# Patient Record
Sex: Female | Born: 1965 | Race: Black or African American | Hispanic: No | Smoking: Former smoker
Health system: Southern US, Community
[De-identification: ages and names within clinical notes are randomized; demographics above are authoritative.]

## PROBLEM LIST (undated history)

## (undated) DIAGNOSIS — I639 Cerebral infarction, unspecified: Secondary | ICD-10-CM

## (undated) DIAGNOSIS — G43909 Migraine, unspecified, not intractable, without status migrainosus: Secondary | ICD-10-CM

## (undated) DIAGNOSIS — M1990 Unspecified osteoarthritis, unspecified site: Secondary | ICD-10-CM

## (undated) DIAGNOSIS — Z8709 Personal history of other diseases of the respiratory system: Secondary | ICD-10-CM

## (undated) DIAGNOSIS — I219 Acute myocardial infarction, unspecified: Secondary | ICD-10-CM

## (undated) DIAGNOSIS — C539 Malignant neoplasm of cervix uteri, unspecified: Secondary | ICD-10-CM

## (undated) DIAGNOSIS — K259 Gastric ulcer, unspecified as acute or chronic, without hemorrhage or perforation: Secondary | ICD-10-CM

## (undated) DIAGNOSIS — G459 Transient cerebral ischemic attack, unspecified: Secondary | ICD-10-CM

## (undated) HISTORY — DX: Personal history of other diseases of the respiratory system: Z87.09

## (undated) HISTORY — DX: Migraine, unspecified, not intractable, without status migrainosus: G43.909

## (undated) HISTORY — DX: Malignant neoplasm of cervix uteri, unspecified: C53.9

## (undated) HISTORY — PX: ABDOMINAL HYSTERECTOMY: SHX81

## (undated) HISTORY — DX: Unspecified osteoarthritis, unspecified site: M19.90

## (undated) HISTORY — DX: Gastric ulcer, unspecified as acute or chronic, without hemorrhage or perforation: K25.9

## (undated) HISTORY — PX: TUBAL LIGATION: SHX77

## (undated) HISTORY — DX: Transient cerebral ischemic attack, unspecified: G45.9

---

## 2003-08-03 ENCOUNTER — Other Ambulatory Visit: Payer: Self-pay

## 2005-09-05 ENCOUNTER — Emergency Department: Payer: Self-pay | Admitting: Emergency Medicine

## 2005-11-29 ENCOUNTER — Emergency Department: Payer: Self-pay | Admitting: Emergency Medicine

## 2006-05-24 ENCOUNTER — Other Ambulatory Visit: Payer: Self-pay

## 2006-05-25 ENCOUNTER — Observation Stay: Payer: Self-pay | Admitting: Internal Medicine

## 2006-05-31 HISTORY — PX: OTHER SURGICAL HISTORY: SHX169

## 2007-10-20 ENCOUNTER — Other Ambulatory Visit: Payer: Self-pay

## 2007-10-20 ENCOUNTER — Emergency Department: Payer: Self-pay | Admitting: Emergency Medicine

## 2008-03-08 ENCOUNTER — Other Ambulatory Visit: Payer: Self-pay

## 2008-03-08 ENCOUNTER — Emergency Department: Payer: Self-pay | Admitting: Emergency Medicine

## 2008-04-04 ENCOUNTER — Emergency Department: Payer: Self-pay | Admitting: Internal Medicine

## 2008-10-13 ENCOUNTER — Emergency Department: Payer: Self-pay | Admitting: Unknown Physician Specialty

## 2008-12-10 ENCOUNTER — Emergency Department: Payer: Self-pay | Admitting: Unknown Physician Specialty

## 2009-04-30 ENCOUNTER — Inpatient Hospital Stay: Payer: Self-pay | Admitting: *Deleted

## 2009-05-06 ENCOUNTER — Emergency Department: Payer: Self-pay | Admitting: Emergency Medicine

## 2009-09-01 ENCOUNTER — Emergency Department: Payer: Self-pay | Admitting: Internal Medicine

## 2009-09-04 ENCOUNTER — Emergency Department: Payer: Self-pay | Admitting: Emergency Medicine

## 2009-11-24 ENCOUNTER — Observation Stay: Payer: Self-pay | Admitting: Internal Medicine

## 2009-12-04 ENCOUNTER — Emergency Department: Payer: Self-pay | Admitting: Emergency Medicine

## 2009-12-17 ENCOUNTER — Ambulatory Visit: Payer: Self-pay | Admitting: Family Medicine

## 2009-12-17 LAB — LIPID PANEL
CHOLESTEROL: 292 mg/dL — AB (ref 0–200)
HDL: 49 mg/dL (ref 35–70)
LDL CALC: 207 mg/dL
Triglycerides: 179 mg/dL — AB (ref 40–160)

## 2009-12-17 LAB — HEPATIC FUNCTION PANEL: ALT: 22 U/L (ref 7–35)

## 2009-12-28 ENCOUNTER — Emergency Department: Payer: Self-pay | Admitting: Emergency Medicine

## 2010-02-21 ENCOUNTER — Emergency Department: Payer: Self-pay | Admitting: Emergency Medicine

## 2010-03-02 HISTORY — PX: CARPAL TUNNEL RELEASE: SHX101

## 2011-02-28 ENCOUNTER — Emergency Department: Payer: Self-pay | Admitting: *Deleted

## 2011-06-07 ENCOUNTER — Emergency Department: Payer: Self-pay | Admitting: Internal Medicine

## 2011-07-05 HISTORY — PX: CARPAL TUNNEL RELEASE: SHX101

## 2011-10-24 ENCOUNTER — Ambulatory Visit: Payer: Self-pay | Admitting: Family Medicine

## 2011-11-03 ENCOUNTER — Encounter: Payer: Self-pay | Admitting: Family Medicine

## 2011-12-03 ENCOUNTER — Encounter: Payer: Self-pay | Admitting: Family Medicine

## 2012-01-02 ENCOUNTER — Encounter: Payer: Self-pay | Admitting: Family Medicine

## 2012-04-18 ENCOUNTER — Emergency Department: Payer: Self-pay | Admitting: Emergency Medicine

## 2012-05-07 ENCOUNTER — Emergency Department: Payer: Self-pay | Admitting: Emergency Medicine

## 2013-01-06 ENCOUNTER — Emergency Department: Payer: Self-pay | Admitting: Emergency Medicine

## 2013-06-03 ENCOUNTER — Emergency Department: Payer: Self-pay | Admitting: Emergency Medicine

## 2013-07-16 ENCOUNTER — Encounter: Payer: Self-pay | Admitting: Pulmonary Disease

## 2013-07-16 ENCOUNTER — Ambulatory Visit (INDEPENDENT_AMBULATORY_CARE_PROVIDER_SITE_OTHER): Payer: BC Managed Care – PPO | Admitting: Pulmonary Disease

## 2013-07-16 VITALS — BP 128/82 | HR 75 | Temp 98.0°F | Ht 61.0 in | Wt 255.0 lb

## 2013-07-16 DIAGNOSIS — J309 Allergic rhinitis, unspecified: Secondary | ICD-10-CM | POA: Insufficient documentation

## 2013-07-16 DIAGNOSIS — K219 Gastro-esophageal reflux disease without esophagitis: Secondary | ICD-10-CM | POA: Insufficient documentation

## 2013-07-16 DIAGNOSIS — J45909 Unspecified asthma, uncomplicated: Secondary | ICD-10-CM | POA: Insufficient documentation

## 2013-07-16 MED ORDER — LANSOPRAZOLE 30 MG PO CPDR: 30.0000 mg | DELAYED_RELEASE_CAPSULE | Freq: Every day | ORAL | Status: DC

## 2013-07-16 MED ORDER — MOMETASONE FUROATE 50 MCG/ACT NA SUSP: 2.0000 | Freq: Every day | NASAL | Status: DC

## 2013-07-16 NOTE — Progress Notes (Signed)
Subjective:    Patient ID: Erin Sherman, female    DOB: 08-25-1965, 48 y.o.   MRN: [id number]  HPI  Erin Sherman is here because of some asthma.  She has been having a lot of time out from work lately because of her asthma problems.  She has been dealing with asthma for years but it has been worse since moving to [place].  She has been having more flare ups in the last year or so.  She has been seen by an allergist since moving here and was told that she had a lot of allergies to grass and trees.  She has been living in [place] for twenty years or more.    She had mild asthma as a child but no other allergies.  By age 66 she started having more symptoms causing her to receive more regular treatment for her asthma.    She lives in an apartment for two years.  No mold mildew or excessive dust in the appartment.  When she was living in a trailer she had much worse symptoms due to mold.   She has a lot of sneezing due to sinus congestion.   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    She has been hospitalized for asthma 5 years ago and had to stay a week.   She knows that she has asthma triggers such as cold weather, changing weather, fresh grass cut will make her more short of breath.  She has had multiple flares of her asthma in the past year, probably 5-6 times a year.  She gets treated with steroids and this has made her gain a lot of weight.    She used to smoke, 1/3 pack per day off and on for 20 years and she quit 5 years ago.    She takes symbicort, one puff twice a day.  When she takes two puffs it helps a little more.  She uses albuterol probably 3 times a day.  She doesn't have to use it at night. She has had nighttime symptoms in the past but that hasn't been a problem lately.   Past Medical History  Diagnosis Date  . Asthma   . Hx of bronchitis   . Sinus drainage   . Arthritis   . Bursitis   . Migraines   . Stomach ulcer       Family History  Problem Relation Age of Onset  . Emphysema Mother   . Emphysema Father   . Asthma Son   . Asthma Sister   . Cancer Maternal Grandfather     prostate  . Cancer Maternal Aunt     breast     History   Social History  . Marital Status: Divorced    Spouse Name: N/A    Number of Children: N/A  . Years of Education: N/A   Occupational History  . Not on file.   Social History Main Topics  . Smoking status: Former Smoker -- 0.30 packs/day for 25 years    Types: Cigarettes    Quit date: 07/16/2008  . Smokeless tobacco: Never Used  . Alcohol Use: Yes     Comment: occasional beer.  . Drug Use: No  . Sexual Activity: Not on file   Other Topics Concern  . Not on file   Social History Narrative  . No narrative on file     Allergies  Allergen Reactions  . Latex  swelling  . Sulfa Antibiotics     Hives, itching     No outpatient prescriptions prior to visit.   No facility-administered medications prior to visit.      Review of Systems  Constitutional: Positive for fatigue. Negative for fever and unexpected weight change.  HENT: Positive for postnasal drip and sinus pressure. Negative for congestion, dental problem, ear pain, nosebleeds, rhinorrhea, sneezing, sore throat and trouble swallowing.   Eyes: Negative for redness and itching.  Respiratory: Positive for chest tightness, shortness of breath and wheezing. Negative for cough.   Cardiovascular: Negative for palpitations and leg swelling.  Gastrointestinal: Negative for nausea and vomiting.  Genitourinary: Negative for dysuria.  Musculoskeletal: Negative for joint swelling.  Skin: Negative for rash.  Neurological: Negative for headaches.  Hematological: Does not bruise/bleed easily.  Psychiatric/Behavioral: Negative for dysphoric mood. The patient is not nervous/anxious.        Objective:   Physical Exam  Filed Vitals:   07/16/13 1629  BP: 128/82  Pulse: 75  Temp: 98 F (36.7 C)   TempSrc: Oral  Height: 5\' 1"  (1.549 m)  Weight: 255 lb (115.667 kg)  SpO2: 97%  RA  Ambulated 500 feet on RA and did not drop O2 saturation below 95%  Gen: well appearing, obese, no acute distress HEENT: NCAT, PERRL, EOMi, OP clear, neck supple without masses PULM: CTA B CV: RRR, no mgr, no JVD AB: BS+, soft, nontender, no hsm Ext: warm, no edema, no clubbing, no cyanosis Derm: no rash or skin breakdown Neuro: A&Ox4, CN II-XII intact, strength 5/5 in all 4 extremities  06/2013 CXR reviewed> no parenchymal abnormality     Assessment & Plan:   GERD (gastroesophageal reflux disease) Her Acid reflux is definitely contributing to the severity of her asthma.  Plan: -change from protonix to prevacid -GERD lifestyle modification reviewed  Asthma, chronic It sounds like Erin Sherman's asthma is at least moderate persistent given her frequent exacerbations, ER visits and frequent albuterol use.  I explained to her that I worry that her poor control is due to any of the following: poor HFA technique, GERD, allergic rhinitis, obesity and possibly or an underlying allergen.  Her acid reflux is definitely contributing to her asthma and has worsened since changing from prevacid to protonix.  Plan: -full PFT pre-post bronchodilator -I educated her today on proper HFA technique -use Symbicort 2 puffs bid -use Symbicort with a spacer -see allergic rhinitis -see GERD -check IgE panel   Allergic rhinitis This is also contributing to her asthma severity.  Plan: -add nasonex, instructed on proper use -add generic Zyrtec   Updated Medication List Outpatient Encounter Prescriptions as of 07/16/2013  Medication Sig  . cholecalciferol (VITAMIN D) 1000 UNITS tablet Take 1,000 Units by mouth daily.  . meloxicam (MOBIC) 15 MG tablet Take 15 mg by mouth as needed.  . montelukast (SINGULAIR) 10 MG tablet Take 10 mg by mouth daily.  . Multiple Vitamins-Minerals (MULTIVITAMIN & MINERAL PO)  Take 1 tablet by mouth daily.  . SYMBICORT 160-4.5 MCG/ACT inhaler Inhale 2 puffs into the lungs 2 (two) times daily.  . [DISCONTINUED] pantoprazole (PROTONIX) 40 MG tablet Take 40 mg by mouth daily.  . lansoprazole (PREVACID) 30 MG capsule Take 1 capsule (30 mg total) by mouth daily.  . mometasone (NASONEX) 50 MCG/ACT nasal spray Place 2 sprays into the nose daily.

## 2013-07-16 NOTE — Assessment & Plan Note (Signed)
It sounds like Erin Sherman's asthma is at least moderate persistent given her frequent exacerbations, ER visits and frequent albuterol use.  I explained to her that I worry that her poor control is due to any of the following: poor HFA technique, GERD, allergic rhinitis, obesity and possibly or an underlying allergen.  Her acid reflux is definitely contributing to her asthma and has worsened since changing from prevacid to protonix.  Plan: -full PFT pre-post bronchodilator -I educated her today on proper HFA technique -use Symbicort 2 puffs bid -use Symbicort with a spacer -see allergic rhinitis -see GERD -check IgE panel

## 2013-07-16 NOTE — Patient Instructions (Addendum)
For your asthma: -use your symbicort with a spacer, 2 puffs twice a day -continue using your albuterol as you are doing -we will set up lung function testing at the hospital  For your allergic rhinitis: -use nasonex 2 sprays each nostril once a day -use generic zyrtec daily  For your GERD: -follow the lifestyle modification instructions we gave you -change the protonix to prevacid (prescription sent)  We will see you back in 6-8 weeks or sooner if needed

## 2013-07-16 NOTE — Assessment & Plan Note (Addendum)
Her Acid reflux is definitely contributing to the severity of her asthma.  Plan: -change from protonix to prevacid -GERD lifestyle modification reviewed

## 2013-07-16 NOTE — Assessment & Plan Note (Signed)
This is also contributing to her asthma severity.  Plan: -add nasonex, instructed on proper use -add generic Zyrtec

## 2013-07-25 ENCOUNTER — Ambulatory Visit: Payer: Self-pay | Admitting: Pulmonary Disease

## 2013-07-25 LAB — PULMONARY FUNCTION TEST

## 2013-07-26 ENCOUNTER — Encounter: Payer: Self-pay | Admitting: Pulmonary Disease

## 2013-07-29 ENCOUNTER — Telehealth: Payer: Self-pay

## 2013-07-29 NOTE — Telephone Encounter (Signed)
LMTCB X1 

## 2013-07-29 NOTE — Telephone Encounter (Signed)
Message copied by Len Blalock on Mon Jul 29, 2013  1:46 PM ------      Message from: Juanito Doom      Created: Fri Jul 26, 2013  8:08 PM       A,            Please let her know that her PFTs looked like asthma            Thanks      b ------

## 2013-08-05 NOTE — Telephone Encounter (Signed)
ATC provided number #[phone], call could not be completed.  LMTCB on home # [id number]

## 2013-08-05 NOTE — Telephone Encounter (Signed)
Returning call can be reached at [phone].Erin Sherman

## 2013-08-05 NOTE — Telephone Encounter (Signed)
LMTCB X2

## 2013-08-26 ENCOUNTER — Encounter: Payer: Self-pay | Admitting: Pulmonary Disease

## 2013-12-22 ENCOUNTER — Emergency Department: Payer: Self-pay | Admitting: Emergency Medicine

## 2013-12-22 LAB — BASIC METABOLIC PANEL
Anion Gap: 7 (ref 7–16)
BUN: 12 mg/dL (ref 7–18)
CALCIUM: 9.2 mg/dL (ref 8.5–10.1)
CREATININE: 0.87 mg/dL (ref 0.60–1.30)
Chloride: 101 mmol/L (ref 98–107)
Co2: 30 mmol/L (ref 21–32)
EGFR (African American): >60
EGFR (Non-African Amer.): >60
Glucose: 95 mg/dL (ref 65–99)
OSMOLALITY: 275 (ref 275–301)
Potassium: 3.8 mmol/L (ref 3.5–5.1)
Sodium: 138 mmol/L (ref 136–145)

## 2013-12-22 LAB — TROPONIN I: Troponin-I: <0.02 ng/mL

## 2013-12-22 LAB — CBC
HCT: 35.7 % (ref 35.0–47.0)
HGB: 11.6 g/dL — AB (ref 12.0–16.0)
MCH: 27.4 pg (ref 26.0–34.0)
MCHC: 32.5 g/dL (ref 32.0–36.0)
MCV: 84 fL (ref 80–100)
Platelet: 292 10*3/uL (ref 150–440)
RBC: 4.23 10*6/uL (ref 3.80–5.20)
RDW: 12.9 % (ref 11.5–14.5)
WBC: 10.1 10*3/uL (ref 3.6–11.0)

## 2013-12-22 LAB — MAGNESIUM: Magnesium: 2.1 mg/dL

## 2013-12-27 ENCOUNTER — Emergency Department: Payer: Self-pay | Admitting: Internal Medicine

## 2014-02-17 ENCOUNTER — Ambulatory Visit: Payer: Self-pay | Admitting: Family Medicine

## 2014-06-19 ENCOUNTER — Emergency Department: Payer: Self-pay | Admitting: Emergency Medicine

## 2014-06-19 LAB — TROPONIN I: Troponin-I: <0.02 ng/mL

## 2014-06-19 LAB — CBC
HCT: 37.2 % (ref 35.0–47.0)
HGB: 11.9 g/dL — AB (ref 12.0–16.0)
MCH: 26.9 pg (ref 26.0–34.0)
MCHC: 32 g/dL (ref 32.0–36.0)
MCV: 84 fL (ref 80–100)
Platelet: 285 10*3/uL (ref 150–440)
RBC: 4.42 10*6/uL (ref 3.80–5.20)
RDW: 12.9 % (ref 11.5–14.5)
WBC: 8.9 10*3/uL (ref 3.6–11.0)

## 2014-06-19 LAB — BASIC METABOLIC PANEL
Anion Gap: 8 (ref 7–16)
BUN: 12 mg/dL (ref 7–18)
Calcium, Total: 9.1 mg/dL (ref 8.5–10.1)
Chloride: 104 mmol/L (ref 98–107)
Co2: 27 mmol/L (ref 21–32)
Creatinine: 0.77 mg/dL (ref 0.60–1.30)
EGFR (African American): >60
EGFR (Non-African Amer.): >60
Glucose: 96 mg/dL (ref 65–99)
Osmolality: 277 (ref 275–301)
Potassium: 3.5 mmol/L (ref 3.5–5.1)
SODIUM: 139 mmol/L (ref 136–145)

## 2014-09-30 ENCOUNTER — Emergency Department: Payer: Self-pay | Admitting: Student

## 2014-09-30 ENCOUNTER — Emergency Department: Payer: Self-pay | Admitting: Emergency Medicine

## 2014-09-30 LAB — BASIC METABOLIC PANEL
Anion Gap: 9 (ref 7–16)
BUN: 16 mg/dL
CALCIUM: 9.2 mg/dL
Chloride: 102 mmol/L
Co2: 26 mmol/L
Creatinine: 0.7 mg/dL
Glucose: 156 mg/dL — ABNORMAL HIGH
Potassium: 4.5 mmol/L
Sodium: 137 mmol/L

## 2014-09-30 LAB — CBC
HCT: 36 % (ref 35.0–47.0)
HGB: 11.7 g/dL — ABNORMAL LOW (ref 12.0–16.0)
MCH: 27.1 pg (ref 26.0–34.0)
MCHC: 32.4 g/dL (ref 32.0–36.0)
MCV: 84 fL (ref 80–100)
PLATELETS: 270 10*3/uL (ref 150–440)
RBC: 4.31 10*6/uL (ref 3.80–5.20)
RDW: 13.4 % (ref 11.5–14.5)
WBC: 7.1 10*3/uL (ref 3.6–11.0)

## 2014-09-30 LAB — PRO B NATRIURETIC PEPTIDE: B-TYPE NATIURETIC PEPTID: 27 pg/mL

## 2014-09-30 LAB — TROPONIN I: Troponin-I: <0.03 ng/mL

## 2014-10-01 LAB — TROPONIN I

## 2015-05-07 DIAGNOSIS — E669 Obesity, unspecified: Secondary | ICD-10-CM | POA: Insufficient documentation

## 2015-05-07 DIAGNOSIS — D4959 Neoplasm of unspecified behavior of other genitourinary organ: Secondary | ICD-10-CM | POA: Insufficient documentation

## 2015-05-07 DIAGNOSIS — R110 Nausea: Secondary | ICD-10-CM | POA: Insufficient documentation

## 2015-05-07 DIAGNOSIS — M25559 Pain in unspecified hip: Secondary | ICD-10-CM | POA: Insufficient documentation

## 2015-05-07 DIAGNOSIS — G5600 Carpal tunnel syndrome, unspecified upper limb: Secondary | ICD-10-CM | POA: Insufficient documentation

## 2015-05-07 DIAGNOSIS — Z8541 Personal history of malignant neoplasm of cervix uteri: Secondary | ICD-10-CM | POA: Insufficient documentation

## 2015-05-07 DIAGNOSIS — R32 Unspecified urinary incontinence: Secondary | ICD-10-CM | POA: Insufficient documentation

## 2015-05-07 DIAGNOSIS — E785 Hyperlipidemia, unspecified: Secondary | ICD-10-CM | POA: Insufficient documentation

## 2015-05-07 DIAGNOSIS — N951 Menopausal and female climacteric states: Secondary | ICD-10-CM | POA: Insufficient documentation

## 2015-05-08 ENCOUNTER — Ambulatory Visit: Payer: Self-pay | Admitting: Family Medicine

## 2015-05-15 ENCOUNTER — Encounter: Payer: Self-pay | Admitting: Family Medicine

## 2015-05-15 ENCOUNTER — Ambulatory Visit (INDEPENDENT_AMBULATORY_CARE_PROVIDER_SITE_OTHER): Payer: BC Managed Care – PPO | Admitting: Family Medicine

## 2015-05-15 VITALS — BP 104/58 | HR 99 | Temp 98.6°F | Resp 16 | Wt 258.0 lb

## 2015-05-15 DIAGNOSIS — J4540 Moderate persistent asthma, uncomplicated: Secondary | ICD-10-CM

## 2015-05-15 DIAGNOSIS — K219 Gastro-esophageal reflux disease without esophagitis: Secondary | ICD-10-CM

## 2015-05-15 DIAGNOSIS — Z23 Encounter for immunization: Secondary | ICD-10-CM | POA: Diagnosis not present

## 2015-05-15 DIAGNOSIS — M25551 Pain in right hip: Secondary | ICD-10-CM

## 2015-05-15 DIAGNOSIS — J309 Allergic rhinitis, unspecified: Secondary | ICD-10-CM

## 2015-05-15 DIAGNOSIS — M51369 Other intervertebral disc degeneration, lumbar region without mention of lumbar back pain or lower extremity pain: Secondary | ICD-10-CM | POA: Insufficient documentation

## 2015-05-15 DIAGNOSIS — M5136 Other intervertebral disc degeneration, lumbar region: Secondary | ICD-10-CM | POA: Insufficient documentation

## 2015-05-15 MED ORDER — BUDESONIDE-FORMOTEROL FUMARATE 160-4.5 MCG/ACT IN AERO: 2.0000 | INHALATION_SPRAY | Freq: Two times a day (BID) | RESPIRATORY_TRACT | Status: DC

## 2015-05-15 MED ORDER — ALBUTEROL SULFATE HFA 108 (90 BASE) MCG/ACT IN AERS: 2.0000 | INHALATION_SPRAY | Freq: Four times a day (QID) | RESPIRATORY_TRACT | Status: DC | PRN

## 2015-05-15 MED ORDER — CEPHALEXIN 500 MG PO CAPS: 500.0000 mg | ORAL_CAPSULE | Freq: Four times a day (QID) | ORAL | Status: AC

## 2015-05-15 MED ORDER — HYDROCODONE-ACETAMINOPHEN 7.5-325 MG PO TABS: 1.0000 | ORAL_TABLET | Freq: Four times a day (QID) | ORAL | Status: DC | PRN

## 2015-05-15 MED ORDER — MOMETASONE FUROATE 50 MCG/ACT NA SUSP: 2.0000 | Freq: Every day | NASAL | Status: DC

## 2015-05-15 NOTE — Progress Notes (Signed)
Patient: Erin Sherman Female    DOB: 1966-06-03   49 y.o.   MRN: [id number] Visit Date: 05/15/2015  Today's Provider: Lelon Huh, MD   Chief Complaint  Patient presents with  . Leg Pain  . Hip Pain  . Asthma   Lump on abdomen  . Allergies   Subjective:    HPI Complains of persistent low back pain radiating into right leg and hip. She had x-rays in 2013 showing DDD. She has been prescribed hydrocodone/apap which she states helps quite a bit, but only needs to take periodiclly.   Asthma follow up: She continues to use Symbicort daily with occasional use of nebulizer and albuterol inhaler. She has occasional flare ups causing her to miss several days of work at a time. She had frequent absences last year and was referred to Dr. Lake Bells, but she feels her asthma is more stable now and does not want to return to pulmonologist. She is due to have FMLA papers completed which she will bring back to office. She needs refills sent to her pharmacy  Allergic rhinitis She has been having much more nasal congestion the last several weeks with post nasal drainage. She has been using OTC nasal decongestants. She was previously prescribed Nasonex which she does not have anymore.   Lump: She reports tender lump draining small amounts of yellow material lower abdomen the last 4-5 days.   Allergies  Allergen Reactions  . Latex     swelling  . Sulfa Antibiotics     Hives, itching   Previous Medications   ALBUTEROL (PROVENTIL HFA) 108 (90 BASE) MCG/ACT INHALER    Inhale 2 puffs into the lungs every 6 (six) hours as needed.   BLACK COHOSH PO    Take by mouth daily.   CETIRIZINE (ZYRTEC) 10 MG TABLET    Take 1 tablet by mouth daily.   CHOLECALCIFEROL (VITAMIN D) 1000 UNITS TABLET    Take 1,000 Units by mouth daily.   EPINEPHRINE (EPIPEN 2-PAK) 0.3 MG/0.3 ML IJ SOAJ INJECTION       HYDROCODONE-ACETAMINOPHEN (NORCO) 7.5-325 MG TABLET    Take 1 tablet by mouth every 6 (six) hours  as needed.   IPRATROPIUM-ALBUTEROL (DUONEB) 0.5-2.5 (3) MG/3ML SOLN    Inhale 1 continuous puffing into the lungs every 6 (six) hours as needed.   LANSOPRAZOLE (PREVACID) 30 MG CAPSULE    Take 1 capsule (30 mg total) by mouth daily.   MELOXICAM (MOBIC) 15 MG TABLET    Take 15 mg by mouth as needed.   MOMETASONE (NASONEX) 50 MCG/ACT NASAL SPRAY    Place 2 sprays into the nose daily.   MONTELUKAST (SINGULAIR) 10 MG TABLET    Take 10 mg by mouth daily.   MULTIPLE VITAMINS-MINERALS (MULTIVITAMIN & MINERAL PO)    Take 1 tablet by mouth daily.   NAPROXEN (NAPROSYN) 500 MG TABLET    Take 1 tablet by mouth 2 (two) times daily as needed.   PANTOPRAZOLE (PROTONIX) 40 MG TABLET    Take 1 tablet by mouth daily.   PROMETHAZINE (PHENERGAN) 25 MG TABLET    Take 1 tablet by mouth every 8 (eight) hours as needed. For nausea   SYMBICORT 160-4.5 MCG/ACT INHALER    Inhale 2 puffs into the lungs 2 (two) times daily.    Review of Systems  Constitutional: Negative.   HENT: Positive for congestion.   Eyes: Negative.   Respiratory: Negative.   Cardiovascular: Negative.   Gastrointestinal:  Negative.   Endocrine: Negative.   Genitourinary: Negative.   Musculoskeletal: Positive for myalgias, back pain, arthralgias, neck pain and neck stiffness.  Allergic/Immunologic: Negative.   Neurological: Negative.   Hematological: Negative.   Psychiatric/Behavioral: Positive for sleep disturbance.    Social History  Substance Use Topics  . Smoking status: Former Smoker -- 0.30 packs/day for 25 years    Types: Cigarettes    Quit date: 07/16/2008  . Smokeless tobacco: Never Used  . Alcohol Use: Yes     Comment: occasional beer.   Objective:   BP 104/58 mmHg  Pulse 99  Temp(Src) 98.6 F (37 C) (Oral)  Resp 16  Wt 258 lb (117.028 kg)  SpO2 97%   Depression screen PHQ 2/9 05/15/2015  Decreased Interest 0  Down, Depressed, Hopeless 0  PHQ - 2 Score 0  Altered sleeping 2  Tired, decreased energy 2  Change in  appetite 0  Feeling bad or failure about yourself  0  Moving slowly or fidgety/restless 0  Suicidal thoughts 0  PHQ-9 Score 4  Difficult doing work/chores Not difficult at all      Physical Exam  General Appearance:    Alert, cooperative, no distress, morbidly obese  HENT:   bilateral TM normal without fluid or infection, neck without nodes, throat normal without erythema or exudate, sinuses nontender, post nasal drip noted and nasal mucosa pale and congested  Eyes:    PERRL, conjunctiva/corneas clear, EOM's intact       Lungs:     Clear to auscultation bilaterally, respirations unlabored  Heart:    Regular rate and rhythm  Neurologic:   Awake, alert, oriented x 3. No apparent focal neurological           defect. No somatosensory deficits.   MS:   Tender lower lumbar spine and right trochanter.        Assessment & Plan:     1. Asthma, chronic, moderate persistent, uncomplicated Stable. Continue current medications. She prefers to get refills here which I agreed to since she has been stable. She will bring back FMLA forms to complete when she gets them from work - albuterol (PROVENTIL HFA) 108 (90 BASE) MCG/ACT inhaler; Inhale 2 puffs into the lungs every 6 (six) hours as needed.  Dispense: 18 g; Refill: 6 - budesonide-formoterol (SYMBICORT) 160-4.5 MCG/ACT inhaler; Inhale 2 puffs into the lungs 2 (two) times daily.  Dispense: 1 Inhaler; Refill: 12  2. Allergic rhinitis, unspecified allergic rhinitis type Start back on nasonex which she states has been effective in the past.  - mometasone (NASONEX) 50 MCG/ACT nasal spray; Place 2 sprays into the nose daily.  Dispense: 17 g; Refill: 2  3. Hip pain, right Tender over greater trochanter. She states hydrocodone works fairly well which was refilled today. Consider steroid injection  4. Gastroesophageal reflux disease without esophagitis Doing well with pantoprazole. Continue current medications.    5. DDD (degenerative disc disease),  lumbar Get back on hydrocodone. Consider referral to physiatry - HYDROcodone-acetaminophen ([place]) 7.5-325 MG tablet; Take 1 tablet by mouth every 6 (six) hours as needed.  Dispense: 30 tablet; Refill: 0  6. Need for pneumococcal vaccination  - Pneumococcal polysaccharide vaccine 23-valent greater than or equal to 2yo subcutaneous/IM   7. Subcutaneous abscess lower abdominal wall - cephalexin 500mg  one four times daily for 10 days Call if symptoms change or if not rapidly improving.   Addressed extensive list of chronic and acute medical problems today requiring extensive time in counseling and  coordination care.  Over half of this 45 minute visit were spent in counseling and coordinating care of multiple medical problems.        Lelon Huh, MD  [facility]

## 2015-05-24 ENCOUNTER — Emergency Department
Admission: EM | Admit: 2015-05-24 | Discharge: 2015-05-24 | Disposition: A | Discharge status: Home / Self Care | Payer: BC Managed Care – PPO | Attending: Emergency Medicine | Admitting: Emergency Medicine

## 2015-05-24 ENCOUNTER — Encounter: Payer: Self-pay | Admitting: Emergency Medicine

## 2015-05-24 DIAGNOSIS — J4541 Moderate persistent asthma with (acute) exacerbation: Secondary | ICD-10-CM

## 2015-05-24 DIAGNOSIS — Z79899 Other long term (current) drug therapy: Secondary | ICD-10-CM | POA: Diagnosis not present

## 2015-05-24 DIAGNOSIS — Z91040 Latex allergy status: Secondary | ICD-10-CM | POA: Diagnosis not present

## 2015-05-24 DIAGNOSIS — Z7951 Long term (current) use of inhaled steroids: Secondary | ICD-10-CM | POA: Diagnosis not present

## 2015-05-24 DIAGNOSIS — R0602 Shortness of breath: Secondary | ICD-10-CM | POA: Diagnosis present

## 2015-05-24 DIAGNOSIS — Z87891 Personal history of nicotine dependence: Secondary | ICD-10-CM | POA: Insufficient documentation

## 2015-05-24 MED ORDER — LIDOCAINE HCL (PF) 1 % IJ SOLN
INTRAMUSCULAR | Status: AC
  Administered 2015-05-24: 5 mL
  Filled 2015-05-24: qty 5

## 2015-05-24 MED ORDER — IPRATROPIUM-ALBUTEROL 0.5-2.5 (3) MG/3ML IN SOLN
3.0000 mL | Freq: Once | RESPIRATORY_TRACT | Status: AC
  Administered 2015-05-24: 3 mL via RESPIRATORY_TRACT
  Filled 2015-05-24: qty 3

## 2015-05-24 MED ORDER — GUAIFENESIN 100 MG/5ML PO SYRP: 200.0000 mg | ORAL_SOLUTION | Freq: Three times a day (TID) | ORAL | Status: DC | PRN

## 2015-05-24 MED ORDER — PREDNISONE 20 MG PO TABS: ORAL_TABLET | ORAL | Status: DC

## 2015-05-24 MED ORDER — OPTICHAMBER ADVANTAGE MISC: 1.0000 | Freq: Once | Status: AC

## 2015-05-24 MED ORDER — PREDNISONE 20 MG PO TABS
60.0000 mg | ORAL_TABLET | Freq: Once | ORAL | Status: AC
  Administered 2015-05-24: 60 mg via ORAL
  Filled 2015-05-24: qty 3

## 2015-05-24 NOTE — Discharge Instructions (Signed)
Take prednisone as prescribed. Continue to use her albuterol nebulizer treatments if needed. Between, you may use the albuterol inhaler. Use a spacer. You may repeat the 2 puffs if needed. Albuterol wears off an approximately 4-6 hours. You may repeat the doses at that time as well if needed. Meanwhile, the steroid inhaler recently prescribed should be continued for that you feel the benefits soon. Follow-up with your regular doctor. Return to emergency department if you have worsening shortness of breath or if you have other urgent concerns.  Asthma, Adult Asthma is a condition of the lungs in which the airways tighten and narrow. Asthma can make it hard to breathe. Asthma cannot be cured, but medicine and lifestyle changes can help control it. Asthma may be started (triggered) by:  Animal skin flakes (dander).  Dust.  Cockroaches.  Pollen.  Mold.  Smoke.  Cleaning products.  Hair sprays or aerosol sprays.  Paint fumes or strong smells.  Cold air, weather changes, and winds.  Crying or laughing hard.  Stress.  Certain medicines or drugs.  Foods, such as dried fruit, potato chips, and sparkling grape juice.  Infections or conditions (colds, flu).  Exercise.  Certain medical conditions or diseases.  Exercise or tiring activities. HOME CARE   Take medicine as told by your doctor.  Use a peak flow meter as told by your doctor. A peak flow meter is a tool that measures how well the lungs are working.  Record and keep track of the peak flow meter's readings.  Understand and use the asthma action plan. An asthma action plan is a written plan for taking care of your asthma and treating your attacks.  To help prevent asthma attacks:  Do not smoke. Stay away from secondhand smoke.  Change your heating and air conditioning filter often.  Limit your use of fireplaces and wood stoves.  Get rid of pests (such as roaches and mice) and their droppings.  Throw away plants  if you see mold on them.  Clean your floors. Dust regularly. Use cleaning products that do not smell.  Have someone vacuum when you are not home. Use a vacuum cleaner with a HEPA filter if possible.  Replace carpet with wood, tile, or vinyl flooring. Carpet can trap animal skin flakes and dust.  Use allergy-proof pillows, mattress covers, and box spring covers.  Wash bed sheets and blankets every week in hot water and dry them in a dryer.  Use blankets that are made of polyester or cotton.  Clean bathrooms and kitchens with bleach. If possible, have someone repaint the walls in these rooms with mold-resistant paint. Keep out of the rooms that are being cleaned and painted.  Wash hands often. GET HELP IF:  You have make a whistling sound when breaking (wheeze), have shortness of breath, or have a cough even if taking medicine to prevent attacks.  The colored mucus you cough up (sputum) is thicker than usual.  The colored mucus you cough up changes from clear or white to yellow, green, gray, or bloody.  You have problems from the medicine you are taking such as:  A rash.  Itching.  Swelling.  Trouble breathing.  You need reliever medicines more than 2-3 times a week.  Your peak flow measurement is still at 50-79% of your personal best after following the action plan for 1 hour.  You have a fever. GET HELP RIGHT AWAY IF:   You seem to be worse and are not responding to medicine during  an asthma attack.  You are short of breath even at rest.  You get short of breath when doing very little activity.  You have trouble eating, drinking, or talking.  You have chest pain.  You have a fast heartbeat.  Your lips or fingernails start to turn blue.  You are light-headed, dizzy, or faint.  Your peak flow is less than 50% of your personal best.   This information is not intended to replace advice given to you by your health care provider. Make sure you discuss any  questions you have with your health care provider.   Document Released: 12/07/2007 Document Revised: 03/11/2015 Document Reviewed: 01/17/2013 Elsevier Interactive Patient Education [employer].

## 2015-05-24 NOTE — ED Provider Notes (Signed)
[facility] Emergency Department Provider Note  ____________________________________________  Time seen: 1253   I have reviewed the triage vital signs and the nursing notes.   HISTORY  Chief Complaint Shortness of Breath     HPI Erin Sherman is a 49 y.o. female with history of asthma. Her symptoms have worsened over the past week. She went her primary physician approximately week ago and had a steroid inhaler added. She still feels tight and having shortness of breath. She uses her albuterol inhaler 2 puffs twice a day, which is likely an adequate for her current symptoms. With the steroid inhaler just having been prescribed, it is likely not therapeutic yet.  Patient denies any fever or chest pain. She does report ongoing tightness.  She reports having to use higher doses of steroids in the past and reports she needs higher doses then standard. She reluctantly is asking for 60 mg per day over the next few days and then a taper.   Past Medical History  Diagnosis Date  . Asthma   . Hx of bronchitis   . Sinus drainage   . Arthritis   . Bursitis   . Migraines   . Stomach ulcer     Patient Active Problem List   Diagnosis Date Noted  . DDD (degenerative disc disease), lumbar 05/15/2015  . Carpal tunnel syndrome 05/07/2015  . Hip pain 05/07/2015  . History of cervical cancer 05/07/2015  . HLD (hyperlipidemia) 05/07/2015  . Menopausal symptom 05/07/2015  . Obesity 05/07/2015  . 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 Urinary incontinence 05/07/2015  . Nausea 05/07/2015  . Asthma, chronic 07/16/2013  . Allergic rhinitis 07/16/2013  . GERD (gastroesophageal reflux disease) 07/16/2013    Past Surgical History  Procedure Laterality Date  . Carpal tunnel release Right 2013  . Cesarean section    . Carpal tunnel release  03/02/2010    Endoscopic carpal tunnel release. Rica Mast, MD (Orthopedic, [facility])  . Tubal ligation    . Abdominal hysterectomy      with unilateral oophorectomy and part of cervix removed due to cervical cancer  . Exercise treadmill test  05/31/2006    Normal    Current Outpatient Rx  Name  Route  Sig  Dispense  Refill  . albuterol (PROVENTIL HFA) 108 (90 BASE) MCG/ACT inhaler   Inhalation   Inhale 2 puffs into the lungs every 6 (six) hours as needed.   18 g   6   . BLACK COHOSH PO   Oral   Take by mouth daily.         . budesonide-formoterol (SYMBICORT) 160-4.5 MCG/ACT inhaler   Inhalation   Inhale 2 puffs into the lungs 2 (two) times daily.   1 Inhaler   12   . cetirizine (ZYRTEC) 10 MG tablet   Oral   Take 1 tablet by mouth daily.         . cholecalciferol (VITAMIN D) 1000 UNITS tablet   Oral   Take 1,000 Units by mouth daily.         Marland Kitchen EPINEPHrine (EPIPEN 2-PAK) 0.3 mg/0.3 mL IJ SOAJ injection               . HYDROcodone-acetaminophen (NORCO) 7.5-325 MG tablet   Oral   Take 1 tablet by mouth every 6 (six) hours as needed.   30 tablet   0   . ipratropium-albuterol (DUONEB) 0.5-2.5 (3) MG/3ML SOLN   Inhalation   Inhale 1 continuous puffing into the  lungs every 6 (six) hours as needed.         Marland Kitchen EXPIRED: lansoprazole (PREVACID) 30 MG capsule   Oral   Take 1 capsule (30 mg total) by mouth daily.   30 capsule   1   . meloxicam (MOBIC) 15 MG tablet   Oral   Take 15 mg by mouth as needed.         . mometasone (NASONEX) 50 MCG/ACT nasal spray   Nasal   Place 2 sprays into the nose daily.   17 g   2   . montelukast (SINGULAIR) 10 MG tablet   Oral   Take 10 mg by mouth daily.         . Multiple Vitamins-Minerals (MULTIVITAMIN & MINERAL PO)   Oral   Take 1 tablet by mouth daily.         . naproxen (NAPROSYN) 500 MG tablet   Oral   Take 1 tablet by mouth 2 (two) times daily as needed.         . pantoprazole (PROTONIX) 40 MG tablet   Oral   Take 1 tablet by mouth daily.         . predniSONE (DELTASONE) 20 MG tablet      Take 3 tablets a day for 2 days then  2 tablets a day for 3 days then 1 tablet a day for 3 days.   15 tablet   0   . promethazine (PHENERGAN) 25 MG tablet   Oral   Take 1 tablet by mouth every 8 (eight) hours as needed. For nausea         . Spacer/Aero-Holding Chambers (OPTICHAMBER ADVANTAGE) MISC   Other   1 each by Other route once. Always uses her when you're using a metered-dose inhaler. You've aromatase medicine as much, he won't have his much side effect, but you it twice as much medicine and your lungs.   1 each   0     Allergies Latex and Sulfa antibiotics  Family History  Problem Relation Age of Onset  . Emphysema Mother   . Asthma Mother   . Diabetes Mother     type 2  . COPD Mother   . Hypertension Mother   . Emphysema Father   . Hypertension Father   . COPD Father   . Asthma Son   . Asthma Sister   . Cancer Maternal Grandfather     prostate  . Cancer Maternal Aunt     breast  . Asthma Brother   . Heart attack Brother   . Heart attack Maternal Grandmother   . Heart attack Paternal Grandmother   . Anemia Sister   . Cancer Other     Social History Social History  Substance Use Topics  . Smoking status: Former Smoker -- 0.30 packs/day for 25 years    Types: Cigarettes    Quit date: 07/16/2008  . Smokeless tobacco: Never Used  . Alcohol Use: Yes     Comment: occasional beer.    Review of Systems  Constitutional: Negative for fatigue. ENT: Negative for congestion. Cardiovascular: Negative for chest pain. Respiratory: Asthma. See history of present illness. Gastrointestinal: Negative for abdominal pain, vomiting and diarrhea. Genitourinary: Negative for dysuria. Musculoskeletal: No myalgias or injuries. Skin: Negative for rash. Neurological: Negative for headache or focal weakness   10-point ROS otherwise negative.  ____________________________________________   PHYSICAL EXAM:  VITAL SIGNS: ED Triage Vitals  Enc Vitals Group     BP 05/24/15  1236 96/84 mmHg     Pulse  Rate 05/24/15 1236 96     Resp 05/24/15 1236 23     Temp --      Temp src --      SpO2 05/24/15 1236 98 %     Weight --      Height --      Head Cir --      Peak Flow --      Pain Score 05/24/15 1237 10     Pain Loc --      Pain Edu? --      Excl. in [place]? --     Constitutional:  Alert and oriented. Overall appears to be breathing comfortably and in no acute distress.Marland Kitchen ENT   Head: Normocephalic and atraumatic.   Nose: No congestion/rhinnorhea.       Mouth: No erythema, no swelling   Cardiovascular: Normal rate, regular rhythm, no murmur noted Respiratory:  Slight appearance of increased work of breathing., no tachypnea.    Breath sounds are clear and equal bilaterally.  Gastrointestinal: Soft and nontender. No distention.  Back: No muscle spasm, no tenderness, no CVA tenderness. Musculoskeletal: No deformity noted. Nontender with normal range of motion in all extremities.  No noted edema. Neurologic:  Communicative. Normal appearing spontaneous movement in all 4 extremities. No gross focal neurologic deficits are appreciated.  Skin:  Skin is warm, dry. No rash noted. Psychiatric: Mood and affect are normal. Speech and behavior are normal.  ____________________________________________   INITIAL IMPRESSION / ASSESSMENT AND PLAN / ED COURSE  Pertinent labs & imaging results that were available during my care of the patient were reviewed by me and considered in my medical decision making (see chart for details).  49 year old female with a history of asthma, now with worsening symptoms. She reports is not unusual for this time of year. We will treat her with DuoNeb currently. I have shared with the patient by history of asthma, that was worse in the fall. She then asked me how I got better. I told her little bit of prior information about what I think helped, including nebulized lidocaine at one point. I have not encouraged the patient to utilize this off label treatment, but she  would like to try it. With her expression of interest, I reviewed Pros and cons of this treatment and how I have minimal expectations for improvement. I also believe there are minimal downsides. We will include 1% lidocaine in her nebulizer treatment. Of note, this is much lower percentage than what would be used prior to bronchoscopy or other situations were lidocaine is more conventionally nebulized.  We will also treat her with prednisone at the higher doses as she advises.  ----------------------------------------- 3:14 PM on 05/24/2015 -----------------------------------------  Patient is breathing better. She feels comfortable. We've reviewed treatment with steroids and the dosing. She has asked for a prescription for a cough syrup. I've asked her to follow up with her primary physicians and return if she has any worsening problems.    ____________________________________________   FINAL CLINICAL IMPRESSION(S) / ED DIAGNOSES  Final diagnoses:  Asthma, moderate persistent, with acute exacerbation      Ahmed Prima, MD 05/24/15 1515

## 2015-05-24 NOTE — ED Notes (Signed)
Having some diff breathing ..wheezing

## 2015-05-24 NOTE — ED Notes (Signed)
NAD noted at this time. Pt refused wheelchair to the lobby. Denies comments/concerns at this time.

## 2015-05-24 NOTE — ED Notes (Signed)
Pt sitting up in bed at this time with family at bedside. NAD noted at this time. Pt eating cheetos at time of assessment. Denies needs at this time.

## 2015-05-24 NOTE — ED Notes (Signed)
EDP at bedside at this time.  

## 2015-07-01 ENCOUNTER — Encounter: Payer: Self-pay | Admitting: Family Medicine

## 2015-07-01 ENCOUNTER — Ambulatory Visit (INDEPENDENT_AMBULATORY_CARE_PROVIDER_SITE_OTHER): Payer: BC Managed Care – PPO | Admitting: Family Medicine

## 2015-07-01 VITALS — BP 108/62 | HR 95 | Temp 98.1°F | Resp 16 | Ht 60.0 in | Wt 262.0 lb

## 2015-07-01 DIAGNOSIS — M25551 Pain in right hip: Secondary | ICD-10-CM | POA: Diagnosis not present

## 2015-07-01 DIAGNOSIS — M545 Low back pain, unspecified: Secondary | ICD-10-CM | POA: Insufficient documentation

## 2015-07-01 DIAGNOSIS — E669 Obesity, unspecified: Secondary | ICD-10-CM | POA: Diagnosis not present

## 2015-07-01 DIAGNOSIS — M7061 Trochanteric bursitis, right hip: Secondary | ICD-10-CM | POA: Insufficient documentation

## 2015-07-01 DIAGNOSIS — N951 Menopausal and female climacteric states: Secondary | ICD-10-CM

## 2015-07-01 DIAGNOSIS — J309 Allergic rhinitis, unspecified: Secondary | ICD-10-CM

## 2015-07-01 HISTORY — DX: Trochanteric bursitis, right hip: M70.61

## 2015-07-01 MED ORDER — METHYLPREDNISOLONE ACETATE 80 MG/ML IJ SUSP: 80.0000 mg | Freq: Once | INTRAMUSCULAR | Status: DC

## 2015-07-01 MED ORDER — ESTROGENS CONJUGATED 0.625 MG PO TABS: 0.6250 mg | ORAL_TABLET | Freq: Every day | ORAL | Status: DC

## 2015-07-01 MED ORDER — MELOXICAM 15 MG PO TABS: 15.0000 mg | ORAL_TABLET | Freq: Every day | ORAL | Status: DC | PRN

## 2015-07-01 MED ORDER — CETIRIZINE-PSEUDOEPHEDRINE ER 5-120 MG PO TB12: 1.0000 | ORAL_TABLET | Freq: Every day | ORAL | Status: DC

## 2015-07-01 MED ORDER — PHENTERMINE HCL 37.5 MG PO CAPS: 37.5000 mg | ORAL_CAPSULE | ORAL | Status: DC

## 2015-07-01 NOTE — Progress Notes (Signed)
Patient: Erin Sherman Female    DOB: 15-Aug-1965   49 y.o.   MRN: [id number] Visit Date: 07/01/2015  Today's Provider: Lelon Huh, MD   Chief Complaint  Patient presents with  . Back Pain  . Hip Pain   Subjective:    Back Pain This is a chronic problem. The current episode started more than 1 year ago. The problem occurs intermittently. The problem has been gradually worsening since onset. The pain is present in the lumbar spine. The symptoms are aggravated by sitting (cold weather). Associated symptoms include leg pain and numbness (in right leg). Pertinent negatives include no abdominal pain, bladder incontinence, bowel incontinence, chest pain, dysuria, fever, tingling or weakness. (Right hip pain also) Treatments tried: OTC pain patches. The treatment provided mild relief.  Hip Pain  The pain is present in the right hip. Associated symptoms include numbness (in right leg). Pertinent negatives include no tingling.  She takes up to 800mg  ibuprofen which is no longer working. She has previously tried naprosyn which she states did not help.    Obesity She was previously treated with phentermine which she would like to restart. She has been trying to cut back on calories. She feels she gets plenty of exercise since she walks all day on her job.   Hot flashes She requests hormone treatment for severe hot flashes which she has been having for the last year. She has tried [employer], but this has not been effective. She states she has had a hysterectomy in 1999.     Allergies  Allergen Reactions  . Latex     swelling  . Sulfa Antibiotics     Hives, itching   Previous Medications   ALBUTEROL (PROVENTIL HFA) 108 (90 BASE) MCG/ACT INHALER    Inhale 2 puffs into the lungs every 6 (six) hours as needed.   BLACK COHOSH PO    Take by mouth daily. Reported on 07/01/2015   BUDESONIDE-FORMOTEROL (SYMBICORT) 160-4.5 MCG/ACT INHALER    Inhale 2 puffs into the lungs 2  (two) times daily.   CALCIUM CARBONATE (TUMS) 500 MG CHEWABLE TABLET    Chew 1 tablet by mouth daily.   CETIRIZINE-PSEUDOEPHEDRINE (ZYRTEC-D) 5-120 MG TABLET    Take 1 tablet by mouth daily.   CHOLECALCIFEROL (VITAMIN D) 1000 UNITS TABLET    Take 1,000 Units by mouth daily.   EPINEPHRINE (EPIPEN 2-PAK) 0.3 MG/0.3 ML IJ SOAJ INJECTION       GUAIFENESIN (TUSSIN) 100 MG/5ML SYRUP    Take 10 mLs (200 mg total) by mouth 3 (three) times daily as needed for cough.   HYDROCODONE-ACETAMINOPHEN (NORCO) 7.5-325 MG TABLET    Take 1 tablet by mouth every 6 (six) hours as needed.   IPRATROPIUM-ALBUTEROL (DUONEB) 0.5-2.5 (3) MG/3ML SOLN    Inhale 1 continuous puffing into the lungs every 6 (six) hours as needed.   MOMETASONE (NASONEX) 50 MCG/ACT NASAL SPRAY    Place 2 sprays into the nose daily.   MULTIPLE VITAMINS-MINERALS (MULTIVITAMIN & MINERAL PO)    Take 1 tablet by mouth daily.   NAPROXEN (NAPROSYN) 500 MG TABLET    Take 1 tablet by mouth 2 (two) times daily as needed.   PREDNISONE (DELTASONE) 20 MG TABLET    Take 3 tablets a day for 2 days then 2 tablets a day for 3 days then 1 tablet a day for 3 days.   PROMETHAZINE (PHENERGAN) 25 MG TABLET    Take 1 tablet by  mouth every 8 (eight) hours as needed. For nausea   SPACER/AERO-HOLDING CHAMBERS (OPTICHAMBER ADVANTAGE) MISC    1 each by Other route once. Always uses her when you're using a metered-dose inhaler. You've aromatase medicine as much, he won't have his much side effect, but you it twice as much medicine and your lungs.    Review of Systems  Constitutional: Negative for fever.  Cardiovascular: Negative for chest pain.  Gastrointestinal: Negative for abdominal pain and bowel incontinence.  Genitourinary: Negative for bladder incontinence and dysuria.  Musculoskeletal: Positive for back pain.  Neurological: Positive for numbness (in right leg). Negative for tingling and weakness.    Social History  Substance Use Topics  . Smoking status:  Former Smoker -- 0.30 packs/day for 25 years    Types: Cigarettes    Quit date: 07/16/2008  . Smokeless tobacco: Never Used  . Alcohol Use: Yes     Comment: occasional beer.   Objective:   BP 108/62 mmHg  Pulse 95  Temp(Src) 98.1 F (36.7 C) (Oral)  Resp 16  Ht 5' (1.524 m)  Wt 262 lb (118.842 kg)  BMI 51.17 kg/m2  SpO2 97%  Physical Exam  General Appearance:    Alert, cooperative, no distress, morbidly obese  Eyes:    PERRL, conjunctiva/corneas clear, EOM's intact       Lungs:     Clear to auscultation bilaterally, respirations unlabored  Heart:    Regular rate and rhythm  Neurologic:   Awake, alert, oriented x 3. No apparent focal neurological           defect.   MS:    Point tenderness over right greater trochanter. Mild tenderness over lower lumbar spine.        Assessment & Plan:     1. Hip pain, right   2. Trochanteric bursitis of right hip Prepped skin over greater trochanter with isopropyl alcohol and injected - methylPREDNISolone acetate (DEPO-MEDROL) injection 80 mg; 1 mL (80 mg total)  Over greater trochanter   3. Midline low back pain without sciatica  - meloxicam (MOBIC) 15 MG tablet; Take 1 tablet (15 mg total) by mouth daily as needed (back pain).  Dispense: 30 tablet; Refill: 1  4. Allergic rhinitis, unspecified allergic rhinitis type Advised her that Zyrtec can not be taking with Phentermine, which she takes intermittently. She may continue to use fluticasone nasal spray while on phentermine - cetirizine-pseudoephedrine (ZYRTEC-D) 5-120 MG tablet; Take 1 tablet by mouth daily. As needed for allergies  Dispense: 30 tablet; Refill: 6  5. Menopausal symptom Is s/p abdominal hysterectomy - estrogens, conjugated, (PREMARIN) 0.625 MG tablet; Take 1 tablet (0.625 mg total) by mouth daily. Take daily for 21 days then do not take for 7 days.  Dispense: 30 tablet; Refill: 6  6. Obesity  - phentermine 37.5 MG capsule; Take 1 capsule (37.5 mg total) by mouth  every morning. DO NOT TAKE THIS MEDICATION WHEN TAKING ZYRTEC-D  Dispense: 30 capsule; Refill: 3         Lelon Huh, MD  [facility]

## 2015-07-02 ENCOUNTER — Encounter: Payer: Self-pay | Admitting: Family Medicine

## 2015-11-17 ENCOUNTER — Encounter: Payer: Self-pay | Admitting: Family Medicine

## 2015-11-17 ENCOUNTER — Ambulatory Visit: Payer: BC Managed Care – PPO | Admitting: Family Medicine

## 2015-11-17 ENCOUNTER — Ambulatory Visit (INDEPENDENT_AMBULATORY_CARE_PROVIDER_SITE_OTHER): Payer: BC Managed Care – PPO | Admitting: Family Medicine

## 2015-11-17 VITALS — BP 122/70 | HR 96 | Resp 16 | Ht 60.0 in | Wt 262.0 lb

## 2015-11-17 DIAGNOSIS — J4540 Moderate persistent asthma, uncomplicated: Secondary | ICD-10-CM

## 2015-11-17 DIAGNOSIS — M7061 Trochanteric bursitis, right hip: Secondary | ICD-10-CM

## 2015-11-17 DIAGNOSIS — L299 Pruritus, unspecified: Secondary | ICD-10-CM | POA: Insufficient documentation

## 2015-11-17 MED ORDER — ALBUTEROL SULFATE HFA 108 (90 BASE) MCG/ACT IN AERS: 2.0000 | INHALATION_SPRAY | Freq: Four times a day (QID) | RESPIRATORY_TRACT | Status: DC | PRN

## 2015-11-17 MED ORDER — MONTELUKAST SODIUM 10 MG PO TABS: 10.0000 mg | ORAL_TABLET | Freq: Every day | ORAL | Status: DC

## 2015-11-17 NOTE — Progress Notes (Signed)
Patient: Erin Sherman Female    DOB: 01-Nov-1965   50 y.o.   MRN: [id number] Visit Date: 11/17/2015  Today's Provider: Lelon Huh, MD   Chief Complaint  Patient presents with  . Hip Pain  . Knee Pain   Subjective:    HPI  Follow-up for right  hip pain from 07/01/2015; patient was given 80 mg depo-medrol injection.   Patient's right hip and knee continue to cause her pian.  Patient has had itching all over her body since Sunday 11/15/15 after visiting [place] last week. Has taken Benadryl and Epipen which has provided temporary relief.     Allergies  Allergen Reactions  . Latex     swelling  . Sulfa Antibiotics     Hives, itching   Previous Medications   ALBUTEROL (PROVENTIL HFA) 108 (90 BASE) MCG/ACT INHALER    Inhale 2 puffs into the lungs every 6 (six) hours as needed.   BLACK COHOSH PO    Take by mouth daily. Reported on 07/01/2015   BUDESONIDE-FORMOTEROL (SYMBICORT) 160-4.5 MCG/ACT INHALER    Inhale 2 puffs into the lungs 2 (two) times daily.   CALCIUM CARBONATE (TUMS) 500 MG CHEWABLE TABLET    Chew 1 tablet by mouth daily.   CETIRIZINE-PSEUDOEPHEDRINE (ZYRTEC-D) 5-120 MG TABLET    Take 1 tablet by mouth daily. As needed for allergies   CHOLECALCIFEROL (VITAMIN D) 1000 UNITS TABLET    Take 1,000 Units by mouth daily.   EPINEPHRINE (EPIPEN 2-PAK) 0.3 MG/0.3 ML IJ SOAJ INJECTION       ESTROGENS, CONJUGATED, (PREMARIN) 0.625 MG TABLET    Take 1 tablet (0.625 mg total) by mouth daily. Take daily for 21 days then do not take for 7 days.   IPRATROPIUM-ALBUTEROL (DUONEB) 0.5-2.5 (3) MG/3ML SOLN    Inhale 1 continuous puffing into the lungs every 6 (six) hours as needed.   MOMETASONE (NASONEX) 50 MCG/ACT NASAL SPRAY    Place 2 sprays into the nose daily.   MULTIPLE VITAMINS-MINERALS (MULTIVITAMIN & MINERAL PO)    Take 1 tablet by mouth daily. Reported on 11/17/2015   PHENTERMINE 37.5 MG CAPSULE    Take 1 capsule (37.5 mg total) by mouth every morning. DO NOT TAKE  THIS MEDICATION WHEN TAKING ZYRTEC-D   PROMETHAZINE (PHENERGAN) 25 MG TABLET    Take 1 tablet by mouth every 8 (eight) hours as needed. For nausea   SPACER/AERO-HOLDING CHAMBERS (OPTICHAMBER ADVANTAGE) MISC    1 each by Other route once. Always uses her when you're using a metered-dose inhaler. You've aromatase medicine as much, he won't have his much side effect, but you it twice as much medicine and your lungs.    Review of Systems  Constitutional: Negative for fever, chills, appetite change and fatigue.  Respiratory: Negative for chest tightness and shortness of breath.   Cardiovascular: Negative for chest pain and palpitations.  Gastrointestinal: Negative for nausea, vomiting and abdominal pain.  Musculoskeletal: Positive for myalgias.  Allergic/Immunologic: Positive for environmental allergies.  Neurological: Negative for dizziness and weakness.    Social History  Substance Use Topics  . Smoking status: Former Smoker -- 0.30 packs/day for 25 years    Types: Cigarettes    Quit date: 07/16/2008  . Smokeless tobacco: Never Used  . Alcohol Use: Yes     Comment: occasional beer.   Objective:   BP 122/70 mmHg  Pulse 96  Resp 16  Ht 5' (1.524 m)  Wt 262 lb (118.842 kg)  BMI 51.17 kg/m2  SpO2 96%  Physical Exam   General Appearance:    Alert, cooperative, no distress, obese  Eyes:    PERRL, conjunctiva/corneas clear, EOM's intact       Lungs:     Clear to auscultation bilaterally, respirations unlabored  Heart:    Regular rate and rhythm  Neurologic:   Awake, alert, oriented x 3. No apparent focal neurological           defect.   MS:  Tender right greater trochanter.        Assessment & Plan:     1. Trochanteric bursitis of right hip 80mg  Depot-medrol injected over right greater trochanter  2. Itching  - montelukast (SINGULAIR) 10 MG tablet; Take 1 tablet (10 mg total) by mouth at bedtime.  Dispense: 30 tablet; Refill: 3  3. Asthma, chronic, moderate persistent,  uncomplicated  - montelukast (SINGULAIR) 10 MG tablet; Take 1 tablet (10 mg total) by mouth at bedtime.  Dispense: 30 tablet; Refill: 3 - albuterol (PROVENTIL HFA) 108 (90 Base) MCG/ACT inhaler; Inhale 2 puffs into the lungs every 6 (six) hours as needed.  Dispense: 18 g; Refill: 6       Lelon Huh, MD  [facility]

## 2015-11-18 ENCOUNTER — Telehealth: Payer: Self-pay | Admitting: *Deleted

## 2015-11-18 MED ORDER — DOXYCYCLINE HYCLATE 100 MG PO TABS: 100.0000 mg | ORAL_TABLET | Freq: Two times a day (BID) | ORAL | Status: DC

## 2015-11-18 NOTE — Telephone Encounter (Signed)
Please review-aa 

## 2015-11-18 NOTE — Telephone Encounter (Signed)
Pt is returning call.  [id number]

## 2015-11-18 NOTE — Telephone Encounter (Signed)
Patient called office requesting an antibiotic to help with her itching. Patient was seen in office yesterday. Please advise?

## 2015-11-18 NOTE — Telephone Encounter (Signed)
Advised patient as below. Patient reports that she picked up Singulair 10mg  yesterday. Sent in Doxy only into the pharmacy.

## 2015-11-18 NOTE — Telephone Encounter (Signed)
Sent to the wrong provider at first=aa

## 2015-11-18 NOTE — Telephone Encounter (Signed)
Start doxycycline 100mg  twice a day for 10 days, #10 and montelukast 10mg  a day, #30, no refills.

## 2015-12-28 ENCOUNTER — Ambulatory Visit (INDEPENDENT_AMBULATORY_CARE_PROVIDER_SITE_OTHER): Payer: BC Managed Care – PPO | Admitting: Physician Assistant

## 2015-12-28 ENCOUNTER — Encounter: Payer: Self-pay | Admitting: Physician Assistant

## 2015-12-28 VITALS — BP 112/70 | HR 90 | Temp 98.0°F | Resp 16 | Wt 258.0 lb

## 2015-12-28 DIAGNOSIS — J4541 Moderate persistent asthma with (acute) exacerbation: Secondary | ICD-10-CM | POA: Diagnosis not present

## 2015-12-28 DIAGNOSIS — J0101 Acute recurrent maxillary sinusitis: Secondary | ICD-10-CM | POA: Diagnosis not present

## 2015-12-28 DIAGNOSIS — M7061 Trochanteric bursitis, right hip: Secondary | ICD-10-CM

## 2015-12-28 DIAGNOSIS — M5431 Sciatica, right side: Secondary | ICD-10-CM | POA: Diagnosis not present

## 2015-12-28 DIAGNOSIS — M5136 Other intervertebral disc degeneration, lumbar region: Secondary | ICD-10-CM

## 2015-12-28 MED ORDER — IPRATROPIUM-ALBUTEROL 0.5-2.5 (3) MG/3ML IN SOLN: 3.0000 mL | Freq: Four times a day (QID) | RESPIRATORY_TRACT | Status: DC | PRN

## 2015-12-28 MED ORDER — HYDROCODONE-ACETAMINOPHEN 5-325 MG PO TABS: 1.0000 | ORAL_TABLET | Freq: Four times a day (QID) | ORAL | Status: DC | PRN

## 2015-12-28 NOTE — Patient Instructions (Signed)
Degenerative Disk Disease  Degenerative disk disease is a condition caused by the changes that occur in spinal disks as you grow older. Spinal disks are soft and compressible disks located between the bones of your spine (vertebrae). These disks act like shock absorbers. Degenerative disk disease can affect the whole spine. However, the neck and lower back are most commonly affected. Many changes can occur in the spinal disks with aging, such as:  · The spinal disks may dry and shrink.  · Small tears may occur in the tough, outer covering of the disk (annulus).  · The disk space may become smaller due to loss of water.  · Abnormal growths in the bone (spurs) may occur. This can put pressure on the nerve roots exiting the spinal canal, causing pain.  · The spinal canal may become narrowed.  RISK FACTORS   · Being overweight.  · Having a family history of degenerative disk disease.  · Smoking.  · There is increased risk if you are doing heavy lifting or have a sudden injury.  SIGNS AND SYMPTOMS   Symptoms vary from person to person and may include:  · Pain that varies in intensity. Some people have no pain, while others have severe pain. The location of the pain depends on the part of your backbone that is affected.  ¨ You will have neck or arm pain if a disk in the neck area is affected.  ¨ You will have pain in your back, buttocks, or legs if a disk in the lower back is affected.  · Pain that becomes worse while bending, reaching up, or with twisting movements.  · Pain that may start gradually and then get worse as time passes. It may also start after a major or minor injury.  · Numbness or tingling in the arms or legs.  DIAGNOSIS   Your health care provider will ask you about your symptoms and about activities or habits that may cause the pain. He or she may also ask about any injuries, diseases, or treatments you have had. Your health care provider will examine you to check for the range of movement that is  possible in the affected area, to check for strength in your extremities, and to check for sensation in the areas of the arms and legs supplied by different nerve roots. You may also have:   · An X-ray of the spine.  · Other imaging tests, such as MRI.  TREATMENT   Your health care provider will advise you on the best plan for treatment. Treatment may include:  · Medicines.  · Rehabilitation exercises.  HOME CARE INSTRUCTIONS   · Follow proper lifting and walking techniques as advised by your health care provider.  · Maintain good posture.  · Exercise regularly as advised by your health care provider.  · Perform relaxation exercises.  · Change your sitting, standing, and sleeping habits as advised by your health care provider.  · Change positions frequently.  · Lose weight or maintain a healthy weight as advised by your health care provider.  · Do not use any tobacco products, including cigarettes, chewing tobacco, or electronic cigarettes. If you need help quitting, ask your health care provider.  · Wear supportive footwear.  · Take medicines only as directed by your health care provider.  SEEK MEDICAL CARE IF:   · Your pain does not go away within 1-4 weeks.  · You have significant appetite or weight loss.  SEEK IMMEDIATE MEDICAL CARE IF:   ·   Your pain is severe.  · You notice weakness in your arms, hands, or legs.  · You begin to lose control of your bladder or bowel movements.  · You have fevers or night sweats.  MAKE SURE YOU:   · Understand these instructions.  · Will watch your condition.  · Will get help right away if you are not doing well or get worse.     This information is not intended to replace advice given to you by your health care provider. Make sure you discuss any questions you have with your health care provider.     Document Released: 04/17/2007 Document Revised: 07/11/2014 Document Reviewed: 10/22/2013  [employer]vier Interactive Patient Education ©2016 [employer]

## 2015-12-28 NOTE — Progress Notes (Signed)
Patient: Erin Sherman Female    DOB: [birth year]   50 y.o.   MRN: [id number] Visit Date: 12/28/2015  Today's Provider: Mar Daring, PA-C   Chief Complaint  Patient presents with  . URI  . Hip Pain   Subjective:    URI  This is a new problem. The current episode started in the past 7 days. The problem has been gradually improving. There has been no fever. Associated symptoms include congestion, coughing, headaches, a plugged ear sensation, rhinorrhea, sinus pain and wheezing. Pertinent negatives include no abdominal pain, chest pain, ear pain, nausea, sneezing or sore throat. Treatments tried: Nasonex and her inhalers. She used the last duoneb last night and needs refills. The treatment provided mild relief.   Hip Pain:Patient is here for c/o of right hip pain. She reports that yesterday her hip and leg just went out and it was numb. She had propped her leg up on a foot stool and had been laying back. When she went to get up her hip didn't want to move and she had "pins and needles" sensation down the lateral right hip to the knee. It caused her to scream out in pain.  She reports that the pain was more than 10/10 yesterday. She reports that this pain is coming from her back injury and pain from years ago. Treatments tried: PT and injections. She reports that she is going tomorrow to [facility] to spine rehabilitation clinic. She has also had 2 or 3 cortisone injections in right greater trochanter for bursitis.     Allergies  Allergen Reactions  . Latex     swelling  . Sulfa Antibiotics     Hives, itching  . Meperidine Rash   Current Meds  Medication Sig  . albuterol (PROVENTIL HFA) 108 (90 Base) MCG/ACT inhaler Inhale 2 puffs into the lungs every 6 (six) hours as needed.  Marland Kitchen BLACK COHOSH PO Take by mouth daily. Reported on 07/01/2015  . budesonide-formoterol (SYMBICORT) 160-4.5 MCG/ACT inhaler Inhale 2 puffs into the lungs 2 (two) times daily.  . calcium carbonate  (TUMS) 500 MG chewable tablet Chew 1 tablet by mouth daily.  . cetirizine-pseudoephedrine (ZYRTEC-D) 5-120 MG tablet Take 1 tablet by mouth daily. As needed for allergies  . cholecalciferol (VITAMIN D) 1000 UNITS tablet Take 1,000 Units by mouth daily.  Marland Kitchen EPINEPHrine (EPIPEN 2-PAK) 0.3 mg/0.3 mL IJ SOAJ injection   . mometasone (NASONEX) 50 MCG/ACT nasal spray Place 2 sprays into the nose daily.  . montelukast (SINGULAIR) 10 MG tablet Take 1 tablet (10 mg total) by mouth at bedtime.  . Multiple Vitamins-Minerals (MULTIVITAMIN & MINERAL PO) Take 1 tablet by mouth daily. Reported on 11/17/2015  . Spacer/Aero-Holding Chambers (OPTICHAMBER ADVANTAGE) MISC 1 each by Other route once. Always uses her when you're using a metered-dose inhaler. You've aromatase medicine as much, he won't have his much side effect, but you it twice as much medicine and your lungs.   Current Facility-Administered Medications for the 12/28/15 encounter (Office Visit) with Mar Daring, PA-C  Medication  . methylPREDNISolone acetate (DEPO-MEDROL) injection 80 mg    Review of Systems  Constitutional: Positive for fatigue. Negative for fever and chills.  HENT: Positive for congestion, postnasal drip, rhinorrhea, sinus pressure and voice change (hoarse). Negative for ear pain, sneezing, sore throat, tinnitus and trouble swallowing.   Respiratory: Positive for cough, chest tightness, shortness of breath and wheezing.   Cardiovascular: Positive for leg swelling (right leg. She reports  that her right hip is hurting and is coming from her spine.). Negative for chest pain and palpitations.  Gastrointestinal: Negative for nausea and abdominal pain.  Neurological: Positive for light-headedness and headaches. Negative for dizziness.    Social History  Substance Use Topics  . Smoking status: Former Smoker -- 0.30 packs/day for 25 years    Types: Cigarettes    Quit date: 07/16/2008  . Smokeless tobacco: Never Used  .  Alcohol Use: Yes     Comment: occasional beer.   Objective:   BP 112/70 mmHg  Pulse 90  Temp(Src) 98 F (36.7 C) (Oral)  Resp 16  Wt 258 lb (117.028 kg)  Physical Exam  Constitutional: She appears well-developed and well-nourished. No distress.  HENT:  Head: Normocephalic and atraumatic.  Right Ear: Hearing, tympanic membrane, external ear and ear canal normal.  Left Ear: Hearing, external ear and ear canal normal. A middle ear effusion is present.  Nose: Right sinus exhibits maxillary sinus tenderness. Right sinus exhibits no frontal sinus tenderness. Left sinus exhibits maxillary sinus tenderness. Left sinus exhibits no frontal sinus tenderness.  Mouth/Throat: Uvula is midline, oropharynx is clear and moist and mucous membranes are normal. No oropharyngeal exudate, posterior oropharyngeal edema or posterior oropharyngeal erythema.  Neck: Normal range of motion. Neck supple. No tracheal deviation present. No thyromegaly present.  Cardiovascular: Normal rate, regular rhythm and normal heart sounds.  Exam reveals no gallop and no friction rub.   No murmur heard. Pulmonary/Chest: Effort normal. No stridor. No respiratory distress. She has wheezes (faint). She has no rales.  Musculoskeletal:       Right hip: She exhibits decreased range of motion, decreased strength, tenderness and bony tenderness. She exhibits no swelling.       Lumbar back: She exhibits decreased range of motion and tenderness. She exhibits no bony tenderness and no spasm.  Has known DDD; Symptoms and clinical findings seem consistent with sciatica and bursitis  Lymphadenopathy:    She has no cervical adenopathy.  Skin: She is not diaphoretic.  Vitals reviewed.     Assessment & Plan:     1. Acute recurrent maxillary sinusitis Symptoms are improving. Will continue current medical treatment plan with OTC zyrtec-D, flonase, symbicort and douneb as needed. She is to call if symptoms worsen or fail to improve and may  consider adding an antibiotic.  2. Asthma, moderate persistent, with acute exacerbation See above medical treatment plan for #1. - ipratropium-albuterol (DUONEB) 0.5-2.5 (3) MG/3ML SOLN; Inhale 3 mLs into the lungs every 6 (six) hours as needed. Reported on 12/28/2015  Dispense: 360 mL; Refill: 3  3. Trochanteric bursitis of right hip Discussed adding a prednisone taper for the URI/wheezing and the hip pain, but she refused. She wants pain medication only. States she does not take daily and will even cut in half to prolong Rx. Will give Vicodin 5-325mg  as below. She is to f/u with [facility] spinal rehab tomorrow. She is to call if symptoms fail to improve or worsen. - HYDROcodone-acetaminophen (NORCO/VICODIN) 5-325 MG tablet; Take 1 tablet by mouth every 6 (six) hours as needed for moderate pain.  Dispense: 30 tablet; Refill: 0  4. DDD (degenerative disc disease), lumbar See above medical treatment plan for #3. - HYDROcodone-acetaminophen (NORCO/VICODIN) 5-325 MG tablet; Take 1 tablet by mouth every 6 (six) hours as needed for moderate pain.  Dispense: 30 tablet; Refill: 0  5. Sciatica of right side See above medical treatment plan for #3.  Mar Daring, PA-C  [facility]

## 2016-01-04 ENCOUNTER — Telehealth: Payer: Self-pay | Admitting: Family Medicine

## 2016-01-04 DIAGNOSIS — J0100 Acute maxillary sinusitis, unspecified: Secondary | ICD-10-CM

## 2016-01-04 MED ORDER — AMOXICILLIN 875 MG PO TABS: 875.0000 mg | ORAL_TABLET | Freq: Two times a day (BID) | ORAL | Status: DC

## 2016-01-04 NOTE — Telephone Encounter (Signed)
Sent in amoxil to [facility]

## 2016-01-04 NOTE — Telephone Encounter (Signed)
Please review.  Thanks,  -Zyiah Withington 

## 2016-01-04 NOTE — Telephone Encounter (Signed)
Pt states she seen you last week for ear pain and congestion.  Pt is still having ear pain and congestion.  Pt is requesting a Rx to help with this.  [facility].  [phone]

## 2016-01-04 NOTE — Telephone Encounter (Signed)
Patient advised as directed below.  Thanks,  -Joseline 

## 2016-01-29 ENCOUNTER — Telehealth: Payer: Self-pay | Admitting: *Deleted

## 2016-01-29 ENCOUNTER — Ambulatory Visit: Payer: Self-pay | Admitting: Physician Assistant

## 2016-01-29 NOTE — Telephone Encounter (Signed)
Patient advised as directed below. Per patient when can Erin Sherman see her? Put patient down for today at 4:15

## 2016-01-29 NOTE — Telephone Encounter (Signed)
It would be best if she is seen because she did have an effusion when I saw her at the end of June. Need to check in the ear before I give drops to make sure TM isnt ruptured or that it isnt just a cerumen impaction also.

## 2016-01-29 NOTE — Telephone Encounter (Signed)
Patient called office requesting a medication for ear infection/pain. Advised pt that she will need an appt, however pt wants to see if Patrici Ranks will give her an rx without her having to be seen . Please advise? [phone]

## 2016-02-04 ENCOUNTER — Ambulatory Visit: Payer: Self-pay | Admitting: Physician Assistant

## 2016-02-05 ENCOUNTER — Ambulatory Visit (INDEPENDENT_AMBULATORY_CARE_PROVIDER_SITE_OTHER): Payer: BC Managed Care – PPO | Admitting: Physician Assistant

## 2016-02-05 ENCOUNTER — Encounter: Payer: Self-pay | Admitting: Physician Assistant

## 2016-02-05 VITALS — BP 118/70 | HR 101 | Temp 98.2°F | Resp 16 | Wt 260.8 lb

## 2016-02-05 DIAGNOSIS — J0140 Acute pansinusitis, unspecified: Secondary | ICD-10-CM | POA: Diagnosis not present

## 2016-02-05 DIAGNOSIS — J4540 Moderate persistent asthma, uncomplicated: Secondary | ICD-10-CM

## 2016-02-05 DIAGNOSIS — L299 Pruritus, unspecified: Secondary | ICD-10-CM

## 2016-02-05 MED ORDER — CEFDINIR 300 MG PO CAPS: 300.0000 mg | ORAL_CAPSULE | Freq: Two times a day (BID) | ORAL | 0 refills | Status: DC

## 2016-02-05 MED ORDER — MONTELUKAST SODIUM 10 MG PO TABS: 10.0000 mg | ORAL_TABLET | Freq: Every day | ORAL | 5 refills | Status: DC

## 2016-02-05 NOTE — Progress Notes (Signed)
Patient: Erin Sherman Female    DOB: [birth year]   50 y.o.   MRN: [id number] Visit Date: 02/05/2016  Today's Provider: Mar Daring, PA-C   Chief Complaint  Patient presents with  . Otalgia   Subjective:    HPI Patient is here today with c/o ear pain radiating to her jaw, since last week.Symptoms: Sinus congestion, cough and sneezing, sore throat. No fever, chills or drainage from ear. No chest pain, palpitations. She has been taking allergies medication (Zyrtec-D, singulair) and Norco for the pain.  She has not been taking her phentermine with the zyrtec-d as to not raise her BP more.    Allergies  Allergen Reactions  . Latex     swelling  . Sulfa Antibiotics     Hives, itching  . Meperidine Rash   Current Meds  Medication Sig  . albuterol (PROVENTIL HFA) 108 (90 Base) MCG/ACT inhaler Inhale 2 puffs into the lungs every 6 (six) hours as needed.  Marland Kitchen BLACK COHOSH PO Take by mouth daily. Reported on 07/01/2015  . budesonide-formoterol (SYMBICORT) 160-4.5 MCG/ACT inhaler Inhale 2 puffs into the lungs 2 (two) times daily.  . cetirizine-pseudoephedrine (ZYRTEC-D) 5-120 MG tablet Take 1 tablet by mouth daily. As needed for allergies  . cholecalciferol (VITAMIN D) 1000 UNITS tablet Take 1,000 Units by mouth daily.  Marland Kitchen EPINEPHrine (EPIPEN 2-PAK) 0.3 mg/0.3 mL IJ SOAJ injection   . HYDROcodone-acetaminophen (NORCO/VICODIN) 5-325 MG tablet Take 1 tablet by mouth every 6 (six) hours as needed for moderate pain.  Marland Kitchen ipratropium-albuterol (DUONEB) 0.5-2.5 (3) MG/3ML SOLN Inhale 3 mLs into the lungs every 6 (six) hours as needed. Reported on 12/28/2015  . mometasone (NASONEX) 50 MCG/ACT nasal spray Place 2 sprays into the nose daily.  . montelukast (SINGULAIR) 10 MG tablet Take 1 tablet (10 mg total) by mouth at bedtime.  . Multiple Vitamins-Minerals (MULTIVITAMIN & MINERAL PO) Take 1 tablet by mouth daily. Reported on 11/17/2015  . Omega-3 1000 MG CAPS Take by mouth.  .  Spacer/Aero-Holding Chambers (OPTICHAMBER ADVANTAGE) MISC 1 each by Other route once. Always uses her when you're using a metered-dose inhaler. You've aromatase medicine as much, he won't have his much side effect, but you it twice as much medicine and your lungs.    Review of Systems  Constitutional: Positive for fatigue. Negative for chills and fever.  HENT: Positive for ear pain, sinus pressure and sneezing. Negative for congestion, ear discharge, hearing loss, postnasal drip, rhinorrhea, sore throat, tinnitus, trouble swallowing and voice change.   Eyes: Negative for pain, itching and visual disturbance.  Respiratory: Positive for cough. Negative for chest tightness, shortness of breath and wheezing.   Cardiovascular: Negative for chest pain, palpitations and leg swelling.  Gastrointestinal: Negative for abdominal pain and nausea.  Neurological: Positive for headaches. Negative for dizziness.    Social History  Substance Use Topics  . Smoking status: Former Smoker    Packs/day: 0.30    Years: 25.00    Types: Cigarettes    Quit date: 07/16/2008  . Smokeless tobacco: Never Used  . Alcohol use Yes     Comment: occasional beer.   Objective:   BP 118/70 (BP Location: Left Wrist, Patient Position: Sitting, Cuff Size: Normal)   Pulse (!) 101   Temp 98.2 F (36.8 C) (Oral)   Resp 16   Wt 260 lb 12.8 oz (118.3 kg)   BMI 50.93 kg/m   Physical Exam  Constitutional: She appears well-developed and well-nourished.  No distress.  HENT:  Head: Normocephalic and atraumatic.  Right Ear: Hearing, external ear and ear canal normal. Tympanic membrane is bulging. Tympanic membrane is not perforated and not erythematous. A middle ear effusion is present.  Left Ear: Hearing, tympanic membrane, external ear and ear canal normal.  Nose: Mucosal edema present. No rhinorrhea. Right sinus exhibits maxillary sinus tenderness and frontal sinus tenderness. Left sinus exhibits maxillary sinus tenderness  and frontal sinus tenderness.  Mouth/Throat: Uvula is midline, oropharynx is clear and moist and mucous membranes are normal. No oropharyngeal exudate, posterior oropharyngeal edema or posterior oropharyngeal erythema.  Neck: Normal range of motion. Neck supple. No tracheal deviation present. No thyromegaly present.  Cardiovascular: Normal rate, regular rhythm and normal heart sounds.  Exam reveals no gallop and no friction rub.   No murmur heard. Pulmonary/Chest: Effort normal and breath sounds normal. No stridor. No respiratory distress. She has no wheezes. She has no rales.  Lymphadenopathy:    She has no cervical adenopathy.  Skin: She is not diaphoretic.  Vitals reviewed.     Assessment & Plan:     1. Acute pansinusitis, recurrence not specified Worsening symptoms not responding to Zyrtec-D and singulair. Will give omnicef as below. She is to call if symptoms fail to improve or worsen. - cefdinir (OMNICEF) 300 MG capsule; Take 1 capsule (300 mg total) by mouth 2 (two) times daily.  Dispense: 20 capsule; Refill: 0  2. Itching Stable. Diagnosis pulled for medication refill. Continue current medical treatment plan. - montelukast (SINGULAIR) 10 MG tablet; Take 1 tablet (10 mg total) by mouth at bedtime.  Dispense: 30 tablet; Refill: 5  3. Asthma, chronic, moderate persistent, uncomplicated Stable. Diagnosis pulled for medication refill. Continue current medical treatment plan. - montelukast (SINGULAIR) 10 MG tablet; Take 1 tablet (10 mg total) by mouth at bedtime.  Dispense: 30 tablet; Refill: [place], PA-C  [facility]

## 2016-02-05 NOTE — Patient Instructions (Signed)

## 2016-05-25 ENCOUNTER — Telehealth: Payer: Self-pay | Admitting: Family Medicine

## 2016-05-25 MED ORDER — PREDNISONE 10 MG PO TABS: ORAL_TABLET | ORAL | 0 refills | Status: AC

## 2016-05-25 NOTE — Telephone Encounter (Signed)
Pt called wanting to know if you could call in a steroid for her.   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  She says she has her breathing machine with her.  She uses [facility]  Her call back is [phone]  Thanks Con Memos

## 2016-05-25 NOTE — Telephone Encounter (Signed)
rx prednisone has been sent to [facility]

## 2016-05-25 NOTE — Telephone Encounter (Signed)
Please advise 

## 2016-05-25 NOTE — Telephone Encounter (Signed)
Patient advised.

## 2016-06-03 ENCOUNTER — Telehealth: Payer: Self-pay | Admitting: Family Medicine

## 2016-06-03 DIAGNOSIS — J4540 Moderate persistent asthma, uncomplicated: Secondary | ICD-10-CM

## 2016-06-03 NOTE — Telephone Encounter (Signed)
Pt contacted office for refill request on the following medications:   [facility].  CB#[phone]  albuterol (PROVENTIL HFA) 108 (90 Base) MCG/ACT inhaler  budesonide-formoterol (SYMBICORT) 160-4.5 MCG/ACT inhale

## 2016-06-03 NOTE — Telephone Encounter (Signed)
Please review RX refill-aa

## 2016-06-03 NOTE — Telephone Encounter (Signed)
Is this patient seen you now-aa

## 2016-06-03 NOTE — Telephone Encounter (Signed)
I dont think so, she has only seen me for acute issues when Caryn Section has been full I think. If they wont fill I will though, just let me know

## 2016-06-06 MED ORDER — ALBUTEROL SULFATE HFA 108 (90 BASE) MCG/ACT IN AERS: 2.0000 | INHALATION_SPRAY | Freq: Four times a day (QID) | RESPIRATORY_TRACT | 6 refills | Status: DC | PRN

## 2016-06-06 MED ORDER — BUDESONIDE-FORMOTEROL FUMARATE 160-4.5 MCG/ACT IN AERO: 2.0000 | INHALATION_SPRAY | Freq: Two times a day (BID) | RESPIRATORY_TRACT | 12 refills | Status: DC

## 2016-06-17 ENCOUNTER — Encounter: Payer: Self-pay | Admitting: Family Medicine

## 2016-06-17 ENCOUNTER — Ambulatory Visit (INDEPENDENT_AMBULATORY_CARE_PROVIDER_SITE_OTHER): Payer: BC Managed Care – PPO | Admitting: Family Medicine

## 2016-06-17 VITALS — BP 120/60 | HR 96 | Temp 98.8°F | Resp 18 | Wt 256.0 lb

## 2016-06-17 DIAGNOSIS — K529 Noninfective gastroenteritis and colitis, unspecified: Secondary | ICD-10-CM

## 2016-06-17 DIAGNOSIS — R110 Nausea: Secondary | ICD-10-CM

## 2016-06-17 MED ORDER — PROMETHAZINE HCL 25 MG PO TABS: 25.0000 mg | ORAL_TABLET | Freq: Three times a day (TID) | ORAL | 0 refills | Status: DC | PRN

## 2016-06-17 NOTE — Patient Instructions (Signed)
Viral Gastroenteritis, Adult Introduction Viral gastroenteritis is also known as the stomach flu. This condition is caused by certain germs (viruses). These germs can be passed from person to person very easily (are very contagious). This condition can cause sudden watery poop (diarrhea), fever, and throwing up (vomiting). Having watery poop and throwing up can make you feel weak and cause you to get dehydrated. Dehydration can make you tired and thirsty, make you have a dry mouth, and make it so you pee (urinate) less often. Older adults and people with other diseases or a weak defense system (immune system) are at higher risk for dehydration. It is important to replace the fluids that you lose from having watery poop and throwing up. Follow these instructions at home: Follow instructions from your doctor about how to care for yourself at home. Eating and drinking Follow these instructions as told by your doctor:  Take an oral rehydration solution (ORS). This is a drink that is sold at pharmacies and stores.  Drink clear fluids in small amounts as you are able, such as:  Water.  Ice chips.  Diluted fruit juice.  Low-calorie sports drinks.  Eat bland, easy-to-digest foods in small amounts as you are able, such as:  Bananas.  Applesauce.  Rice.  Low-fat (lean) meats.  Toast.  Crackers.  Avoid fluids that have a lot of sugar or caffeine in them.  Avoid alcohol.  Avoid spicy or fatty foods. General instructions  Drink enough fluid to keep your pee (urine) clear or pale yellow.  Wash your hands often. If you cannot use soap and water, use hand sanitizer.  Make sure that all people in your home wash their hands well and often.  Rest at home while you get better.  Take over-the-counter and prescription medicines only as told by your doctor.  Watch your condition for any changes.  Take a warm bath to help with any burning or pain from having watery poop.  Keep all  follow-up visits as told by your doctor. This is important. Contact a doctor if:  You cannot keep fluids down.  Your symptoms get worse.  You have new symptoms.  You feel light-headed or dizzy.  You have muscle cramps. Get help right away if:  You have chest pain.  You feel very weak or you pass out (faint).  You see blood in your throw-up.  Your throw-up looks like coffee grounds.  You have bloody or black poop (stools) or poop that look like tar.  You have a very bad headache, a stiff neck, or both.  You have a rash.  You have very bad pain, cramping, or bloating in your belly (abdomen).  You have trouble breathing.  You are breathing very quickly.  Your heart is beating very quickly.  Your skin feels cold and clammy.  You feel confused.  You have pain when you pee.  You have signs of dehydration, such as:  Dark pee, hardly any pee, or no pee.  Cracked lips.  Dry mouth.  Sunken eyes.  Sleepiness.  Weakness. This information is not intended to replace advice given to you by your health care provider. Make sure you discuss any questions you have with your health care provider. Document Released: 12/07/2007 Document Revised: 01/08/2016 Document Reviewed: 02/24/2015  2017 Elsevier  

## 2016-06-17 NOTE — Progress Notes (Signed)
Patient: Erin Sherman Female    DOB: 04-02-1966   50 y.o.   MRN: [id number] Visit Date: 06/17/2016  Today's Provider: Lelon Huh, MD   Chief Complaint  Patient presents with  . Abdominal Pain   Subjective:    Abdominal Pain  This is a new problem. Episode onset: 2 days ago. The problem has been gradually worsening. The pain is located in the generalized abdominal region. Associated symptoms include diarrhea, headaches, hematochezia, myalgias, nausea and vomiting. Pertinent negatives include no anorexia, arthralgias, dysuria, fever, hematuria or melena. She has tried nothing for the symptoms.  She states she had one normal BM this morning. Ate hamburger this morning and felt nauseated, but has had no vomiting today. No chills or fevers. Her children had similar symptoms earlier this week.      Allergies  Allergen Reactions  . Latex     swelling  . Sulfa Antibiotics     Hives, itching  . Meperidine Rash     Current Outpatient Prescriptions:  .  albuterol (PROVENTIL HFA) 108 (90 Base) MCG/ACT inhaler, Inhale 2 puffs into the lungs every 6 (six) hours as needed., Disp: 18 g, Rfl: 6 .  BLACK COHOSH PO, Take by mouth daily. Reported on 07/01/2015, Disp: , Rfl:  .  budesonide-formoterol (SYMBICORT) 160-4.5 MCG/ACT inhaler, Inhale 2 puffs into the lungs 2 (two) times daily., Disp: 1 Inhaler, Rfl: 12 .  calcium carbonate (TUMS) 500 MG chewable tablet, Chew 1 tablet by mouth daily., Disp: , Rfl:  .  cetirizine-pseudoephedrine (ZYRTEC-D) 5-120 MG tablet, Take 1 tablet by mouth daily. As needed for allergies, Disp: 30 tablet, Rfl: 6 .  cholecalciferol (VITAMIN D) 1000 UNITS tablet, Take 1,000 Units by mouth daily., Disp: , Rfl:  .  EPINEPHrine (EPIPEN 2-PAK) 0.3 mg/0.3 mL IJ SOAJ injection, , Disp: , Rfl:  .  HYDROcodone-acetaminophen (NORCO/VICODIN) 5-325 MG tablet, Take 1 tablet by mouth every 6 (six) hours as needed for moderate pain., Disp: 30 tablet, Rfl: 0 .   ipratropium-albuterol (DUONEB) 0.5-2.5 (3) MG/3ML SOLN, Inhale 3 mLs into the lungs every 6 (six) hours as needed. Reported on 12/28/2015, Disp: 360 mL, Rfl: 3 .  montelukast (SINGULAIR) 10 MG tablet, Take 1 tablet (10 mg total) by mouth at bedtime., Disp: 30 tablet, Rfl: 5 .  Multiple Vitamins-Minerals (MULTIVITAMIN & MINERAL PO), Take 1 tablet by mouth daily. Reported on 11/17/2015, Disp: , Rfl:  .  Omega-3 1000 MG CAPS, Take by mouth., Disp: , Rfl:  .  promethazine (PHENERGAN) 25 MG tablet, Take 1 tablet by mouth every 8 (eight) hours as needed. Reported on 12/28/2015, Disp: , Rfl:  .  Spacer/Aero-Holding Chambers ([place]) MISC, 1 each by Other route once. Always uses her when you're using a metered-dose inhaler. You've aromatase medicine as much, he won't have his much side effect, but you it twice as much medicine and your lungs., Disp: 1 each, Rfl: 0 .  mometasone (NASONEX) 50 MCG/ACT nasal spray, Place 2 sprays into the nose daily., Disp: 17 g, Rfl: 2  Review of Systems  Constitutional: Positive for chills, diaphoresis and fatigue. Negative for appetite change and fever.  Respiratory: Positive for cough. Negative for chest tightness and shortness of breath.   Cardiovascular: Negative for chest pain and palpitations.  Gastrointestinal: Positive for abdominal pain, diarrhea, hematochezia, nausea and vomiting. Negative for anorexia and melena.  Genitourinary: Negative for dysuria and hematuria.  Musculoskeletal: Positive for myalgias. Negative for arthralgias.  Neurological: Positive for  weakness and headaches. Negative for dizziness.    Social History  Substance Use Topics  . Smoking status: Former Smoker    Packs/day: 0.30    Years: 25.00    Types: Cigarettes    Quit date: 07/16/2008  . Smokeless tobacco: Never Used  . Alcohol use Yes     Comment: occasional beer.   Objective:   BP 120/60 (BP Location: Right Arm, Patient Position: Lying left side, Cuff Size: Large)    Pulse 96   Temp 98.8 F (37.1 C) (Oral)   Resp 18   Wt 256 lb (116.1 kg)   SpO2 97% Comment: room air  BMI 50.00 kg/m   Physical Exam  General Appearance:    Alert, cooperative, no distress  Eyes:    PERRL, conjunctiva/corneas clear, EOM's intact       Lungs:     Clear to auscultation bilaterally, respirations unlabored  Heart:    Regular rate and rhythm  Abdomen:   bowel sounds present and normal in all 4 quadrants, soft, round, nontender or nondistended. No CVA tenderness        Assessment & Plan:     1. Gastroenteritis Viral. No sign on dehydration and is keeping fluids down. Counseled on consuming plenty of clear fluids and sticking to bland diet as tolerated..   2. Nausea  - promethazine (PHENERGAN) 25 MG tablet; Take 1 tablet (25 mg total) by mouth every 8 (eight) hours as needed. Reported on 12/28/2015  Dispense: 20 tablet; Refill: 0      Lyndal Rainbow, MD  [facility]

## 2016-07-19 ENCOUNTER — Telehealth: Payer: Self-pay | Admitting: Physician Assistant

## 2016-07-19 ENCOUNTER — Ambulatory Visit (INDEPENDENT_AMBULATORY_CARE_PROVIDER_SITE_OTHER): Payer: BC Managed Care – PPO | Admitting: Physician Assistant

## 2016-07-19 ENCOUNTER — Encounter: Payer: Self-pay | Admitting: Physician Assistant

## 2016-07-19 VITALS — BP 128/76 | HR 76 | Temp 98.5°F | Resp 16 | Wt 260.0 lb

## 2016-07-19 DIAGNOSIS — M5442 Lumbago with sciatica, left side: Secondary | ICD-10-CM | POA: Diagnosis not present

## 2016-07-19 DIAGNOSIS — M5441 Lumbago with sciatica, right side: Secondary | ICD-10-CM | POA: Diagnosis not present

## 2016-07-19 MED ORDER — HYDROCODONE-ACETAMINOPHEN 5-325 MG PO TABS: 1.0000 | ORAL_TABLET | Freq: Four times a day (QID) | ORAL | 0 refills | Status: DC | PRN

## 2016-07-19 NOTE — Telephone Encounter (Signed)
Reviewing patient's chart, looks like she's seen Dr. Caryn Section in the past for hip pain and trochanteric bursitis and he has given her steroid injection. I just wanted to make sure patient knew that I don't do these injections if it is the same problem.

## 2016-07-19 NOTE — Patient Instructions (Signed)
Back Exercises Introduction If you have pain in your back, do these exercises 2-3 times each day or as told by your doctor. When the pain goes away, do the exercises once each day, but repeat the steps more times for each exercise (do more repetitions). If you do not have pain in your back, do these exercises once each day or as told by your doctor. Exercises Single Knee to Chest  Do these steps 3-5 times in a row for each leg: 1. Lie on your back on a firm bed or the floor with your legs stretched out. 2. Bring one knee to your chest. 3. Hold your knee to your chest by grabbing your knee or thigh. 4. Pull on your knee until you feel a gentle stretch in your lower back. 5. Keep doing the stretch for 10-30 seconds. 6. Slowly let go of your leg and straighten it. Pelvic Tilt  Do these steps 5-10 times in a row: 1. Lie on your back on a firm bed or the floor with your legs stretched out. 2. Bend your knees so they point up to the ceiling. Your feet should be flat on the floor. 3. Tighten your lower belly (abdomen) muscles to press your lower back against the floor. This will make your tailbone point up to the ceiling instead of pointing down to your feet or the floor. 4. Stay in this position for 5-10 seconds while you gently tighten your muscles and breathe evenly. Cat-Cow  Do these steps until your lower back bends more easily: 1. Get on your hands and knees on a firm surface. Keep your hands under your shoulders, and keep your knees under your hips. You may put padding under your knees. 2. Let your head hang down, and make your tailbone point down to the floor so your lower back is round like the back of a cat. 3. Stay in this position for 5 seconds. 4. Slowly lift your head and make your tailbone point up to the ceiling so your back hangs low (sags) like the back of a cow. 5. Stay in this position for 5 seconds. Press-Ups  Do these steps 5-10 times in a row: 1. Lie on your belly  (face-down) on the floor. 2. Place your hands near your head, about shoulder-width apart. 3. While you keep your back relaxed and keep your hips on the floor, slowly straighten your arms to raise the top half of your body and lift your shoulders. Do not use your back muscles. To make yourself more comfortable, you may change where you place your hands. 4. Stay in this position for 5 seconds. 5. Slowly return to lying flat on the floor. Bridges  Do these steps 10 times in a row: 1. Lie on your back on a firm surface. 2. Bend your knees so they point up to the ceiling. Your feet should be flat on the floor. 3. Tighten your butt muscles and lift your butt off of the floor until your waist is almost as high as your knees. If you do not feel the muscles working in your butt and the back of your thighs, slide your feet 1-2 inches farther away from your butt. 4. Stay in this position for 3-5 seconds. 5. Slowly lower your butt to the floor, and let your butt muscles relax. If this exercise is too easy, try doing it with your arms crossed over your chest. Belly Crunches  Do these steps 5-10 times in a row: 1. Lie   on your back on a firm bed or the floor with your legs stretched out. 2. Bend your knees so they point up to the ceiling. Your feet should be flat on the floor. 3. Cross your arms over your chest. 4. Tip your chin a little bit toward your chest but do not bend your neck. 5. Tighten your belly muscles and slowly raise your chest just enough to lift your shoulder blades a tiny bit off of the floor. 6. Slowly lower your chest and your head to the floor. Back Lifts  Do these steps 5-10 times in a row: 1. Lie on your belly (face-down) with your arms at your sides, and rest your forehead on the floor. 2. Tighten the muscles in your legs and your butt. 3. Slowly lift your chest off of the floor while you keep your hips on the floor. Keep the back of your head in line with the curve in your back.  Look at the floor while you do this. 4. Stay in this position for 3-5 seconds. 5. Slowly lower your chest and your face to the floor. Contact a doctor if:  Your back pain gets a lot worse when you do an exercise.  Your back pain does not lessen 2 hours after you exercise. If you have any of these problems, stop doing the exercises. Do not do them again unless your doctor says it is okay. Get help right away if:  You have sudden, very bad back pain. If this happens, stop doing the exercises. Do not do them again unless your doctor says it is okay. This information is not intended to replace advice given to you by your health care provider. Make sure you discuss any questions you have with your health care provider. Document Released: 07/23/2010 Document Revised: 11/26/2015 Document Reviewed: 08/14/2014  2017 Elsevier  

## 2016-07-19 NOTE — Telephone Encounter (Signed)
Advised patient as below. Patient reports that she does not want the injection. She is requesting something to help with the pain.

## 2016-07-19 NOTE — Progress Notes (Signed)
Patient: Erin Sherman Female    DOB: October 19, 1965   51 y.o.   MRN: [id number] Visit Date: 07/19/2016  Today's Provider: Trinna Post, PA-C   Chief Complaint  Patient presents with  . Hip Pain  . Back Pain   Subjective:    Hip Pain   The pain is present in the right hip and left hip. The pain is at a severity of 10/10. Pertinent negatives include no numbness. Exacerbated by: Position changes make the pain worse.  The treatment provided no relief.  Back Pain  This is a chronic problem. The current episode started more than 1 year ago. The problem has been gradually worsening since onset. The quality of the pain is described as stabbing. The pain is at a severity of 10/10. The symptoms are aggravated by position. Pertinent negatives include no abdominal pain, bladder incontinence, bowel incontinence, dysuria, fever, headaches, leg pain, numbness, paresis or paresthesias. She has tried NSAIDs for the symptoms. The treatment provided no relief.   Patient is a 51 y/o woman with history of traumatic fall ten years ago and subsequent back pain and bilateral hip pain presenting today for an acute flare of these symptoms. She reports symptoms are worse in the cold weather. She reports pain when walking in her hips and back. She reports bilateral back pain that radiates down into both legs. No IVDU or active cancer in herself. She reports she has consulted with a surgeon in [place] about spinal surgery but was too scared to proceed. She has previously gotten cortisone injections in her hip which provided two weeks of relief but she doesn't want these anymore either because she heard they make it worse. She was supposed to have scheduled a visit with [facility] spinal rehabilitation but she has not since this past June because she was too busy. She reports she tried two months of physical therapy on [address] which made her pain worse. NSAIDs provide no relief.     Allergies  Allergen  Reactions  . Latex     swelling  . Sulfa Antibiotics     Hives, itching  . Meperidine Rash     Current Outpatient Prescriptions:  .  albuterol (PROVENTIL HFA) 108 (90 Base) MCG/ACT inhaler, Inhale 2 puffs into the lungs every 6 (six) hours as needed., Disp: 18 g, Rfl: 6 .  budesonide-formoterol (SYMBICORT) 160-4.5 MCG/ACT inhaler, Inhale 2 puffs into the lungs 2 (two) times daily., Disp: 1 Inhaler, Rfl: 12 .  calcium carbonate (TUMS) 500 MG chewable tablet, Chew 1 tablet by mouth daily., Disp: , Rfl:  .  cetirizine-pseudoephedrine (ZYRTEC-D) 5-120 MG tablet, Take 1 tablet by mouth daily. As needed for allergies, Disp: 30 tablet, Rfl: 6 .  cholecalciferol (VITAMIN D) 1000 UNITS tablet, Take 1,000 Units by mouth daily., Disp: , Rfl:  .  EPINEPHrine (EPIPEN 2-PAK) 0.3 mg/0.3 mL IJ SOAJ injection, , Disp: , Rfl:  .  ipratropium-albuterol (DUONEB) 0.5-2.5 (3) MG/3ML SOLN, Inhale 3 mLs into the lungs every 6 (six) hours as needed. Reported on 12/28/2015, Disp: 360 mL, Rfl: 3 .  montelukast (SINGULAIR) 10 MG tablet, Take 1 tablet (10 mg total) by mouth at bedtime., Disp: 30 tablet, Rfl: 5 .  Multiple Vitamins-Minerals (MULTIVITAMIN & MINERAL PO), Take 1 tablet by mouth daily. Reported on 11/17/2015, Disp: , Rfl:  .  Omega-3 1000 MG CAPS, Take by mouth., Disp: , Rfl:  .  promethazine (PHENERGAN) 25 MG tablet, Take 1 tablet (25  mg total) by mouth every 8 (eight) hours as needed. Reported on 12/28/2015, Disp: 20 tablet, Rfl: 0 .  Spacer/Aero-Holding Chambers ([place]) MISC, 1 each by Other route once. Always uses her when you're using a metered-dose inhaler. You've aromatase medicine as much, he won't have his much side effect, but you it twice as much medicine and your lungs., Disp: 1 each, Rfl: 0 .  BLACK COHOSH PO, Take by mouth daily. Reported on 07/01/2015, Disp: , Rfl:  .  mometasone (NASONEX) 50 MCG/ACT nasal spray, Place 2 sprays into the nose daily., Disp: 17 g, Rfl: 2  Review of  Systems  Constitutional: Negative.  Negative for fever.  Gastrointestinal: Negative.  Negative for abdominal pain and bowel incontinence.  Genitourinary: Negative for bladder incontinence and dysuria.  Musculoskeletal: Positive for arthralgias (Bilateral hip pain.), back pain and gait problem. Negative for joint swelling, myalgias, neck pain and neck stiffness.  Neurological: Negative for dizziness, light-headedness, numbness, headaches and paresthesias.    Social History  Substance Use Topics  . Smoking status: Former Smoker    Packs/day: 0.30    Years: 25.00    Types: Cigarettes    Quit date: 07/16/2008  . Smokeless tobacco: Never Used  . Alcohol use Yes     Comment: occasional beer.   Objective:   BP 128/76 (BP Location: Left Wrist, Patient Position: Sitting, Cuff Size: Normal)   Pulse 76   Temp 98.5 F (36.9 C) (Oral)   Resp 16   Wt 260 lb (117.9 kg)   BMI 50.78 kg/m   Physical Exam  Constitutional: She is oriented to person, place, and time. She appears well-developed and well-nourished.  Patient sitting in exam room chair looking uncomfortable and having difficulty getting up to standing position   Musculoskeletal:  ROM Reduced in all planes of motion in low back, hips, as well as C-spine. Pain and tenderness along lateral hips bilaterally. Pain in low back as well as C-spine. Gait is antalgic.  Neurological: She is alert and oriented to person, place, and time.  Skin: Skin is warm and dry.  Psychiatric: She has a normal mood and affect. Her behavior is normal.        Assessment & Plan:     1. Bilateral low back pain with bilateral sciatica, unspecified chronicity  Acute on chronic back and hip pain. Last imaging of L-spine was 2013 xray which showed some facet joint degeneration. Patient did not make spine rehab appointment. She likely needs further imaging, but I will defer this to physiatry. I have counseled patient that I will provide her with below prescription  as a one time event for these acute symptoms only until she can get in to see physiatry for chronic management. If she can, she should cut pills in half and use sparingly. Counseled on sedation risks. We have also talked about weight loss and how this will benefit her pain.   - Ambulatory referral to Orthopedics - HYDROcodone-acetaminophen (NORCO/VICODIN) 5-325 MG tablet; Take 1 tablet by mouth every 6 (six) hours as needed for severe pain.  Dispense: 30 tablet; Refill: 0  Return if symptoms worsen or fail to improve.   Patient Instructions  Back Exercises Introduction If you have pain in your back, do these exercises 2-3 times each day or as told by your doctor. When the pain goes away, do the exercises once each day, but repeat the steps more times for each exercise (do more repetitions). If you do not have pain in your  back, do these exercises once each day or as told by your doctor. Exercises Single Knee to Chest  Do these steps 3-5 times in a row for each leg: 1. Lie on your back on a firm bed or the floor with your legs stretched out. 2. Bring one knee to your chest. 3. Hold your knee to your chest by grabbing your knee or thigh. 4. Pull on your knee until you feel a gentle stretch in your lower back. 5. Keep doing the stretch for 10-30 seconds. 6. Slowly let go of your leg and straighten it. Pelvic Tilt  Do these steps 5-10 times in a row: 1. Lie on your back on a firm bed or the floor with your legs stretched out. 2. Bend your knees so they point up to the ceiling. Your feet should be flat on the floor. 3. Tighten your lower belly (abdomen) muscles to press your lower back against the floor. This will make your tailbone point up to the ceiling instead of pointing down to your feet or the floor. 4. Stay in this position for 5-10 seconds while you gently tighten your muscles and breathe evenly. Cat-Cow  Do these steps until your lower back bends more easily: 1. Get on your hands  and knees on a firm surface. Keep your hands under your shoulders, and keep your knees under your hips. You may put padding under your knees. 2. Let your head hang down, and make your tailbone point down to the floor so your lower back is round like the back of a cat. 3. Stay in this position for 5 seconds. 4. Slowly lift your head and make your tailbone point up to the ceiling so your back hangs low (sags) like the back of a cow. 5. Stay in this position for 5 seconds. Press-Ups  Do these steps 5-10 times in a row: 1. Lie on your belly (face-down) on the floor. 2. Place your hands near your head, about shoulder-width apart. 3. While you keep your back relaxed and keep your hips on the floor, slowly straighten your arms to raise the top half of your body and lift your shoulders. Do not use your back muscles. To make yourself more comfortable, you may change where you place your hands. 4. Stay in this position for 5 seconds. 5. Slowly return to lying flat on the floor. Bridges  Do these steps 10 times in a row: 1. Lie on your back on a firm surface. 2. Bend your knees so they point up to the ceiling. Your feet should be flat on the floor. 3. Tighten your butt muscles and lift your butt off of the floor until your waist is almost as high as your knees. If you do not feel the muscles working in your butt and the back of your thighs, slide your feet 1-2 inches farther away from your butt. 4. Stay in this position for 3-5 seconds. 5. Slowly lower your butt to the floor, and let your butt muscles relax. If this exercise is too easy, try doing it with your arms crossed over your chest. Belly Crunches  Do these steps 5-10 times in a row: 1. Lie on your back on a firm bed or the floor with your legs stretched out. 2. Bend your knees so they point up to the ceiling. Your feet should be flat on the floor. 3. Cross your arms over your chest. 4. Tip your chin a little bit toward your chest but do not  bend your neck. 5. Tighten your belly muscles and slowly raise your chest just enough to lift your shoulder blades a tiny bit off of the floor. 6. Slowly lower your chest and your head to the floor. Back Lifts  Do these steps 5-10 times in a row: 1. Lie on your belly (face-down) with your arms at your sides, and rest your forehead on the floor. 2. Tighten the muscles in your legs and your butt. 3. Slowly lift your chest off of the floor while you keep your hips on the floor. Keep the back of your head in line with the curve in your back. Look at the floor while you do this. 4. Stay in this position for 3-5 seconds. 5. Slowly lower your chest and your face to the floor. Contact a doctor if:  Your back pain gets a lot worse when you do an exercise.  Your back pain does not lessen 2 hours after you exercise. If you have any of these problems, stop doing the exercises. Do not do them again unless your doctor says it is okay. Get help right away if:  You have sudden, very bad back pain. If this happens, stop doing the exercises. Do not do them again unless your doctor says it is okay. This information is not intended to replace advice given to you by your health care provider. Make sure you discuss any questions you have with your health care provider. Document Released: 07/23/2010 Document Revised: 11/26/2015 Document Reviewed: 08/14/2014  2017 Elsevier    The entirety of the information documented in the History of Present Illness, Review of Systems and Physical Exam were personally obtained by me. Portions of this information were initially documented by Ashley Royalty, CMA and reviewed by me for thoroughness and accuracy.         Trinna Post, PA-C  [facility]

## 2016-07-26 ENCOUNTER — Encounter: Payer: Self-pay | Admitting: Family Medicine

## 2016-07-26 ENCOUNTER — Ambulatory Visit (INDEPENDENT_AMBULATORY_CARE_PROVIDER_SITE_OTHER): Payer: BC Managed Care – PPO | Admitting: Family Medicine

## 2016-07-26 VITALS — BP 122/76 | Temp 98.9°F | Resp 16 | Wt 252.0 lb

## 2016-07-26 DIAGNOSIS — R110 Nausea: Secondary | ICD-10-CM | POA: Diagnosis not present

## 2016-07-26 DIAGNOSIS — J020 Streptococcal pharyngitis: Secondary | ICD-10-CM | POA: Diagnosis not present

## 2016-07-26 DIAGNOSIS — H6691 Otitis media, unspecified, right ear: Secondary | ICD-10-CM

## 2016-07-26 MED ORDER — PROMETHAZINE HCL 25 MG PO TABS: 25.0000 mg | ORAL_TABLET | Freq: Three times a day (TID) | ORAL | 0 refills | Status: DC | PRN

## 2016-07-26 MED ORDER — AMOXICILLIN-POT CLAVULANATE 875-125 MG PO TABS: 1.0000 | ORAL_TABLET | Freq: Two times a day (BID) | ORAL | 0 refills | Status: AC

## 2016-07-26 NOTE — Progress Notes (Signed)
Patient: Erin Sherman Female    DOB: 1965/10/06   51 y.o.   MRN: [id number] Visit Date: 07/26/2016  Today's Provider: Lelon Huh, MD   Chief Complaint  Patient presents with  . Ear Pain  . Headache   Subjective:    HPI Patient comes in today c/o right ear pain. She reports that she has had pain for 4 days. Patient reports that 2 days ago she went to the urgent care and she was tested positive for strep. Patient reports that she is currently taking penicillin V 500mg  BID for her symptoms. Patient reports that she still has pain in her ear causing associated headaches. Patient denies any fever, and she has not been taking anything OTC for her headaches.     Allergies  Allergen Reactions  . Latex     swelling  . Sulfa Antibiotics     Hives, itching  . Meperidine Rash     Current Outpatient Prescriptions:  .  albuterol (PROVENTIL HFA) 108 (90 Base) MCG/ACT inhaler, Inhale 2 puffs into the lungs every 6 (six) hours as needed., Disp: 18 g, Rfl: 6 .  BLACK COHOSH PO, Take by mouth daily. Reported on 07/01/2015, Disp: , Rfl:  .  budesonide-formoterol (SYMBICORT) 160-4.5 MCG/ACT inhaler, Inhale 2 puffs into the lungs 2 (two) times daily., Disp: 1 Inhaler, Rfl: 12 .  calcium carbonate (TUMS) 500 MG chewable tablet, Chew 1 tablet by mouth daily., Disp: , Rfl:  .  cetirizine-pseudoephedrine (ZYRTEC-D) 5-120 MG tablet, Take 1 tablet by mouth daily. As needed for allergies, Disp: 30 tablet, Rfl: 6 .  cholecalciferol (VITAMIN D) 1000 UNITS tablet, Take 1,000 Units by mouth daily., Disp: , Rfl:  .  EPINEPHrine (EPIPEN 2-PAK) 0.3 mg/0.3 mL IJ SOAJ injection, , Disp: , Rfl:  .  HYDROcodone-acetaminophen (NORCO/VICODIN) 5-325 MG tablet, Take 1 tablet by mouth every 6 (six) hours as needed for severe pain., Disp: 30 tablet, Rfl: 0 .  ipratropium-albuterol (DUONEB) 0.5-2.5 (3) MG/3ML SOLN, Inhale 3 mLs into the lungs every 6 (six) hours as needed. Reported on 12/28/2015, Disp: 360 mL,  Rfl: 3 .  mometasone (NASONEX) 50 MCG/ACT nasal spray, Place 2 sprays into the nose daily., Disp: 17 g, Rfl: 2 .  montelukast (SINGULAIR) 10 MG tablet, Take 1 tablet (10 mg total) by mouth at bedtime., Disp: 30 tablet, Rfl: 5 .  Multiple Vitamins-Minerals (MULTIVITAMIN & MINERAL PO), Take 1 tablet by mouth daily. Reported on 11/17/2015, Disp: , Rfl:  .  Omega-3 1000 MG CAPS, Take by mouth., Disp: , Rfl:  .  promethazine (PHENERGAN) 25 MG tablet, Take 1 tablet (25 mg total) by mouth every 8 (eight) hours as needed. Reported on 12/28/2015, Disp: 20 tablet, Rfl: 0 .  Spacer/Aero-Holding Chambers ([place]) MISC, 1 each by Other route once. Always uses her when you're using a metered-dose inhaler. You've aromatase medicine as much, he won't have his much side effect, but you it twice as much medicine and your lungs., Disp: 1 each, Rfl: 0  Review of Systems  Constitutional: Positive for activity change, appetite change, chills and fatigue.  HENT: Positive for ear pain and sore throat.   Respiratory: Positive for cough. Negative for shortness of breath and wheezing.   Gastrointestinal: Positive for nausea. Negative for diarrhea and vomiting.  Neurological: Positive for headaches.    Social History  Substance Use Topics  . Smoking status: Former Smoker    Packs/day: 0.30    Years: 25.00  Types: Cigarettes    Quit date: 07/16/2008  . Smokeless tobacco: Never Used  . Alcohol use Yes     Comment: occasional beer.   Objective:   BP 122/76 (BP Location: Left Arm, Patient Position: Sitting, Cuff Size: Normal)   Temp 98.9 F (37.2 C)   Resp 16   Wt 252 lb (114.3 kg)   BMI 49.22 kg/m   Physical Exam  General Appearance:    Alert, cooperative, no distress  HENT:   left TM normal without fluid or infection, right TM red, dull, bulging, right TM fluid noted, neck has bilateral anterior cervical nodes enlarged, pharynx erythematous without exudate, sinuses nontender and nasal mucosa  congested  Eyes:    PERRL, conjunctiva/corneas clear, EOM's intact       Lungs:     Clear to auscultation bilaterally, respirations unlabored  Heart:    Regular rate and rhythm  Neurologic:   Awake, alert, oriented x 3. No apparent focal neurological           defect.           Assessment & Plan:     1. Strep pharyngitis  - amoxicillin-clavulanate (AUGMENTIN) 875-125 MG tablet; Take 1 tablet by mouth 2 (two) times daily.  Dispense: 20 tablet; Refill: 0  2. Otitis of right ear Change from PenVk to Augmentin - amoxicillin-clavulanate (AUGMENTIN) 875-125 MG tablet; Take 1 tablet by mouth 2 (two) times daily.  Dispense: 20 tablet; Refill: 0  3. Nausea She requests refill for promethazine for occasional nausea.  - promethazine (PHENERGAN) 25 MG tablet; Take 1 tablet (25 mg total) by mouth every 8 (eight) hours as needed. Reported on 12/28/2015  Dispense: 20 tablet; Refill: 0  Work excuse to return to work on Saturday 07/30/2016      Lelon Huh, MD  [facility]

## 2016-07-27 ENCOUNTER — Telehealth: Payer: Self-pay | Admitting: Physician Assistant

## 2016-07-27 ENCOUNTER — Other Ambulatory Visit: Payer: Self-pay | Admitting: Physician Assistant

## 2016-07-27 NOTE — Telephone Encounter (Signed)
Dr. Sharlet Salina, orthopedist, declined referral. Recommended pain clinic. I can refer patient to pain clinic, or she can try to get in at [facility] spine rehab like she had planned originally. Which does she prefer?

## 2016-07-27 NOTE — Telephone Encounter (Signed)
Dr Sharlet Salina declined referral stating that pt would be better fit for pain clinic

## 2016-07-28 NOTE — Telephone Encounter (Signed)
Patient refused going to pain clinic and would like a referral to [facility]. Patient reports she will call us back with more information about who she wants to see in [facility]. sd

## 2016-08-19 ENCOUNTER — Telehealth: Payer: Self-pay | Admitting: Emergency Medicine

## 2016-08-19 MED ORDER — CETIRIZINE-PSEUDOEPHEDRINE ER 5-120 MG PO TB12: 1.0000 | ORAL_TABLET | Freq: Every day | ORAL | 6 refills | Status: DC

## 2016-08-19 NOTE — Telephone Encounter (Signed)
Ok to send into the pharmacy? Please advise. Thanks!  

## 2016-08-19 NOTE — Telephone Encounter (Signed)
Pt would like refill on Zyrtec D. She knows that it is OTC but the only way it will get paid is if she has a rx sent in for it. Please advise.   [facility]

## 2016-09-29 ENCOUNTER — Encounter: Payer: Self-pay | Admitting: Physician Assistant

## 2016-09-29 ENCOUNTER — Ambulatory Visit (INDEPENDENT_AMBULATORY_CARE_PROVIDER_SITE_OTHER): Payer: BC Managed Care – PPO | Admitting: Physician Assistant

## 2016-09-29 VITALS — BP 110/68 | HR 100 | Temp 98.1°F | Resp 16 | Wt 258.6 lb

## 2016-09-29 DIAGNOSIS — T782XXD Anaphylactic shock, unspecified, subsequent encounter: Secondary | ICD-10-CM

## 2016-09-29 DIAGNOSIS — M5442 Lumbago with sciatica, left side: Secondary | ICD-10-CM | POA: Diagnosis not present

## 2016-09-29 DIAGNOSIS — M5441 Lumbago with sciatica, right side: Secondary | ICD-10-CM | POA: Diagnosis not present

## 2016-09-29 DIAGNOSIS — R110 Nausea: Secondary | ICD-10-CM

## 2016-09-29 MED ORDER — HYDROCODONE-ACETAMINOPHEN 5-325 MG PO TABS: 1.0000 | ORAL_TABLET | Freq: Four times a day (QID) | ORAL | 0 refills | Status: DC | PRN

## 2016-09-29 MED ORDER — EPINEPHRINE 0.3 MG/0.3ML IJ SOAJ: 0.3000 mg | INTRAMUSCULAR | 3 refills | Status: DC | PRN

## 2016-09-29 MED ORDER — PROMETHAZINE HCL 25 MG PO TABS: 25.0000 mg | ORAL_TABLET | Freq: Three times a day (TID) | ORAL | 0 refills | Status: DC | PRN

## 2016-09-29 NOTE — Progress Notes (Signed)
Patient: Erin Sherman Female    DOB: 1965-08-29   51 y.o.   MRN: [id number] Visit Date: 09/29/2016  Today's Provider: Mar Daring, PA-C   Chief Complaint  Patient presents with  . Back Pain   Subjective:    HPI Patient is here today with c/o back pain. This is a chronic problem. 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 She reports she needs her pain pills and that she knows that she needs to lose weight and cut down on her soda intake. Saw spine doctor [facility] and was told she had the above findings, they didn't want to do any treatment at this time due to weight.   She is asking for refills on her promethazine, EPIPEN, and Hydrocodone-Acetaminophen.     Allergies  Allergen Reactions  . Latex     swelling  . Sulfa Antibiotics     Hives, itching  . Meperidine Rash     Current Outpatient Prescriptions:  .  albuterol (PROVENTIL HFA) 108 (90 Base) MCG/ACT inhaler, Inhale 2 puffs into the lungs every 6 (six) hours as needed., Disp: 18 g, Rfl: 6 .  BLACK COHOSH PO, Take by mouth daily. Reported on 07/01/2015, Disp: , Rfl:  .  budesonide-formoterol (SYMBICORT) 160-4.5 MCG/ACT inhaler, Inhale 2 puffs into the lungs 2 (two) times daily., Disp: 1 Inhaler, Rfl: 12 .  calcium carbonate (TUMS) 500 MG chewable tablet, Chew 1 tablet by mouth daily., Disp: , Rfl:  .  cetirizine-pseudoephedrine (ZYRTEC-D) 5-120 MG tablet, Take 1 tablet by mouth daily. As needed for allergies, Disp: 30 tablet, Rfl: 6 .  cholecalciferol (VITAMIN D) 1000 UNITS tablet, Take 1,000 Units by mouth daily., Disp: , Rfl:  .  EPINEPHrine (EPIPEN 2-PAK) 0.3 mg/0.3 mL IJ SOAJ injection, , Disp: , Rfl:  .  ipratropium-albuterol (DUONEB) 0.5-2.5 (3) MG/3ML SOLN, Inhale 3 mLs into the lungs every 6 (six) hours as needed. Reported on 12/28/2015, Disp: 360 mL, Rfl: 3 .  montelukast (SINGULAIR) 10 MG tablet, Take 1 tablet (10 mg total) by mouth at bedtime., Disp: 30  tablet, Rfl: 5 .  Multiple Vitamins-Minerals (MULTIVITAMIN & MINERAL PO), Take 1 tablet by mouth daily. Reported on 11/17/2015, Disp: , Rfl:  .  Omega-3 1000 MG CAPS, Take by mouth., Disp: , Rfl:  .  promethazine (PHENERGAN) 25 MG tablet, Take 1 tablet (25 mg total) by mouth every 8 (eight) hours as needed. Reported on 12/28/2015, Disp: 20 tablet, Rfl: 0 .  Spacer/Aero-Holding Chambers ([place]) MISC, 1 each by Other route once. Always uses her when you're using a metered-dose inhaler. You've aromatase medicine as much, he won't have his much side effect, but you it twice as much medicine and your lungs., Disp: 1 each, Rfl: 0 .  HYDROcodone-acetaminophen (NORCO/VICODIN) 5-325 MG tablet, Take 1 tablet by mouth every 6 (six) hours as needed for severe pain. (Patient not taking: Reported on 09/29/2016), Disp: 30 tablet, Rfl: 0 .  mometasone (NASONEX) 50 MCG/ACT nasal spray, Place 2 sprays into the nose daily., Disp: 17 g, Rfl: 2  Review of Systems  Constitutional: Negative.   Respiratory: Negative.   Cardiovascular: Negative.   Musculoskeletal: Positive for arthralgias and back pain. Negative for gait problem, joint swelling, myalgias, neck pain and neck stiffness.  Neurological: Negative.   Psychiatric/Behavioral: Negative.     Social History  Substance Use Topics  . Smoking status: Former Smoker    Packs/day:  0.30    Years: 25.00    Types: Cigarettes    Quit date: 07/16/2008  . Smokeless tobacco: Never Used  . Alcohol use Yes     Comment: occasional beer.   Objective:   BP 110/68 (BP Location: Right Arm, Patient Position: Sitting, Cuff Size: Large)   Pulse 100   Temp 98.1 F (36.7 C) (Oral)   Resp 16   Wt 258 lb 9.6 oz (117.3 kg)   BMI 50.50 kg/m    Physical Exam  Constitutional: She appears well-developed and well-nourished. No distress.  Neck: Normal range of motion. Neck supple. No tracheal deviation present. No thyromegaly present.  Cardiovascular: Normal rate,  regular rhythm and normal heart sounds.  Exam reveals no gallop and no friction rub.   No murmur heard. Pulmonary/Chest: Effort normal and breath sounds normal. No respiratory distress. She has no wheezes. She has no rales.  Musculoskeletal:       Thoracic back: Normal.       Lumbar back: She exhibits tenderness. She exhibits normal range of motion, no bony tenderness and no spasm.  Lymphadenopathy:    She has no cervical adenopathy.  Skin: She is not diaphoretic.  Vitals reviewed.      Assessment & Plan:     1. Bilateral low back pain with bilateral sciatica, unspecified chronicity Stable. Diagnosis pulled for medication refill. Continue current medical treatment plan. Advised patient that she needs to stick with one provider in the office for this issue to continue treatment. It has also been recommended by another specialist for her to establish with the pain clinic since she is not a surgical candidate. Patient has also refused other previous conservative treatments. I advised her to f/u with Dr. Caryn Section (her PCP) in the future if she continues having severe enough back pain to require narcotic therapy. - HYDROcodone-acetaminophen (NORCO/VICODIN) 5-325 MG tablet; Take 1 tablet by mouth every 6 (six) hours as needed for severe pain. Patient request brand  Dispense: 30 tablet; Refill: 0  2. Nausea Stable. Diagnosis pulled for medication refill. Continue current medical treatment plan. - promethazine (PHENERGAN) 25 MG tablet; Take 1 tablet (25 mg total) by mouth every 8 (eight) hours as needed. Reported on 12/28/2015  Dispense: 20 tablet; Refill: 0  3. Anaphylaxis, subsequent encounter Stable. Diagnosis pulled for medication refill. Continue current medical treatment plan. Has not had to use hers but they have expired. - EPINEPHrine (EPIPEN 2-PAK) 0.3 mg/0.3 mL IJ SOAJ injection; Inject 0.3 mLs (0.3 mg total) into the muscle as needed. With allergic reaction  Dispense: 1 Device; Refill: [place], PA-C  [facility]

## 2016-09-29 NOTE — Patient Instructions (Signed)

## 2016-10-13 ENCOUNTER — Ambulatory Visit (INDEPENDENT_AMBULATORY_CARE_PROVIDER_SITE_OTHER): Payer: BC Managed Care – PPO | Admitting: Family Medicine

## 2016-10-13 ENCOUNTER — Ambulatory Visit
Admission: RE | Admit: 2016-10-13 | Discharge: 2016-10-13 | Disposition: A | Discharge status: Home / Self Care | Payer: BC Managed Care – PPO | Source: Ambulatory Visit | Attending: Family Medicine | Admitting: Family Medicine

## 2016-10-13 ENCOUNTER — Encounter: Payer: Self-pay | Admitting: Family Medicine

## 2016-10-13 VITALS — BP 112/62 | HR 96 | Temp 99.4°F | Resp 16 | Wt 253.0 lb

## 2016-10-13 DIAGNOSIS — R109 Unspecified abdominal pain: Secondary | ICD-10-CM | POA: Diagnosis not present

## 2016-10-13 LAB — POCT URINALYSIS DIPSTICK
Bilirubin, UA: NEGATIVE
Blood, UA: NEGATIVE
GLUCOSE UA: NEGATIVE
Ketones, UA: NEGATIVE
LEUKOCYTES UA: NEGATIVE
NITRITE UA: NEGATIVE
Protein, UA: NEGATIVE
Spec Grav, UA: 1.01 (ref 1.010–1.025)
UROBILINOGEN UA: 0.2 U/dL
pH, UA: 8 (ref 5.0–8.0)

## 2016-10-13 NOTE — Patient Instructions (Signed)
Go to the [facility] on [address] for abdominal Xray  

## 2016-10-13 NOTE — Progress Notes (Signed)
Patient: Erin Sherman Female    DOB: 04/30/1966   51 y.o.   MRN: [id number] Visit Date: 10/13/2016  Today's Provider: Lelon Huh, MD   Chief Complaint  Patient presents with  . Abdominal Pain   Subjective:    Abdominal Pain  This is a new problem. Episode onset: 4 days ago. The problem has been gradually worsening. Pain location: right lower and mid abdomen and side. Quality: pressure. Pain radiation: right leg. Associated symptoms include nausea. Pertinent negatives include no anorexia, arthralgias, belching, constipation, diarrhea, dysuria, fever, flatus, frequency, headaches, hematochezia, hematuria, melena, myalgias or vomiting. Exacerbated by: sitting or laying down. She has tried nothing for the symptoms.  Feels worse when she sits and feels like a pressure in her abdomen which worsens. Has taken ibuprofen which provides no relief.     Allergies  Allergen Reactions  . Latex     swelling  . Sulfa Antibiotics     Hives, itching  . Meperidine Rash     Current Outpatient Prescriptions:  .  albuterol (PROVENTIL HFA) 108 (90 Base) MCG/ACT inhaler, Inhale 2 puffs into the lungs every 6 (six) hours as needed., Disp: 18 g, Rfl: 6 .  BLACK COHOSH PO, Take by mouth daily. Reported on 07/01/2015, Disp: , Rfl:  .  budesonide-formoterol (SYMBICORT) 160-4.5 MCG/ACT inhaler, Inhale 2 puffs into the lungs 2 (two) times daily., Disp: 1 Inhaler, Rfl: 12 .  calcium carbonate (TUMS) 500 MG chewable tablet, Chew 1 tablet by mouth daily., Disp: , Rfl:  .  cetirizine-pseudoephedrine (ZYRTEC-D) 5-120 MG tablet, Take 1 tablet by mouth daily. As needed for allergies, Disp: 30 tablet, Rfl: 6 .  cholecalciferol (VITAMIN D) 1000 UNITS tablet, Take 1,000 Units by mouth daily., Disp: , Rfl:  .  EPINEPHrine (EPIPEN 2-PAK) 0.3 mg/0.3 mL IJ SOAJ injection, Inject 0.3 mLs (0.3 mg total) into the muscle as needed. With allergic reaction, Disp: 1 Device, Rfl: 3 .  HYDROcodone-acetaminophen  (NORCO/VICODIN) 5-325 MG tablet, Take 1 tablet by mouth every 6 (six) hours as needed for severe pain. Patient request brand, Disp: 30 tablet, Rfl: 0 .  ipratropium-albuterol (DUONEB) 0.5-2.5 (3) MG/3ML SOLN, Inhale 3 mLs into the lungs every 6 (six) hours as needed. Reported on 12/28/2015, Disp: 360 mL, Rfl: 3 .  montelukast (SINGULAIR) 10 MG tablet, Take 1 tablet (10 mg total) by mouth at bedtime., Disp: 30 tablet, Rfl: 5 .  Multiple Vitamins-Minerals (MULTIVITAMIN & MINERAL PO), Take 1 tablet by mouth daily. Reported on 11/17/2015, Disp: , Rfl:  .  Omega-3 1000 MG CAPS, Take by mouth., Disp: , Rfl:  .  promethazine (PHENERGAN) 25 MG tablet, Take 1 tablet (25 mg total) by mouth every 8 (eight) hours as needed. Reported on 12/28/2015, Disp: 20 tablet, Rfl: 0 .  Spacer/Aero-Holding Chambers ([place]) MISC, 1 each by Other route once. Always uses her when you're using a metered-dose inhaler. You've aromatase medicine as much, he won't have his much side effect, but you it twice as much medicine and your lungs., Disp: 1 each, Rfl: 0 .  mometasone (NASONEX) 50 MCG/ACT nasal spray, Place 2 sprays into the nose daily., Disp: 17 g, Rfl: 2  Review of Systems  Constitutional: Negative for appetite change, chills, fatigue and fever.  Respiratory: Negative for chest tightness and shortness of breath.   Cardiovascular: Negative for chest pain and palpitations.  Gastrointestinal: Positive for abdominal pain and nausea. Negative for anorexia, constipation, diarrhea, flatus, hematochezia, melena and vomiting.  Genitourinary: Negative for dysuria, frequency and hematuria.  Musculoskeletal: Negative for arthralgias and myalgias.  Neurological: Negative for dizziness, weakness and headaches.    Social History  Substance Use Topics  . Smoking status: Former Smoker    Packs/day: 0.30    Years: 25.00    Types: Cigarettes    Quit date: 07/16/2008  . Smokeless tobacco: Never Used  . Alcohol use Yes      Comment: occasional beer.   Objective:   BP 112/62 (BP Location: Left Arm, Patient Position: Sitting, Cuff Size: Large)   Pulse 96   Temp 99.4 F (37.4 C) (Oral)   Resp 16   Wt 253 lb (114.8 kg)   SpO2 99% Comment: room air  BMI 49.41 kg/m  There were no vitals filed for this visit.   Physical Exam  General Appearance:    Alert, cooperative, no distress  Eyes:    PERRL, conjunctiva/corneas clear, EOM's intact       Lungs:     Clear to auscultation bilaterally, respirations unlabored  Heart:    Regular rate and rhythm  Abdomen:   bowel sounds present and normal in all 4 quadrants, soft, round or nondistended. No CVA tenderness. Tender right upper mid and lower abdomen. No masses. No rebound, no guarding.    Results for orders placed or performed in visit on 10/13/16  POCT Urinalysis Dipstick  Result Value Ref Range   Color, UA yellow    Clarity, UA clear    Glucose, UA Negative    Bilirubin, UA Negative    Ketones, UA Negative    Spec Grav, UA 1.010 1.010 - 1.025   Blood, UA Negative    pH, UA 8.0 5.0 - 8.0   Protein, UA Negative    Urobilinogen, UA 0.2 0.2 or 1.0 E.U./dL   Nitrite, UA Negative    Leukocytes, UA Negative Negative        Assessment & Plan:     1. Right sided abdominal pain Consider ultrasound or CT after reviewing xrays.  - POCT Urinalysis Dipstick - DG Abd 2 Views; Future     The entirety of the information documented in the History of Present Illness, Review of Systems and Physical Exam were personally obtained by me. Portions of this information were initially documented by Meyer Cory, CMA and reviewed by me for thoroughness and accuracy.    Lelon Huh, MD  [facility]

## 2016-10-14 ENCOUNTER — Telehealth: Payer: Self-pay | Admitting: Family Medicine

## 2016-10-14 ENCOUNTER — Other Ambulatory Visit: Payer: Self-pay

## 2016-10-14 DIAGNOSIS — R109 Unspecified abdominal pain: Secondary | ICD-10-CM

## 2016-10-14 NOTE — Telephone Encounter (Signed)
Pt calling for xray from yesterday.  Pt ststes her back is hurting a lot right now  Her call back is [phone]

## 2016-10-14 NOTE — Telephone Encounter (Signed)
-----   Message from Birdie Sons, MD sent at 10/14/2016  1:27 PM EDT ----- Please order CT abdomen and pelvis with contrast.

## 2016-10-31 ENCOUNTER — Other Ambulatory Visit: Payer: Self-pay | Admitting: *Deleted

## 2016-10-31 DIAGNOSIS — R109 Unspecified abdominal pain: Secondary | ICD-10-CM

## 2016-10-31 NOTE — Progress Notes (Unsigned)
Please schedule referral to GI? Thanks! 

## 2016-11-02 ENCOUNTER — Encounter: Payer: Self-pay | Admitting: Family Medicine

## 2016-11-04 ENCOUNTER — Ambulatory Visit (INDEPENDENT_AMBULATORY_CARE_PROVIDER_SITE_OTHER): Payer: BC Managed Care – PPO | Admitting: Family Medicine

## 2016-11-04 ENCOUNTER — Other Ambulatory Visit: Payer: Self-pay

## 2016-11-04 ENCOUNTER — Telehealth: Payer: Self-pay

## 2016-11-04 ENCOUNTER — Encounter: Payer: Self-pay | Admitting: Family Medicine

## 2016-11-04 VITALS — BP 112/68 | HR 72 | Temp 99.0°F | Resp 16 | Wt 255.0 lb

## 2016-11-04 DIAGNOSIS — R109 Unspecified abdominal pain: Secondary | ICD-10-CM

## 2016-11-04 DIAGNOSIS — M5441 Lumbago with sciatica, right side: Secondary | ICD-10-CM

## 2016-11-04 DIAGNOSIS — M5442 Lumbago with sciatica, left side: Secondary | ICD-10-CM

## 2016-11-04 DIAGNOSIS — Z1211 Encounter for screening for malignant neoplasm of colon: Secondary | ICD-10-CM

## 2016-11-04 MED ORDER — HYDROCODONE-ACETAMINOPHEN 5-325 MG PO TABS: 1.0000 | ORAL_TABLET | Freq: Four times a day (QID) | ORAL | 0 refills | Status: DC | PRN

## 2016-11-04 MED ORDER — NAPROXEN 500 MG PO TABS: 500.0000 mg | ORAL_TABLET | Freq: Two times a day (BID) | ORAL | 0 refills | Status: DC

## 2016-11-04 NOTE — Progress Notes (Signed)
Patient: Erin Sherman Female    DOB: [birth year]   51 y.o.   MRN: [id number] Visit Date: 11/04/2016  Today's Provider: Lelon Huh, MD   Chief Complaint  Patient presents with  . Abdominal Pain   Subjective:    Abdominal Pain  This is a new problem. The current episode started more than 1 month ago. The problem occurs constantly. The problem has been unchanged. The pain is located in the RLQ. The quality of the pain is aching, sharp and a sensation of fullness. Associated symptoms include constipation and nausea. Pertinent negatives include no diarrhea, fever or vomiting.    Patient was seen in the office on 10/13/2016 with similar symptoms. She had an abdominal xray and abdominal CT scan, and both results were normal. Patient reports that her pain is constant and radiates down her right leg at times. She does have some mild chronic back pain with Xr in 2013 showing DDD.       Allergies  Allergen Reactions  . Latex     swelling  . Sulfa Antibiotics     Hives, itching  . Meperidine Rash     Current Outpatient Prescriptions:  .  albuterol (PROVENTIL HFA) 108 (90 Base) MCG/ACT inhaler, Inhale 2 puffs into the lungs every 6 (six) hours as needed., Disp: 18 g, Rfl: 6 .  BLACK COHOSH PO, Take by mouth daily. Reported on 07/01/2015, Disp: , Rfl:  .  budesonide-formoterol (SYMBICORT) 160-4.5 MCG/ACT inhaler, Inhale 2 puffs into the lungs 2 (two) times daily., Disp: 1 Inhaler, Rfl: 12 .  calcium carbonate (TUMS) 500 MG chewable tablet, Chew 1 tablet by mouth daily., Disp: , Rfl:  .  cetirizine-pseudoephedrine (ZYRTEC-D) 5-120 MG tablet, Take 1 tablet by mouth daily. As needed for allergies, Disp: 30 tablet, Rfl: 6 .  cholecalciferol (VITAMIN D) 1000 UNITS tablet, Take 1,000 Units by mouth daily., Disp: , Rfl:  .  EPINEPHrine (EPIPEN 2-PAK) 0.3 mg/0.3 mL IJ SOAJ injection, Inject 0.3 mLs (0.3 mg total) into the muscle as needed. With allergic reaction, Disp: 1 Device, Rfl: 3 .   HYDROcodone-acetaminophen (NORCO/VICODIN) 5-325 MG tablet, Take 1 tablet by mouth every 6 (six) hours as needed for severe pain. Patient request brand, Disp: 30 tablet, Rfl: 0 .  ipratropium-albuterol (DUONEB) 0.5-2.5 (3) MG/3ML SOLN, Inhale 3 mLs into the lungs every 6 (six) hours as needed. Reported on 12/28/2015, Disp: 360 mL, Rfl: 3 .  mometasone (NASONEX) 50 MCG/ACT nasal spray, Place 2 sprays into the nose daily., Disp: 17 g, Rfl: 2 .  montelukast (SINGULAIR) 10 MG tablet, Take 1 tablet (10 mg total) by mouth at bedtime., Disp: 30 tablet, Rfl: 5 .  Multiple Vitamins-Minerals (MULTIVITAMIN & MINERAL PO), Take 1 tablet by mouth daily. Reported on 11/17/2015, Disp: , Rfl:  .  Omega-3 1000 MG CAPS, Take by mouth., Disp: , Rfl:  .  promethazine (PHENERGAN) 25 MG tablet, Take 1 tablet (25 mg total) by mouth every 8 (eight) hours as needed. Reported on 12/28/2015, Disp: 20 tablet, Rfl: 0 .  Spacer/Aero-Holding Chambers ([place]) MISC, 1 each by Other route once. Always uses her when you're using a metered-dose inhaler. You've aromatase medicine as much, he won't have his much side effect, but you it twice as much medicine and your lungs., Disp: 1 each, Rfl: 0  Review of Systems  Constitutional: Positive for activity change and chills. Negative for fever.  Gastrointestinal: Positive for abdominal pain, constipation and nausea. Negative for blood  in stool, diarrhea, rectal pain and vomiting.    Social History  Substance Use Topics  . Smoking status: Former Smoker    Packs/day: 0.30    Years: 25.00    Types: Cigarettes    Quit date: 07/16/2008  . Smokeless tobacco: Never Used  . Alcohol use Yes     Comment: occasional beer.   Objective:   BP 112/68 (BP Location: Right Arm, Patient Position: Sitting, Cuff Size: Normal)   Pulse 72   Temp 99 F (37.2 C)   Resp 16   Wt 255 lb (115.7 kg)   BMI 49.80 kg/m  Vitals:   11/04/16 0810  BP: 112/68  Pulse: 72  Resp: 16  Temp: 99 F  (37.2 C)  Weight: 255 lb (115.7 kg)     Physical Exam  General appearance: alert, well developed, well nourished, cooperative and in no distress, obeses Head: Normocephalic, without obvious abnormality, atraumatic Respiratory: Respirations even and unlabored, normal respiratory rate Extremities: No gross deformities Skin: Skin color, texture, turgor normal. No rashes seen  Psych: Appropriate mood and affect. Neurologic: Mental status: Alert, oriented to person, place, and time, thought content appropriate.     Assessment & Plan:     1. Right sided abdominal pain Non-specific, but some characteristics of radicular pain. Recommend she try prednisone which she refuses because It makes her swell up. Will try course of naproxen for a few weeks. Unclear if this is related to intraabdominal process, but recent abdominal/pelvic CT was unremarkable.   2. Bilateral low back pain with bilateral sciatica, unspecified chronicity She request refill for hydrocodone/apap which has been effective.  - HYDROcodone-acetaminophen (NORCO/VICODIN) 5-325 MG tablet; Take 1 tablet by mouth every 6 (six) hours as needed for severe pain. Patient request brand  Dispense: 30 tablet; Refill: 0  3. Colon cancer screening She states her father was recently diagnosed with colon cancer. Unclear if her current symptoms are GI related, but counseled she is definitely due for colonoscopy.  - Ambulatory referral to Gastroenterology     The entirety of the information documented in the History of Present Illness, Review of Systems and Physical Exam were personally obtained by me. Portions of this information were initially documented by Wilburt Finlay, CMA and reviewed by me for thoroughness and accuracy.    Lelon Huh, MD  [facility]

## 2016-11-04 NOTE — Telephone Encounter (Signed)
Gastroenterology Pre-Procedure Review  Request Date: Tue May 29th 2018  Requesting Physician: Dr. Allen Norris  PATIENT REVIEW QUESTIONS: The patient responded to the following health history questions as indicated:    1. Are you having any GI issues? no 2. Do you have a personal history of Polyps? no 3. Do you have a family history of Colon Cancer or Polyps? no 4. Diabetes Mellitus? no 5. Joint replacements in the past 12 months?no 6. Major health problems in the past 3 months?no 7. Any artificial heart valves, MVP, or defibrillator?no    MEDICATIONS & ALLERGIES:    Patient reports the following regarding taking any anticoagulation/antiplatelet therapy:   Plavix, Coumadin, Eliquis, Xarelto, Lovenox, Pradaxa, Brilinta, or Effient? no Aspirin? no  Patient confirms/reports the following medications:  Current Outpatient Prescriptions  Medication Sig Dispense Refill  . albuterol (PROVENTIL HFA) 108 (90 Base) MCG/ACT inhaler Inhale 2 puffs into the lungs every 6 (six) hours as needed. 18 g 6  . BLACK COHOSH PO Take by mouth daily. Reported on 07/01/2015    . budesonide-formoterol (SYMBICORT) 160-4.5 MCG/ACT inhaler Inhale 2 puffs into the lungs 2 (two) times daily. 1 Inhaler 12  . calcium carbonate (TUMS) 500 MG chewable tablet Chew 1 tablet by mouth daily.    . cetirizine-pseudoephedrine (ZYRTEC-D) 5-120 MG tablet Take 1 tablet by mouth daily. As needed for allergies 30 tablet 6  . cholecalciferol (VITAMIN D) 1000 UNITS tablet Take 1,000 Units by mouth daily.    Marland Kitchen EPINEPHrine (EPIPEN 2-PAK) 0.3 mg/0.3 mL IJ SOAJ injection Inject 0.3 mLs (0.3 mg total) into the muscle as needed. With allergic reaction 1 Device 3  . HYDROcodone-acetaminophen (NORCO/VICODIN) 5-325 MG tablet Take 1 tablet by mouth every 6 (six) hours as needed for severe pain. Patient request brand 30 tablet 0  . ipratropium-albuterol (DUONEB) 0.5-2.5 (3) MG/3ML SOLN Inhale 3 mLs into the lungs every 6 (six) hours as needed. Reported  on 12/28/2015 360 mL 3  . mometasone (NASONEX) 50 MCG/ACT nasal spray Place 2 sprays into the nose daily. 17 g 2  . montelukast (SINGULAIR) 10 MG tablet Take 1 tablet (10 mg total) by mouth at bedtime. 30 tablet 5  . Multiple Vitamins-Minerals (MULTIVITAMIN & MINERAL PO) Take 1 tablet by mouth daily. Reported on 11/17/2015    . naproxen (NAPROSYN) 500 MG tablet Take 1 tablet (500 mg total) by mouth 2 (two) times daily with a meal. 30 tablet 0  . Omega-3 1000 MG CAPS Take by mouth.    . promethazine (PHENERGAN) 25 MG tablet Take 1 tablet (25 mg total) by mouth every 8 (eight) hours as needed. Reported on 12/28/2015 20 tablet 0  . Spacer/Aero-Holding Chambers ([employer]) MISC 1 each by Other route once. Always uses her when you're using a metered-dose inhaler. You've aromatase medicine as much, he won't have his much side effect, but you it twice as much medicine and your lungs. 1 each 0   No current facility-administered medications for this visit.     Patient confirms/reports the following allergies:  Allergies  Allergen Reactions  . Latex     swelling  . Sulfa Antibiotics     Hives, itching  . Meperidine Rash    No orders of the defined types were placed in this encounter.   AUTHORIZATION INFORMATION Primary Insurance: 1D#: Group #:  Secondary Insurance: 1D#: Group #:  SCHEDULE INFORMATION: Date: May 29th, Dr. Allen Norris [facility] Time: Location:

## 2016-11-08 ENCOUNTER — Other Ambulatory Visit: Payer: Self-pay

## 2016-11-09 ENCOUNTER — Telehealth: Payer: Self-pay | Admitting: Gastroenterology

## 2016-11-09 NOTE — Telephone Encounter (Signed)
11/09/16 Spoke with Margaretmary Dys at [facility] and NO prior Josem Kaufmann is required for Screening Colonoscopy [phone] / Z12.11

## 2016-11-29 ENCOUNTER — Encounter
Admission: RE | Disposition: A | Discharge status: Home / Self Care | Payer: Self-pay | Source: Ambulatory Visit | Attending: Gastroenterology

## 2016-11-29 ENCOUNTER — Ambulatory Visit: Payer: BC Managed Care – PPO | Admitting: Certified Registered"

## 2016-11-29 ENCOUNTER — Ambulatory Visit
Admission: RE | Admit: 2016-11-29 | Discharge: 2016-11-29 | Disposition: A | Discharge status: Home / Self Care | Payer: BC Managed Care – PPO | Source: Ambulatory Visit | Attending: Gastroenterology | Admitting: Gastroenterology

## 2016-11-29 ENCOUNTER — Encounter: Payer: Self-pay | Admitting: Anesthesiology

## 2016-11-29 DIAGNOSIS — R51 Headache: Secondary | ICD-10-CM | POA: Diagnosis not present

## 2016-11-29 DIAGNOSIS — Z1211 Encounter for screening for malignant neoplasm of colon: Secondary | ICD-10-CM | POA: Diagnosis not present

## 2016-11-29 DIAGNOSIS — Z8719 Personal history of other diseases of the digestive system: Secondary | ICD-10-CM | POA: Diagnosis not present

## 2016-11-29 DIAGNOSIS — G709 Myoneural disorder, unspecified: Secondary | ICD-10-CM | POA: Diagnosis not present

## 2016-11-29 DIAGNOSIS — Z79899 Other long term (current) drug therapy: Secondary | ICD-10-CM | POA: Diagnosis not present

## 2016-11-29 DIAGNOSIS — Z800 Family history of malignant neoplasm of digestive organs: Secondary | ICD-10-CM | POA: Insufficient documentation

## 2016-11-29 DIAGNOSIS — D125 Benign neoplasm of sigmoid colon: Secondary | ICD-10-CM | POA: Diagnosis not present

## 2016-11-29 DIAGNOSIS — J45909 Unspecified asthma, uncomplicated: Secondary | ICD-10-CM | POA: Insufficient documentation

## 2016-11-29 DIAGNOSIS — Z87891 Personal history of nicotine dependence: Secondary | ICD-10-CM | POA: Diagnosis not present

## 2016-11-29 DIAGNOSIS — K635 Polyp of colon: Secondary | ICD-10-CM | POA: Insufficient documentation

## 2016-11-29 DIAGNOSIS — K219 Gastro-esophageal reflux disease without esophagitis: Secondary | ICD-10-CM | POA: Diagnosis not present

## 2016-11-29 HISTORY — PX: COLONOSCOPY WITH PROPOFOL: SHX5780

## 2016-11-29 SURGERY — COLONOSCOPY WITH PROPOFOL
Anesthesia: General

## 2016-11-29 MED ORDER — LIDOCAINE HCL (PF) 1 % IJ SOLN
2.0000 mL | Freq: Once | INTRAMUSCULAR | Status: AC
  Administered 2016-11-29: 0.3 mL via INTRADERMAL

## 2016-11-29 MED ORDER — LIDOCAINE HCL (PF) 1 % IJ SOLN
INTRAMUSCULAR | Status: AC
  Administered 2016-11-29: 0.3 mL via INTRADERMAL
  Filled 2016-11-29: qty 2

## 2016-11-29 MED ORDER — PROPOFOL 500 MG/50ML IV EMUL
INTRAVENOUS | Status: DC | PRN
  Administered 2016-11-29: 120 ug/kg/min via INTRAVENOUS

## 2016-11-29 MED ORDER — LIDOCAINE 2% (20 MG/ML) 5 ML SYRINGE
INTRAMUSCULAR | Status: DC | PRN
  Administered 2016-11-29: 25 mg via INTRAVENOUS

## 2016-11-29 MED ORDER — SODIUM CHLORIDE 0.9 % IV SOLN
INTRAVENOUS | Status: DC
  Administered 2016-11-29: 1000 mL via INTRAVENOUS

## 2016-11-29 MED ORDER — PROPOFOL 10 MG/ML IV BOLUS
INTRAVENOUS | Status: DC | PRN
  Administered 2016-11-29: 30 mg via INTRAVENOUS
  Administered 2016-11-29: 70 mg via INTRAVENOUS

## 2016-11-29 NOTE — Anesthesia Post-op Follow-up Note (Cosign Needed)
Anesthesia QCDR form completed.        

## 2016-11-29 NOTE — H&P (Signed)
Erin Lame, MD [address] [place], [place] Sherman  [id number] Phone: [phone] Fax : [phone]  Primary Care Physician:  Birdie Sons, MD Primary Gastroenterologist:  Dr. Allen Norris  Pre-Procedure History & Physical: HPI:  Erin Sherman is a 51 y.o. female is here for a screening colonoscopy.   Past Medical History:  Diagnosis Date  . Cervical cancer ([place])   . Hx of bronchitis   . Migraines   . Stomach ulcer     Past Surgical History:  Procedure Laterality Date  . ABDOMINAL HYSTERECTOMY     with unilateral oophorectomy and part of cervix removed due to cervical cancer  . CARPAL TUNNEL RELEASE Right 2013  . CARPAL TUNNEL RELEASE  03/02/2010   Endoscopic carpal tunnel release. Rica Mast, MD (Orthopedic, [facility])  . CESAREAN SECTION    . exercise treadmill Test  05/31/2006   Normal  . TUBAL LIGATION      Prior to Admission medications   Medication Sig Start Date End Date Taking? Authorizing Provider  albuterol (PROVENTIL HFA) 108 (90 Base) MCG/ACT inhaler Inhale 2 puffs into the lungs every 6 (six) hours as needed. 06/06/16   Birdie Sons, MD  BLACK COHOSH PO Take by mouth daily. Reported on 07/01/2015    [provider]  budesonide-formoterol (SYMBICORT) 160-4.5 MCG/ACT inhaler Inhale 2 puffs into the lungs 2 (two) times daily. 06/06/16   Birdie Sons, MD  calcium carbonate (TUMS) 500 MG chewable tablet Chew 1 tablet by mouth daily.    [provider]  cetirizine-pseudoephedrine (ZYRTEC-D) 5-120 MG tablet Take 1 tablet by mouth daily. As needed for allergies 08/19/16   Birdie Sons, MD  cholecalciferol (VITAMIN D) 1000 UNITS tablet Take 1,000 Units by mouth daily.    [provider]  EPINEPHrine (EPIPEN 2-PAK) 0.3 mg/0.3 mL IJ SOAJ injection Inject 0.3 mLs (0.3 mg total) into the muscle as needed. With allergic reaction 09/29/16   Mar Daring, PA-C  HYDROcodone-acetaminophen (NORCO/VICODIN) 5-325 MG tablet Take 1  tablet by mouth every 6 (six) hours as needed for severe pain. Patient request brand 11/04/16   Birdie Sons, MD  ipratropium-albuterol (DUONEB) 0.5-2.5 (3) MG/3ML SOLN Inhale 3 mLs into the lungs every 6 (six) hours as needed. Reported on 12/28/2015 12/28/15   Mar Daring, PA-C  mometasone (NASONEX) 50 MCG/ACT nasal spray Place 2 sprays into the nose daily. 05/15/15 05/14/16  Birdie Sons, MD  montelukast (SINGULAIR) 10 MG tablet Take 1 tablet (10 mg total) by mouth at bedtime. 02/05/16   Mar Daring, PA-C  Multiple Vitamins-Minerals (MULTIVITAMIN & MINERAL PO) Take 1 tablet by mouth daily. Reported on 11/17/2015    [provider]  naproxen (NAPROSYN) 500 MG tablet Take 1 tablet (500 mg total) by mouth 2 (two) times daily with a meal. 11/04/16   Birdie Sons, MD  Omega-3 1000 MG CAPS Take by mouth.    [provider]  promethazine (PHENERGAN) 25 MG tablet Take 1 tablet (25 mg total) by mouth every 8 (eight) hours as needed. Reported on 12/28/2015 09/29/16   Mar Daring, PA-C  Spacer/Aero-Holding Chambers [facility] ADVANTAGE) MISC 1 each by Other route once. Always uses her when you're using a metered-dose inhaler. You've aromatase medicine as much, he won't have his much side effect, but you it twice as much medicine and your lungs. 05/24/15   Ahmed Prima, MD    Allergies as of 11/04/2016 - Review Complete 11/04/2016  Allergen Reaction  Noted  . Latex  07/16/2013  . Sulfa antibiotics  07/16/2013  . Meperidine Rash 12/28/2015    Family History  Problem Relation Age of Onset  . Emphysema Mother   . Asthma Mother   . Diabetes Mother        type 2  . COPD Mother   . Hypertension Mother   . Emphysema Father   . Hypertension Father   . COPD Father   . Colon cancer Father   . Asthma Sister   . Asthma Brother   . Heart attack Brother   . Heart attack Maternal Grandmother   . Heart attack Paternal Grandmother   . Anemia Sister   .  Asthma Son   . Cancer Maternal Grandfather        prostate  . Cancer Maternal Aunt        breast  . Cancer Other     Social History   Social History  . Marital status: Divorced    Spouse name: N/A  . Number of children: 1  . Years of education: N/A   Occupational History  . 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 Smoking status: Former Smoker    Packs/day: 0.30    Years: 25.00    Types: Cigarettes    Quit date: 07/16/2008  . Smokeless tobacco: Never Used  . Alcohol use Yes     Comment: occasional beer.  . Drug use: No  . Sexual activity: Not on file   Other Topics Concern  . Not on file   Social History Narrative  . No narrative on file    Review of Systems: See HPI, otherwise negative ROS  Physical Exam: BP (!) 135/94   Pulse 96   Temp (!) 96.8 F (36 C) (Tympanic)   Resp 20   Ht 5' (1.524 m)   Wt 240 lb (108.9 kg)   SpO2 100%   BMI 46.87 kg/m  General:   Alert,  pleasant and cooperative in NAD Head:  Normocephalic and atraumatic. Neck:  Supple; no masses or thyromegaly. Lungs:  Clear throughout to auscultation.    Heart:  Regular rate and rhythm. Abdomen:  Soft, nontender and nondistended. Normal bowel sounds, without guarding, and without rebound.   Neurologic:  Alert and  oriented x4;  grossly normal neurologically.  Impression/Plan: Erin Sherman is now here to undergo a screening colonoscopy.  Risks, benefits, and alternatives regarding colonoscopy have been reviewed with the patient.  Questions have been answered.  All parties agreeable.

## 2016-11-29 NOTE — Transfer of Care (Signed)
Immediate Anesthesia Transfer of Care Note  Patient: [name]  Procedure(s) Performed: Procedure(s): COLONOSCOPY WITH PROPOFOL (N/A)  Patient Location: PACU  Anesthesia Type:General  Level of Consciousness: awake and alert   Airway & Oxygen Therapy: Patient Spontanous Breathing and Patient connected to nasal cannula oxygen  Post-op Assessment: Report given to RN and Post -op Vital signs reviewed and stable  Post vital signs: Reviewed  Last Vitals:  Vitals:   11/29/16 0906 11/29/16 1014  BP: (!) 135/94 115/76  Pulse: 96 83  Resp: 20 18  Temp: (!) 36 C (!) 35.8 C    Last Pain:  Vitals:   11/29/16 0906  TempSrc: Tympanic         Complications: No apparent anesthesia complications

## 2016-11-29 NOTE — Anesthesia Postprocedure Evaluation (Signed)
Anesthesia Post Note  Patient: Erin Sherman  Procedure(s) Performed: Procedure(s) (LRB): COLONOSCOPY WITH PROPOFOL (N/A)  Patient location during evaluation: Endoscopy Anesthesia Type: General Level of consciousness: awake and alert Pain management: pain level controlled Vital Signs Assessment: post-procedure vital signs reviewed and stable Respiratory status: spontaneous breathing, nonlabored ventilation, respiratory function stable and patient connected to nasal cannula oxygen Cardiovascular status: blood pressure returned to baseline and stable Postop Assessment: no signs of nausea or vomiting Anesthetic complications: no     Last Vitals:  Vitals:   11/29/16 1015 11/29/16 1027  BP: 115/76 123/83  Pulse: 88 79  Resp: 19 18  Temp: (!) 35.8 C     Last Pain:  Vitals:   11/29/16 1015  TempSrc: Tympanic                 Precious Haws Piscitello

## 2016-11-29 NOTE — Anesthesia Preprocedure Evaluation (Signed)
Anesthesia Evaluation  Patient identified by MRN, date of birth, ID band Patient awake    Reviewed: Allergy & Precautions, H&P , NPO status , Patient's Chart, lab work & pertinent test results  History of Anesthesia Complications Negative for: history of anesthetic complications  Airway Mallampati: III  TM Distance: >3 FB Neck ROM: full    Dental  (+) Poor Dentition, Chipped   Pulmonary asthma , former smoker,    Pulmonary exam normal breath sounds clear to auscultation       Cardiovascular Exercise Tolerance: Good (-) Past MI negative cardio ROS Normal cardiovascular exam Rhythm:regular Rate:Normal     Neuro/Psych  Headaches,  Neuromuscular disease negative psych ROS   GI/Hepatic Neg liver ROS, PUD, GERD  ,  Endo/Other  negative endocrine ROS  Renal/GU negative Renal ROS  negative genitourinary   Musculoskeletal  (+) Arthritis ,   Abdominal   Peds  Hematology negative hematology ROS (+)   Anesthesia Other Findings Past Medical History: No date: Cervical cancer ([facility]) No date: Hx of bronchitis No date: Migraines No date: Stomach ulcer  Past Surgical History: No date: ABDOMINAL HYSTERECTOMY     Comment: with unilateral oophorectomy and part of               cervix removed due to cervical cancer 2013: [place] Right 03/02/2010: CARPAL TUNNEL RELEASE     Comment: Endoscopic carpal tunnel release. Rica Mast, MD (Orthopedic, [facility]) No date: CESAREAN SECTION 05/31/2006: exercise treadmill Test     Comment: Normal No date: TUBAL LIGATION  BMI    Body Mass Index:  46.87 kg/m      Reproductive/Obstetrics negative OB ROS                             Anesthesia Physical Anesthesia Plan  ASA: III  Anesthesia Plan: General   Post-op Pain Management:    Induction: Intravenous  Airway Management Planned: Natural Airway and Nasal Cannula  Additional  Equipment:   Intra-op Plan:   Post-operative Plan:   Informed Consent: I have reviewed the patients History and Physical, chart, labs and discussed the procedure including the risks, benefits and alternatives for the proposed anesthesia with the patient or authorized representative who has indicated his/her understanding and acceptance.   Dental Advisory Given  Plan Discussed with: Anesthesiologist, CRNA and Surgeon  Anesthesia Plan Comments: (Patient consented for risks of anesthesia including but not limited to:  - adverse reactions to medications - damage to teeth, lips or other oral mucosa - sore throat or hoarseness - Damage to heart, brain, lungs or loss of life  Patient voiced understanding.)        Anesthesia Quick Evaluation

## 2016-11-29 NOTE — Op Note (Signed)
[facility] Gastroenterology Patient Name: Erin Sherman Procedure Date: 11/29/2016 9:37 AM MRN: [id number] Account #: [id number] Date of Birth: Dec 13, 1965 Admit Type: Outpatient Age: 51 Room: [facility] ENDO ROOM 4 Gender: Female Note Status: Finalized Procedure:            Colonoscopy Indications:          Screening for colorectal malignant neoplasm Providers:            [name] MD, MD Referring MD:         Kirstie Peri. Caryn Section, MD (Referring MD) Medicines:            Propofol per Anesthesia Complications:        No immediate complications. Procedure:            Pre-Anesthesia Assessment:                       - Prior to the procedure, a History and Physical was                        performed, and patient medications and allergies were                        reviewed. The patient's tolerance of previous                        anesthesia was also reviewed. The risks and benefits of                        the procedure and the sedation options and risks were                        discussed with the patient. All questions were                        answered, and informed consent was obtained. Prior                        Anticoagulants: The patient has taken no previous                        anticoagulant or antiplatelet agents. ASA Grade                        Assessment: II - A patient with mild systemic disease.                        After reviewing the risks and benefits, the patient was                        deemed in satisfactory condition to undergo the                        procedure.                       After obtaining informed consent, the colonoscope was                        passed under direct vision. Throughout the procedure,  the patient's blood pressure, pulse, and oxygen                        saturations were monitored continuously. The Olympus                        CF-H180AL colonoscope ( S#: [id number] ) was  introduced                        through the anus and advanced to the the cecum,                        identified by appendiceal orifice and ileocecal valve.                        The colonoscopy was performed without difficulty. The                        patient tolerated the procedure well. The quality of                        the bowel preparation was excellent. Findings:      The perianal and digital rectal examinations were normal.      Two sessile polyps were found in the sigmoid colon. The polyps were 2 to       3 mm in size. These polyps were removed with a cold biopsy forceps.       Resection and retrieval were complete. Impression:           - Two 2 to 3 mm polyps in the sigmoid colon, removed                        with a cold biopsy forceps. Resected and retrieved. Recommendation:       - Discharge patient to home.                       - Resume previous diet.                       - Continue present medications.                       - Await pathology results.                       - Repeat colonoscopy in 5 years if polyp adenoma and 10                        years if hyperplastic Procedure Code(s):    --- Professional ---                       [id number], Colonoscopy, flexible; with biopsy, single or                        multiple Diagnosis Code(s):    --- Professional ---                       Z12.11, Encounter for screening for malignant neoplasm  of colon                       D12.5, Benign neoplasm of sigmoid colon CPT copyright 2016 American Medical Association. All rights reserved. The codes documented in this report are preliminary and upon coder review may  be revised to meet current compliance requirements. [name] MD, MD 11/29/2016 10:11:48 AM This report has been signed electronically. Number of Addenda: 0 Note Initiated On: 11/29/2016 9:37 AM Scope Withdrawal Time: 0 hours 6 minutes 6 seconds  Total Procedure Duration: 0 hours 9  minutes 9 seconds       [facility]

## 2016-11-30 ENCOUNTER — Encounter: Payer: Self-pay | Admitting: Family Medicine

## 2016-11-30 ENCOUNTER — Encounter: Payer: Self-pay | Admitting: Gastroenterology

## 2016-11-30 LAB — SURGICAL PATHOLOGY

## 2016-12-01 ENCOUNTER — Telehealth: Payer: Self-pay

## 2016-12-01 ENCOUNTER — Encounter: Payer: Self-pay | Admitting: Gastroenterology

## 2016-12-01 NOTE — Telephone Encounter (Signed)
error 

## 2017-01-20 ENCOUNTER — Encounter: Payer: Self-pay | Admitting: Family Medicine

## 2017-01-20 ENCOUNTER — Ambulatory Visit (INDEPENDENT_AMBULATORY_CARE_PROVIDER_SITE_OTHER): Payer: BC Managed Care – PPO | Admitting: Family Medicine

## 2017-01-20 VITALS — BP 100/68 | HR 88 | Temp 98.4°F | Resp 16 | Wt 256.0 lb

## 2017-01-20 DIAGNOSIS — R110 Nausea: Secondary | ICD-10-CM

## 2017-01-20 DIAGNOSIS — R1084 Generalized abdominal pain: Secondary | ICD-10-CM

## 2017-01-20 DIAGNOSIS — G43809 Other migraine, not intractable, without status migrainosus: Secondary | ICD-10-CM | POA: Diagnosis not present

## 2017-01-20 MED ORDER — KETOROLAC TROMETHAMINE 60 MG/2ML IM SOLN
60.0000 mg | Freq: Once | INTRAMUSCULAR | Status: AC
  Administered 2017-01-20: 60 mg via INTRAMUSCULAR

## 2017-01-20 MED ORDER — PROMETHAZINE HCL 25 MG PO TABS: 25.0000 mg | ORAL_TABLET | Freq: Three times a day (TID) | ORAL | 0 refills | Status: DC | PRN

## 2017-01-20 MED ORDER — PROMETHAZINE HCL 25 MG/ML IJ SOLN
25.0000 mg | Freq: Once | INTRAMUSCULAR | Status: AC
  Administered 2017-01-20: 25 mg via INTRAMUSCULAR

## 2017-01-20 NOTE — Progress Notes (Signed)
Patient: Erin Sherman Female    DOB: [birth year]   51 y.o.   MRN: [id number] Visit Date: 01/20/2017  Today's Provider: Lelon Huh, MD   Chief Complaint  Patient presents with  . Headache  . Nausea   Subjective:    Patient stated that she starting having a severe headache with nausea this morning. Patient describes headache as being bandlike pressure. Patient also states that she is seeing black dots. Patient is also still having LLQ abdominal pain.    Headache   This is a new problem. The current episode started today. The problem occurs constantly. The problem has been gradually worsening. The pain is located in the bilateral region. The pain does not radiate. The pain quality is similar to prior headaches. The quality of the pain is described as aching, squeezing, pulsating, sharp and band-like. The pain is at a severity of 10/10. The pain is severe. Associated symptoms include abdominal pain, blurred vision, dizziness, a loss of balance, nausea, neck pain, photophobia, scalp tenderness and a visual change. Pertinent negatives include no abnormal behavior, anorexia, back pain, coughing, drainage, ear pain, eye pain, eye redness, eye watering, facial sweating, fever, hearing loss, insomnia, muscle aches, numbness, phonophobia, rhinorrhea, seizures, sinus pressure, sore throat, swollen glands, tingling, tinnitus, vomiting, weakness or weight loss. Nothing aggravates the symptoms. She has tried nothing for the symptoms.   She states headache is identical to previous migraines. States she used to have to go to ER periodically for 'cocktail' to treat headaches which including phenergan and morphine. She states she has also had ketorolac before which is usually effective.     Allergies  Allergen Reactions  . Latex     swelling  . Sulfa Antibiotics     Hives, itching  . Meperidine Rash     Current Outpatient Prescriptions:  .  albuterol (PROVENTIL HFA) 108 (90 Base)  MCG/ACT inhaler, Inhale 2 puffs into the lungs every 6 (six) hours as needed., Disp: 18 g, Rfl: 6 .  BLACK COHOSH PO, Take by mouth daily. Reported on 07/01/2015, Disp: , Rfl:  .  budesonide-formoterol (SYMBICORT) 160-4.5 MCG/ACT inhaler, Inhale 2 puffs into the lungs 2 (two) times daily., Disp: 1 Inhaler, Rfl: 12 .  calcium carbonate (TUMS) 500 MG chewable tablet, Chew 1 tablet by mouth daily., Disp: , Rfl:  .  cetirizine-pseudoephedrine (ZYRTEC-D) 5-120 MG tablet, Take 1 tablet by mouth daily. As needed for allergies, Disp: 30 tablet, Rfl: 6 .  cholecalciferol (VITAMIN D) 1000 UNITS tablet, Take 1,000 Units by mouth daily., Disp: , Rfl:  .  EPINEPHrine (EPIPEN 2-PAK) 0.3 mg/0.3 mL IJ SOAJ injection, Inject 0.3 mLs (0.3 mg total) into the muscle as needed. With allergic reaction, Disp: 1 Device, Rfl: 3 .  HYDROcodone-acetaminophen (NORCO/VICODIN) 5-325 MG tablet, Take 1 tablet by mouth every 6 (six) hours as needed for severe pain. Patient request brand, Disp: 30 tablet, Rfl: 0 .  ipratropium-albuterol (DUONEB) 0.5-2.5 (3) MG/3ML SOLN, Inhale 3 mLs into the lungs every 6 (six) hours as needed. Reported on 12/28/2015, Disp: 360 mL, Rfl: 3 .  montelukast (SINGULAIR) 10 MG tablet, Take 1 tablet (10 mg total) by mouth at bedtime., Disp: 30 tablet, Rfl: 5 .  Multiple Vitamins-Minerals (MULTIVITAMIN & MINERAL PO), Take 1 tablet by mouth daily. Reported on 11/17/2015, Disp: , Rfl:  .  naproxen (NAPROSYN) 500 MG tablet, Take 1 tablet (500 mg total) by mouth 2 (two) times daily with a meal., Disp: 30 tablet, Rfl:  0 .  Omega-3 1000 MG CAPS, Take by mouth., Disp: , Rfl:  .  promethazine (PHENERGAN) 25 MG tablet, Take 1 tablet (25 mg total) by mouth every 8 (eight) hours as needed. Reported on 12/28/2015, Disp: 20 tablet, Rfl: 0 .  Spacer/Aero-Holding Chambers ([place]) MISC, 1 each by Other route once. Always uses her when you're using a metered-dose inhaler. You've aromatase medicine as much, he  won't have his much side effect, but you it twice as much medicine and your lungs., Disp: 1 each, Rfl: 0 .  mometasone (NASONEX) 50 MCG/ACT nasal spray, Place 2 sprays into the nose daily., Disp: 17 g, Rfl: 2  Review of Systems  Constitutional: Negative for fever and weight loss.  HENT: Negative for ear pain, hearing loss, rhinorrhea, sinus pressure, sore throat and tinnitus.   Eyes: Positive for blurred vision and photophobia. Negative for pain and redness.  Respiratory: Negative for cough.   Gastrointestinal: Positive for abdominal pain and nausea. Negative for anorexia and vomiting.  Musculoskeletal: Positive for neck pain. Negative for back pain.  Neurological: Positive for dizziness, headaches and loss of balance. Negative for tingling, seizures, weakness and numbness.  Psychiatric/Behavioral: The patient does not have insomnia.     Social History  Substance Use Topics  . Smoking status: Former Smoker    Packs/day: 0.30    Years: 25.00    Types: Cigarettes    Quit date: 07/16/2008  . Smokeless tobacco: Never Used  . Alcohol use Yes     Comment: occasional beer.   Objective:   BP 100/68 (BP Location: Right Arm, Patient Position: Sitting, Cuff Size: Large)   Pulse 88   Temp 98.4 F (36.9 C) (Oral)   Resp 16   Wt 256 lb (116.1 kg)   SpO2 95%   BMI 50.00 kg/m  Vitals:   01/20/17 1636  BP: 100/68  Pulse: 88  Resp: 16  Temp: 98.4 F (36.9 C)  TempSrc: Oral  SpO2: 95%  Weight: 256 lb (116.1 kg)     Physical Exam   General Appearance:    Alert, cooperative, no distress  Eyes:    PERRL, conjunctiva/corneas clear, EOM's intact       Lungs:     Clear to auscultation bilaterally, respirations unlabored  Heart:    Regular rate and rhythm  Neurologic:   Awake, alert, oriented x 3. No apparent focal neurological           defect.           Assessment & Plan:     1. Other migraine without status migrainosus, not intractable  - ketorolac (TORADOL) injection 60 mg;  Inject 2 mLs (60 mg total) into the muscle once. - promethazine (PHENERGAN) injection 25 mg; Inject 1 mL (25 mg total) into the muscle once.  2. Nausea  - promethazine (PHENERGAN) 25 MG tablet; Take 1 tablet (25 mg total) by mouth every 8 (eight) hours as needed. Reported on 12/28/2015  Dispense: 20 tablet; Refill: 0  3. Generalized abdominal pain Persistent for several months with recent colonoscopy remarkable only for few hyperplastic polyps. She requests further imaging studies  - US Abdomen Complete; Future       Lelon Huh, MD  [facility]

## 2017-01-26 ENCOUNTER — Ambulatory Visit
Admission: RE | Admit: 2017-01-26 | Discharge: 2017-01-26 | Disposition: A | Discharge status: Home / Self Care | Payer: BC Managed Care – PPO | Source: Ambulatory Visit | Attending: Family Medicine | Admitting: Family Medicine

## 2017-01-26 ENCOUNTER — Other Ambulatory Visit: Payer: Self-pay | Admitting: Family Medicine

## 2017-01-26 DIAGNOSIS — R1031 Right lower quadrant pain: Secondary | ICD-10-CM

## 2017-01-26 DIAGNOSIS — Z90721 Acquired absence of ovaries, unilateral: Secondary | ICD-10-CM | POA: Insufficient documentation

## 2017-01-26 DIAGNOSIS — R1084 Generalized abdominal pain: Secondary | ICD-10-CM | POA: Diagnosis not present

## 2017-01-26 DIAGNOSIS — Z90710 Acquired absence of both cervix and uterus: Secondary | ICD-10-CM | POA: Diagnosis not present

## 2017-01-30 ENCOUNTER — Other Ambulatory Visit: Payer: Self-pay

## 2017-01-30 DIAGNOSIS — M5441 Lumbago with sciatica, right side: Secondary | ICD-10-CM

## 2017-01-30 DIAGNOSIS — M5442 Lumbago with sciatica, left side: Principal | ICD-10-CM

## 2017-01-30 MED ORDER — PREDNISONE 10 MG (48) PO TBPK: ORAL_TABLET | ORAL | 0 refills | Status: DC

## 2017-01-30 MED ORDER — HYDROCODONE-ACETAMINOPHEN 5-325 MG PO TABS: 1.0000 | ORAL_TABLET | Freq: Four times a day (QID) | ORAL | 0 refills | Status: DC | PRN

## 2017-01-30 NOTE — Telephone Encounter (Signed)
-----   Message from Birdie Sons, MD sent at 01/30/2017  8:36 AM EDT ----- Ultrasound is completely normal. Nothing in abdomen or pelvis to explain her pain. Pain may be radiating from her back. Can try 12 day prednisone taper if she likes.

## 2017-01-30 NOTE — Addendum Note (Signed)
Addended by: Arnette Norris on: 01/30/2017 01:46 PM   Modules accepted: Orders

## 2017-01-30 NOTE — Telephone Encounter (Signed)
Patient advised and she would like to proceed with Prednisone taper and she also would like to have Norco refilled, patient states she is out and has had to take it due to severe pain-aa

## 2017-04-21 ENCOUNTER — Telehealth: Payer: Self-pay | Admitting: Emergency Medicine

## 2017-04-21 NOTE — Telephone Encounter (Signed)
Pt called she has gotten into some fleas and her legs are swollen wants an antibiotic called in for this. She works in [employer] and can not get in. I advised her that we do not normally do this but she wanted me to send back a message. I informed her about our hours tomorrow as well.

## 2017-05-05 ENCOUNTER — Telehealth: Payer: Self-pay | Admitting: Family Medicine

## 2017-05-05 NOTE — Telephone Encounter (Signed)
Please review. Thanks!  

## 2017-05-05 NOTE — Telephone Encounter (Signed)
Pt states her legs are red, itching and swollen from flea bites.  The stated about 2 weeks ago.  Pt has been using over the counter cream.  Pt states she can not get off work to come in and is requesting a Rx to help with this.  Erin Sherman.  [phone]

## 2017-05-10 ENCOUNTER — Encounter: Payer: Self-pay | Admitting: Family Medicine

## 2017-05-10 ENCOUNTER — Ambulatory Visit: Payer: BC Managed Care – PPO | Admitting: Family Medicine

## 2017-05-10 VITALS — BP 122/60 | HR 93 | Temp 98.5°F | Resp 16 | Wt 266.0 lb

## 2017-05-10 DIAGNOSIS — J4540 Moderate persistent asthma, uncomplicated: Secondary | ICD-10-CM | POA: Diagnosis not present

## 2017-05-10 DIAGNOSIS — M5442 Lumbago with sciatica, left side: Secondary | ICD-10-CM | POA: Diagnosis not present

## 2017-05-10 DIAGNOSIS — M5441 Lumbago with sciatica, right side: Secondary | ICD-10-CM

## 2017-05-10 DIAGNOSIS — L282 Other prurigo: Secondary | ICD-10-CM

## 2017-05-10 DIAGNOSIS — L03119 Cellulitis of unspecified part of limb: Secondary | ICD-10-CM | POA: Diagnosis not present

## 2017-05-10 MED ORDER — MONTELUKAST SODIUM 10 MG PO TABS: 10.0000 mg | ORAL_TABLET | Freq: Every day | ORAL | 5 refills | Status: DC

## 2017-05-10 MED ORDER — DOXYCYCLINE HYCLATE 100 MG PO TABS: 100.0000 mg | ORAL_TABLET | Freq: Two times a day (BID) | ORAL | 0 refills | Status: AC

## 2017-05-10 MED ORDER — TRIAMCINOLONE ACETONIDE 0.5 % EX CREA: 1.0000 "application " | TOPICAL_CREAM | Freq: Three times a day (TID) | CUTANEOUS | 0 refills | Status: DC

## 2017-05-10 MED ORDER — HYDROCODONE-ACETAMINOPHEN 5-325 MG PO TABS: 1.0000 | ORAL_TABLET | Freq: Four times a day (QID) | ORAL | 0 refills | Status: DC | PRN

## 2017-05-10 MED ORDER — BUDESONIDE-FORMOTEROL FUMARATE 160-4.5 MCG/ACT IN AERO: 2.0000 | INHALATION_SPRAY | Freq: Two times a day (BID) | RESPIRATORY_TRACT | 12 refills | Status: DC

## 2017-05-10 NOTE — Progress Notes (Signed)
Patient: Erin Sherman Female    DOB: 08/22/1965   51 y.o.   MRN: [id number] Visit Date: 05/10/2017  Today's Provider: Lelon Huh, MD   Chief Complaint  Patient presents with  . Leg Swelling   Subjective:    HPI Lower extremity Edema:  Patient comes in reporting that she sustained multiple flea bites to her lower legs and arms 3 weeks ago. She states since then, she has had severe itching, swelling and pain in both legs. Patient has tried taking Benadryl and applying Hydrocortisone cream and anything else she could think of  to the bites with no relief.she states her legs were very red but have greatly improving. She is also having some chest congestion and cough for a few days.     Allergies  Allergen Reactions  . Latex     swelling  . Sulfa Antibiotics     Hives, itching  . Meperidine Rash     Current Outpatient Medications:  .  albuterol (PROVENTIL HFA) 108 (90 Base) MCG/ACT inhaler, Inhale 2 puffs into the lungs every 6 (six) hours as needed., Disp: 18 g, Rfl: 6 .  BLACK COHOSH PO, Take by mouth daily. Reported on 07/01/2015, Disp: , Rfl:  .  budesonide-formoterol (SYMBICORT) 160-4.5 MCG/ACT inhaler, Inhale 2 puffs into the lungs 2 (two) times daily., Disp: 1 Inhaler, Rfl: 12 .  calcium carbonate (TUMS) 500 MG chewable tablet, Chew 1 tablet by mouth daily., Disp: , Rfl:  .  cetirizine-pseudoephedrine (ZYRTEC-D) 5-120 MG tablet, Take 1 tablet by mouth daily. As needed for allergies, Disp: 30 tablet, Rfl: 6 .  cholecalciferol (VITAMIN D) 1000 UNITS tablet, Take 1,000 Units by mouth daily., Disp: , Rfl:  .  EPINEPHrine (EPIPEN 2-PAK) 0.3 mg/0.3 mL IJ SOAJ injection, Inject 0.3 mLs (0.3 mg total) into the muscle as needed. With allergic reaction, Disp: 1 Device, Rfl: 3 .  ipratropium-albuterol (DUONEB) 0.5-2.5 (3) MG/3ML SOLN, Inhale 3 mLs into the lungs every 6 (six) hours as needed. Reported on 12/28/2015, Disp: 360 mL, Rfl: 3 .  montelukast (SINGULAIR) 10 MG  tablet, Take 1 tablet (10 mg total) by mouth at bedtime., Disp: 30 tablet, Rfl: 5 .  Multiple Vitamins-Minerals (MULTIVITAMIN & MINERAL PO), Take 1 tablet by mouth daily. Reported on 11/17/2015, Disp: , Rfl:  .  Omega-3 1000 MG CAPS, Take by mouth., Disp: , Rfl:  .  promethazine (PHENERGAN) 25 MG tablet, Take 1 tablet (25 mg total) by mouth every 8 (eight) hours as needed. Reported on 12/28/2015, Disp: 20 tablet, Rfl: 0 .  Spacer/Aero-Holding Chambers ([place]) MISC, 1 each by Other route once. Always uses her when you're using a metered-dose inhaler. You've aromatase medicine as much, he won't have his much side effect, but you it twice as much medicine and your lungs., Disp: 1 each, Rfl: 0 .  HYDROcodone-acetaminophen (NORCO/VICODIN) 5-325 MG tablet, Take 1 tablet by mouth every 6 (six) hours as needed for severe pain. Patient request brand (Patient not taking: Reported on 05/10/2017), Disp: 30 tablet, Rfl: 0 .  mometasone (NASONEX) 50 MCG/ACT nasal spray, Place 2 sprays into the nose daily., Disp: 17 g, Rfl: 2 .  naproxen (NAPROSYN) 500 MG tablet, Take 1 tablet (500 mg total) by mouth 2 (two) times daily with a meal. (Patient not taking: Reported on 05/10/2017), Disp: 30 tablet, Rfl: 0  Review of Systems  Constitutional: Negative for appetite change, chills, fatigue and fever.  Respiratory: Negative for chest tightness and shortness  of breath.   Cardiovascular: Positive for leg swelling. Negative for chest pain and palpitations.  Gastrointestinal: Negative for abdominal pain, nausea and vomiting.  Musculoskeletal: Positive for myalgias.  Skin:       Itchy legs  Neurological: Negative for dizziness and weakness.    Social History   Tobacco Use  . Smoking status: Former Smoker    Packs/day: 0.30    Years: 25.00    Pack years: 7.50    Types: Cigarettes    Last attempt to quit: 07/16/2008    Years since quitting: 8.8  . Smokeless tobacco: Never Used  Substance Use Topics  .  Alcohol use: Yes    Comment: occasional beer.   Objective:   BP 122/60 (BP Location: Right Arm, Patient Position: Sitting, Cuff Size: Large)   Pulse 93   Temp 98.5 F (36.9 C)   Resp 16   Wt 266 lb (120.7 kg)   SpO2 97% Comment: room air  BMI 51.95 kg/m  Vitals:   05/10/17 1548  BP: 122/60  Pulse: 93  Resp: 16  Temp: 98.5 F (36.9 C)  SpO2: 97%  Weight: 266 lb (120.7 kg)     Physical Exam  General Appearance:    Alert, cooperative, no distress  HENT:   ENT exam normal, no neck nodes or sinus tenderness  Eyes:    PERRL, conjunctiva/corneas clear, EOM's intact       Lungs:     Clear to auscultation bilaterally, respirations unlabored  Heart:    Regular rate and rhythm  Skin:   Scattered dry papular lesions across front of both legs and shoulders. A few with small area of surrounding erythema.            Assessment & Plan:     1. Papular urticaria  - triamcinolone cream (KENALOG) 0.5 %; Apply 1 application 3 (three) times daily topically.  Dispense: 45 g; Refill: 0 - montelukast (SINGULAIR) 10 MG tablet; Take 1 tablet (10 mg total) at bedtime by mouth.  Dispense: 30 tablet; Refill: 5  2. Asthma, chronic, moderate persistent, uncomplicated  - doxycycline (VIBRA-TABS) 100 MG tablet; Take 1 tablet (100 mg total) 2 (two) times daily for 7 days by mouth.  Dispense: 14 tablet; Refill: 0 - montelukast (SINGULAIR) 10 MG tablet; Take 1 tablet (10 mg total) at bedtime by mouth.  Dispense: 30 tablet; Refill: 5 - budesonide-formoterol (SYMBICORT) 160-4.5 MCG/ACT inhaler; Inhale 2 puffs 2 (two) times daily into the lungs.  Dispense: 1 Inhaler; Refill: 12  3. Bilateral low back pain with bilateral sciatica, unspecified chronicity  - HYDROcodone-acetaminophen (NORCO/VICODIN) 5-325 MG tablet; Take 1 tablet every 6 (six) hours as needed by mouth for severe pain. Patient request brand  Dispense: 30 tablet; Refill: 0  4. Cellulitis of lower extremity, unspecified laterality  -  doxycycline (VIBRA-TABS) 100 MG tablet; Take 1 tablet (100 mg total) 2 (two) times daily for 7 days by mouth.  Dispense: 14 tablet; Refill: 0       Lelon Huh, MD  [facility]

## 2017-05-11 NOTE — Telephone Encounter (Signed)
Patient seen 05-10-2017

## 2017-07-21 ENCOUNTER — Other Ambulatory Visit: Payer: Self-pay

## 2017-07-21 DIAGNOSIS — J4540 Moderate persistent asthma, uncomplicated: Secondary | ICD-10-CM

## 2017-07-21 DIAGNOSIS — J309 Allergic rhinitis, unspecified: Secondary | ICD-10-CM

## 2017-07-21 MED ORDER — MOMETASONE FUROATE 50 MCG/ACT NA SUSP
2.0000 | Freq: Every day | NASAL | 2 refills | Status: DC
Start: 2017-07-21 — End: 2018-12-07

## 2017-07-21 MED ORDER — ALBUTEROL SULFATE HFA 108 (90 BASE) MCG/ACT IN AERS: 2.0000 | INHALATION_SPRAY | Freq: Four times a day (QID) | RESPIRATORY_TRACT | 6 refills | Status: DC | PRN

## 2017-07-21 MED ORDER — CETIRIZINE-PSEUDOEPHEDRINE ER 5-120 MG PO TB12: 1.0000 | ORAL_TABLET | Freq: Every day | ORAL | 6 refills | Status: DC

## 2017-07-21 NOTE — Telephone Encounter (Signed)
Patient called and states that her asthma is flared up since last week after she had to walk outside in the cold/wind. She has been using her albuterol inhaler, Symbicort, Nasonex and Mucinex to try and get congestion and cough cleared up but it is not working. She is coughing up a little bit of thick phlegm, chest congestion and shortness of breath. She coughed up so much she got choked earlier. No fever or chills. She said that maybe she needs steroid treatment? Also states in the past you sent in Mucinex RX strength that worked better if she needs that?  She also asked for refills on nasonex, Zyrtec D, Albuterol inhaler-these medications are pulled down for review for you. She wants medications to be sent to [facility] Her call back number is [phone] (W)-Eliah Ozawa Estell Harpin, RMA

## 2017-08-04 ENCOUNTER — Encounter: Payer: Self-pay | Admitting: Family Medicine

## 2017-08-04 ENCOUNTER — Ambulatory Visit (INDEPENDENT_AMBULATORY_CARE_PROVIDER_SITE_OTHER): Payer: BC Managed Care – PPO | Admitting: Family Medicine

## 2017-08-04 VITALS — BP 100/60 | HR 93 | Temp 98.2°F | Resp 16 | Wt 262.0 lb

## 2017-08-04 DIAGNOSIS — M5441 Lumbago with sciatica, right side: Secondary | ICD-10-CM

## 2017-08-04 DIAGNOSIS — M7061 Trochanteric bursitis, right hip: Secondary | ICD-10-CM | POA: Diagnosis not present

## 2017-08-04 DIAGNOSIS — J45901 Unspecified asthma with (acute) exacerbation: Secondary | ICD-10-CM | POA: Diagnosis not present

## 2017-08-04 DIAGNOSIS — F439 Reaction to severe stress, unspecified: Secondary | ICD-10-CM | POA: Diagnosis not present

## 2017-08-04 DIAGNOSIS — M5442 Lumbago with sciatica, left side: Secondary | ICD-10-CM | POA: Diagnosis not present

## 2017-08-04 DIAGNOSIS — J329 Chronic sinusitis, unspecified: Secondary | ICD-10-CM

## 2017-08-04 MED ORDER — HYDROCODONE-ACETAMINOPHEN 5-325 MG PO TABS: 1.0000 | ORAL_TABLET | Freq: Four times a day (QID) | ORAL | 0 refills | Status: DC | PRN

## 2017-08-04 MED ORDER — MOMETASONE FURO-FORMOTEROL FUM 100-5 MCG/ACT IN AERO: 2.0000 | INHALATION_SPRAY | Freq: Two times a day (BID) | RESPIRATORY_TRACT | 0 refills | Status: DC

## 2017-08-04 MED ORDER — AMOXICILLIN-POT CLAVULANATE 875-125 MG PO TABS: 1.0000 | ORAL_TABLET | Freq: Two times a day (BID) | ORAL | 0 refills | Status: AC

## 2017-08-04 MED ORDER — ALPRAZOLAM 0.5 MG PO TABS: 0.5000 mg | ORAL_TABLET | ORAL | 0 refills | Status: DC | PRN

## 2017-08-04 MED ORDER — METHYLPREDNISOLONE ACETATE 80 MG/ML IJ SUSP: 80.0000 mg | Freq: Once | INTRAMUSCULAR | Status: DC

## 2017-08-04 NOTE — Patient Instructions (Signed)
Trochanteric Bursitis Trochanteric bursitis is a condition that causes hip pain. Trochanteric bursitis happens when fluid-filled sacs (bursae) in the hip get irritated. Normally these sacs absorb shock and help strong bands of tissue (tendons) in your hip glide smoothly over each other and over your hip bones. What are the causes? This condition results from increased friction between the hip bones and the tendons that go over them. This condition can happen if you:  Have weak hips.  Use your hip muscles too much (overuse).  Get hit in the hip.  What increases the risk? This condition is more likely to develop in:  Women.  Adults who are middle-aged or older.  People with arthritis or a spinal condition.  People with weak buttocks muscles (gluteal muscles).  People who have one leg that is shorter than the other.  People who participate in certain kinds of athletic activities, such as: ? Running sports, especially long-distance running. ? Contact sports, like football or martial arts. ? Sports in which falls may occur, like skiing.  What are the signs or symptoms? The main symptom of this condition is pain and tenderness over the point of your hip. The pain may be:  Sharp and intense.  Dull and achy.  Felt on the outside of your thigh.  It may increase when you:  Lie on your side.  Walk or run.  Go up on stairs.  Sit.  Stand up after sitting.  Stand for long periods of time.  How is this diagnosed? This condition may be diagnosed based on:  Your symptoms.  Your medical history.  A physical exam.  Imaging tests, such as: ? X-rays to check your bones. ? An MRI or ultrasound to check your tendons and muscles.  During your physical exam, your health care provider will check the movement and strength of your hip. He or she may press on the point of your hip to check for pain. How is this treated? This condition may be treated by:  Resting.  Reducing  your activity.  Avoiding activities that cause pain.  Using crutches, a cane, or a walker to decrease the strain on your hip.  Taking medicine to help with swelling.  Having medicine injected into the bursae to help with swelling.  Using ice, heat, and massage therapy for pain relief.  Physical therapy exercises for strength and flexibility.  Surgery (rare).  Follow these instructions at home: Activity  Rest.  Avoid activities that cause pain.  Return to your normal activities as told by your health care provider. Ask your health care provider what activities are safe for you. Managing pain, stiffness, and swelling  Take over-the-counter and prescription medicines only as told by your health care provider.  If directed, apply heat to the injured area as told by your health care provider. ? Place a towel between your skin and the heat source. ? Leave the heat on for 20-30 minutes. ? Remove the heat if your skin turns bright red. This is especially important if you are unable to feel pain, heat, or cold. You may have a greater risk of getting burned.  If directed, apply ice to the injured area: ? Put ice in a plastic bag. ? Place a towel between your skin and the bag. ? Leave the ice on for 20 minutes, 2-3 times a day. General instructions  If the affected leg is one that you use for driving, ask your health care provider when it is safe to drive.    Use crutches, a cane, or a walker as told by your health care provider.  If one of your legs is shorter than the other, get fitted for a shoe insert.  Lose weight if you are overweight. How is this prevented?  Wear supportive footwear that is appropriate for your sport.  If you have hip pain, start any new exercise or sport slowly.  Maintain physical fitness, including: ? Strength. ? Flexibility. Contact a health care provider if:  Your pain does not improve with 2-4 weeks. Get help right away if:  You develop  severe pain.  You have a fever.  You develop increased redness over your hip.  You have a change in your bowel function or bladder function.  You cannot control the muscles in your feet. This information is not intended to replace advice given to you by your health care provider. Make sure you discuss any questions you have with your health care provider. Document Released: 07/28/2004 Document Revised: 02/24/2016 Document Reviewed: 06/05/2015 Elsevier Interactive Patient Education  2018 [employer] Inc[employer]  

## 2017-08-04 NOTE — Progress Notes (Signed)
Patient: Erin Sherman Female    DOB: [birth year]   52 y.o.   MRN: [id number] Visit Date: 08/04/2017  Today's Provider: Lelon Huh, MD   Chief Complaint  Patient presents with  . Facial Swelling  . Hip Pain   Subjective:    HPI  She is very anxious today due her father passing away from cancer 3 days ago. Has had episodes of feeling like everything is closing in on her making her feel very panicky. States she had similar sx when her brother died several years ago and was prescribed prn medication which helped. She woul  Patient states she has had pain andswelling in face due to filling of left upper.tooth falling out.     She also states she has had chest congestion, shortness of breath, chest heaviness and wheezing for 5 days.   She also complains of persistent pain on right side of her hip for years. Had steroid injection for trochanteric bursitis in the past which she states resolved pain for several weeks.  Has tried OTC tylenol and ibuprofen without reelief.    Allergies  Allergen Reactions  . Latex     swelling  . Sulfa Antibiotics     Hives, itching  . Meperidine Rash     Current Outpatient Medications:  .  albuterol (PROVENTIL HFA) 108 (90 Base) MCG/ACT inhaler, Inhale 2 puffs into the lungs every 6 (six) hours as needed., Disp: 18 g, Rfl: 6 .  BLACK COHOSH PO, Take by mouth daily. Reported on 07/01/2015, Disp: , Rfl:  .  budesonide-formoterol (SYMBICORT) 160-4.5 MCG/ACT inhaler, Inhale 2 puffs 2 (two) times daily into the lungs., Disp: 1 Inhaler, Rfl: 12 .  calcium carbonate (TUMS) 500 MG chewable tablet, Chew 1 tablet by mouth daily., Disp: , Rfl:  .  cetirizine-pseudoephedrine (ZYRTEC-D) 5-120 MG tablet, Take 1 tablet by mouth daily. As needed for allergies, Disp: 30 tablet, Rfl: 6 .  cholecalciferol (VITAMIN D) 1000 UNITS tablet, Take 1,000 Units by mouth daily., Disp: , Rfl:  .  EPINEPHrine (EPIPEN 2-PAK) 0.3 mg/0.3 mL IJ SOAJ injection, Inject 0.3  mLs (0.3 mg total) into the muscle as needed. With allergic reaction, Disp: 1 Device, Rfl: 3 .  HYDROcodone-acetaminophen (NORCO/VICODIN) 5-325 MG tablet, Take 1 tablet every 6 (six) hours as needed by mouth for severe pain. Patient request brand, Disp: 30 tablet, Rfl: 0 .  ipratropium-albuterol (DUONEB) 0.5-2.5 (3) MG/3ML SOLN, Inhale 3 mLs into the lungs every 6 (six) hours as needed. Reported on 12/28/2015, Disp: 360 mL, Rfl: 3 .  mometasone (NASONEX) 50 MCG/ACT nasal spray, Place 2 sprays into the nose daily., Disp: 17 g, Rfl: 2 .  montelukast (SINGULAIR) 10 MG tablet, Take 1 tablet (10 mg total) at bedtime by mouth., Disp: 30 tablet, Rfl: 5 .  Multiple Vitamins-Minerals (MULTIVITAMIN & MINERAL PO), Take 1 tablet by mouth daily. Reported on 11/17/2015, Disp: , Rfl:  .  naproxen (NAPROSYN) 500 MG tablet, Take 1 tablet (500 mg total) by mouth 2 (two) times daily with a meal., Disp: 30 tablet, Rfl: 0 .  Omega-3 1000 MG CAPS, Take by mouth., Disp: , Rfl:  .  promethazine (PHENERGAN) 25 MG tablet, Take 1 tablet (25 mg total) by mouth every 8 (eight) hours as needed. Reported on 12/28/2015, Disp: 20 tablet, Rfl: 0 .  Spacer/Aero-Holding Chambers ([place]) MISC, 1 each by Other route once. Always uses her when you're using a metered-dose inhaler. You've aromatase medicine as much, he  won't have his much side effect, but you it twice as much medicine and your lungs., Disp: 1 each, Rfl: 0 .  triamcinolone cream (KENALOG) 0.5 %, Apply 1 application 3 (three) times daily topically., Disp: 45 g, Rfl: 0  Review of Systems  Constitutional: Negative for appetite change, chills, fatigue and fever.  HENT: Positive for congestion.   Respiratory: Positive for wheezing. Negative for chest tightness and shortness of breath.   Cardiovascular: Negative for chest pain and palpitations.  Gastrointestinal: Negative for abdominal pain, nausea and vomiting.  Musculoskeletal: Positive for arthralgias.    Neurological: Negative for dizziness and weakness.    Social History   Tobacco Use  . Smoking status: Former Smoker    Packs/day: 0.30    Years: 25.00    Pack years: 7.50    Types: Cigarettes    Last attempt to quit: 07/16/2008    Years since quitting: 9.0  . Smokeless tobacco: Never Used  Substance Use Topics  . Alcohol use: Yes    Comment: occasional beer.   Objective:   BP 100/60 (BP Location: Right Arm, Patient Position: Sitting, Cuff Size: Large)   Pulse 93   Temp 98.2 F (36.8 C) (Oral)   Resp 16   Wt 262 lb (118.8 kg)   SpO2 98%   BMI 51.17 kg/m  Vitals:   08/04/17 1018  BP: 100/60  Pulse: 93  Resp: 16  Temp: 98.2 F (36.8 C)  TempSrc: Oral  SpO2: 98%  Weight: 262 lb (118.8 kg)     Physical Exam  General Appearance:    Alert, cooperative, no distress  HENT:   bilateral TM normal without fluid or infection, neck without nodes, frontal sinus tender and nasal mucosa congested  Eyes:    PERRL, conjunctiva/corneas clear, EOM's intact       Lungs:     Occasional expiratory wheeze, no rales, , respirations unlabored  Heart:    Regular rate and rhythm  Neurologic:   Awake, alert, oriented x 3. No apparent focal neurological           defect.           Assessment & Plan:     1. Situational stress Occasional panic type episodes which should respond well to pen ALPRAZolam (XANAX) 0.5 MG tablet; Take 1 tablet (0.5 mg total) by mouth every 4 (four) hours as needed.  Dispense: 30 tablet; Refill: 0  2. Trochanteric bursitis of right hip Prepped skin over right trochanter with isopropyl alcohol and injected with 39ml- methylPREDNISolone acetate (DEPO-MEDROL) injection 80 mg. Tolerated well. No bleeding. Applied adhesive bandage.   3. Mild asthma with exacerbation, unspecified whether persistent  - amoxicillin-clavulanate (AUGMENTIN) 875-125 MG tablet; Take 1 tablet by mouth 2 (two) times daily for 10 days. Take with food  Dispense: 20 tablet; Refill: 0 -  mometasone-formoterol (DULERA) 100-5 MCG/ACT AERO; Inhale 2 puffs into the lungs 2 (two) times daily for 7 days.  Dispense: 1 Inhaler; Refill: 0  4. Bilateral low back pain with bilateral sciatica, unspecified chronicity Responds well to prn HYDROcodone-acetaminophen (NORCO/VICODIN) 5-325 MG tablet; Take 1 tablet by mouth every 6 (six) hours as needed for severe pain. Patient request brand  Dispense: 30 tablet; Refill: 0  5. Sinusitis, unspecified chronicity, unspecified location        Lelon Huh, MD  [facility]

## 2017-08-07 ENCOUNTER — Telehealth: Payer: Self-pay | Admitting: Family Medicine

## 2017-08-07 NOTE — Telephone Encounter (Signed)
Called the pharmacy and they found the Rx. Advised the patient.

## 2017-08-07 NOTE — Telephone Encounter (Signed)
[facility] [place] and they do not have her RX for Norco.  Can you please send another RX to them? Patient is waiting.

## 2017-08-10 ENCOUNTER — Other Ambulatory Visit: Payer: Self-pay

## 2017-08-10 ENCOUNTER — Encounter: Payer: Self-pay | Admitting: Emergency Medicine

## 2017-08-10 ENCOUNTER — Emergency Department
Admission: EM | Admit: 2017-08-10 | Discharge: 2017-08-10 | Disposition: A | Discharge status: Home / Self Care | Payer: BC Managed Care – PPO | Attending: Emergency Medicine | Admitting: Emergency Medicine

## 2017-08-10 DIAGNOSIS — Z8541 Personal history of malignant neoplasm of cervix uteri: Secondary | ICD-10-CM | POA: Insufficient documentation

## 2017-08-10 DIAGNOSIS — Z91040 Latex allergy status: Secondary | ICD-10-CM | POA: Insufficient documentation

## 2017-08-10 DIAGNOSIS — Z87891 Personal history of nicotine dependence: Secondary | ICD-10-CM | POA: Diagnosis not present

## 2017-08-10 DIAGNOSIS — J45909 Unspecified asthma, uncomplicated: Secondary | ICD-10-CM | POA: Diagnosis not present

## 2017-08-10 DIAGNOSIS — Z79899 Other long term (current) drug therapy: Secondary | ICD-10-CM | POA: Diagnosis not present

## 2017-08-10 DIAGNOSIS — G43909 Migraine, unspecified, not intractable, without status migrainosus: Secondary | ICD-10-CM | POA: Diagnosis not present

## 2017-08-10 MED ORDER — PROCHLORPERAZINE MALEATE 10 MG PO TABS: 10.0000 mg | ORAL_TABLET | Freq: Four times a day (QID) | ORAL | 0 refills | Status: DC | PRN

## 2017-08-10 MED ORDER — SODIUM CHLORIDE 0.9 % IV BOLUS (SEPSIS)
1000.0000 mL | Freq: Once | INTRAVENOUS | Status: AC
  Administered 2017-08-10: 1000 mL via INTRAVENOUS

## 2017-08-10 MED ORDER — PROCHLORPERAZINE EDISYLATE 5 MG/ML IJ SOLN
10.0000 mg | Freq: Once | INTRAMUSCULAR | Status: AC
  Administered 2017-08-10: 10 mg via INTRAVENOUS
  Filled 2017-08-10: qty 2

## 2017-08-10 MED ORDER — KETOROLAC TROMETHAMINE 30 MG/ML IJ SOLN
30.0000 mg | Freq: Once | INTRAMUSCULAR | Status: AC
  Administered 2017-08-10: 30 mg via INTRAVENOUS
  Filled 2017-08-10: qty 1

## 2017-08-10 NOTE — ED Provider Notes (Signed)
[facility] Emergency Department Provider Note  ____________________________________________   I have reviewed the triage vital signs and the nursing notes.   HISTORY  Chief Complaint Headache and Abdominal Pain   History limited by: Not Limited   HPI Erin Sherman is a 52 y.o. female who presents to the emergency department today because of concern for migraine.  Is located on the left side of her head.  It is been present for the past 6 days.  It is constant.  It is severe.  It feels like her previous migraines.  She has been taking some medication at home without any relief.  States that she recently lost her father in thinks that the stress brought on the migraine.  She has had associated photophobia.  In addition she has had associated nausea which she says she typically gets when she gets bad migraines.  She denies any recent fevers.  No vomiting or change in bowel movements.  No bloody stool.  Per medical record review patient has a history of migraines  Past Medical History:  Diagnosis Date  . Cervical cancer ([place])   . Hx of bronchitis   . Migraines   . Stomach ulcer     Patient Active Problem List   Diagnosis Date Noted  . Hyperplastic polyp of sigmoid colon 11/29/2016  . Screening for colon cancer   . Polyp of sigmoid colon   . Itching 11/17/2015  . Trochanteric bursitis of right hip 07/01/2015  . Low back pain 07/01/2015  . DDD (degenerative disc disease), lumbar 05/15/2015  . Carpal tunnel syndrome 05/07/2015  . Hip pain 05/07/2015  . History of cervical cancer 05/07/2015  . HLD (hyperlipidemia) 05/07/2015  . Menopausal symptom 05/07/2015  . Obesity 05/07/2015  . 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 Urinary incontinence 05/07/2015  . Nausea 05/07/2015  . Asthma, chronic 07/16/2013  . Allergic rhinitis 07/16/2013  . GERD (gastroesophageal reflux disease) 07/16/2013    Past Surgical History:  Procedure Laterality Date  . ABDOMINAL  HYSTERECTOMY     with unilateral oophorectomy and part of cervix removed due to cervical cancer  . CARPAL TUNNEL RELEASE Right 2013  . CARPAL TUNNEL RELEASE  03/02/2010   Endoscopic carpal tunnel release. Rica Mast, MD (Orthopedic, [facility])  . CESAREAN SECTION    . COLONOSCOPY WITH PROPOFOL N/A 11/29/2016   Procedure: COLONOSCOPY WITH PROPOFOL;  Surgeon: [name], MD;  Location: [facility] ENDOSCOPY;  Service: Endoscopy;  Laterality: N/A;  . exercise treadmill Test  05/31/2006   Normal  . TUBAL LIGATION      Prior to Admission medications   Medication Sig Start Date End Date Taking? Authorizing Provider  albuterol (PROVENTIL HFA) 108 (90 Base) MCG/ACT inhaler Inhale 2 puffs into the lungs every 6 (six) hours as needed. 07/21/17   Birdie Sons, MD  ALPRAZolam Duanne Moron) 0.5 MG tablet Take 1 tablet (0.5 mg total) by mouth every 4 (four) hours as needed. 08/04/17   Birdie Sons, MD  amoxicillin-clavulanate (AUGMENTIN) 875-125 MG tablet Take 1 tablet by mouth 2 (two) times daily for 10 days. Take with food 08/04/17 08/14/17  Birdie Sons, MD  BLACK COHOSH PO Take by mouth daily. Reported on 07/01/2015    [provider]  budesonide-formoterol (SYMBICORT) 160-4.5 MCG/ACT inhaler Inhale 2 puffs 2 (two) times daily into the lungs. 05/10/17   Birdie Sons, MD  calcium carbonate (TUMS) 500 MG chewable tablet Chew 1 tablet by mouth daily.    [provider]  cetirizine-pseudoephedrine (ZYRTEC-D) 5-120 MG tablet Take 1 tablet by mouth daily. As needed for allergies 07/21/17   Birdie Sons, MD  cholecalciferol (VITAMIN D) 1000 UNITS tablet Take 1,000 Units by mouth daily.    [provider]  EPINEPHrine (EPIPEN 2-PAK) 0.3 mg/0.3 mL IJ SOAJ injection Inject 0.3 mLs (0.3 mg total) into the muscle as needed. With allergic reaction 09/29/16   Mar Daring, PA-C  HYDROcodone-acetaminophen (NORCO/VICODIN) 5-325 MG tablet Take 1 tablet by mouth every 6 (six) hours as  needed for severe pain. Patient request brand 08/04/17   Birdie Sons, MD  ipratropium-albuterol (DUONEB) 0.5-2.5 (3) MG/3ML SOLN Inhale 3 mLs into the lungs every 6 (six) hours as needed. Reported on 12/28/2015 12/28/15   Mar Daring, PA-C  mometasone (NASONEX) 50 MCG/ACT nasal spray Place 2 sprays into the nose daily. 07/21/17 07/21/18  Birdie Sons, MD  mometasone-formoterol (DULERA) 100-5 MCG/ACT AERO Inhale 2 puffs into the lungs 2 (two) times daily for 7 days. 08/04/17 08/11/17  Birdie Sons, MD  montelukast (SINGULAIR) 10 MG tablet Take 1 tablet (10 mg total) at bedtime by mouth. 05/10/17   Birdie Sons, MD  Multiple Vitamins-Minerals (MULTIVITAMIN & MINERAL PO) Take 1 tablet by mouth daily. Reported on 11/17/2015    [provider]  naproxen (NAPROSYN) 500 MG tablet Take 1 tablet (500 mg total) by mouth 2 (two) times daily with a meal. 11/04/16   Birdie Sons, MD  Omega-3 1000 MG CAPS Take by mouth.    [provider]  promethazine (PHENERGAN) 25 MG tablet Take 1 tablet (25 mg total) by mouth every 8 (eight) hours as needed. Reported on 12/28/2015 01/20/17   Birdie Sons, MD  Spacer/Aero-Holding Chambers ([place]) [place] 1 each by Other route once. Always uses her when you're using a metered-dose inhaler. You've aromatase medicine as much, he won't have his much side effect, but you it twice as much medicine and your lungs. 05/24/15   Ahmed Prima, MD  triamcinolone cream (KENALOG) 0.5 % Apply 1 application 3 (three) times daily topically. 05/10/17   Birdie Sons, MD    Allergies Latex; Sulfa antibiotics; and Meperidine  Family History  Problem Relation Age of Onset  . Emphysema Mother   . Asthma Mother   . Diabetes Mother        type 2  . COPD Mother   . Hypertension Mother   . Emphysema Father   . Hypertension Father   . COPD Father   . Colon cancer Father        died of colon cancer 08-21-2017  . Asthma Sister   . Asthma  Brother   . Heart attack Brother   . Heart attack Maternal Grandmother   . Heart attack Paternal Grandmother   . Anemia Sister   . Asthma Son   . Cancer Maternal Grandfather        prostate  . Cancer Maternal Aunt        breast  . Cancer Other     Social History Social History   Tobacco Use  . Smoking status: Former Smoker    Packs/day: 0.30    Years: 25.00    Pack years: 7.50    Types: Cigarettes    Last attempt to quit: 07/16/2008    Years since quitting: 9.0  . Smokeless tobacco: Never Used  Substance Use Topics  . Alcohol use: Yes    Comment: occasional beer.  . Drug use:  No    Review of Systems Constitutional: No fever/chills Eyes: Positive for photophobia. ENT: No sore throat. Cardiovascular: Denies chest pain. Respiratory: Denies shortness of breath. Gastrointestinal: No abdominal pain.  Positive for nausea.  Genitourinary: Negative for dysuria. Musculoskeletal: Negative for back pain. Skin: Negative for rash. Neurological: Positive for headache.  ____________________________________________   PHYSICAL EXAM:  VITAL SIGNS: ED Triage Vitals  Enc Vitals Group     BP 08/10/17 0715 (!) 124/45     Pulse Rate 08/10/17 0715 90     Resp 08/10/17 0715 18     Temp 08/10/17 0715 98.1 F (36.7 C)     Temp Source 08/10/17 0715 Oral     SpO2 --      Weight 08/10/17 0716 262 lb (118.8 kg)     Height 08/10/17 0716 5' (1.524 m)     Head Circumference --      Peak Flow --      Pain Score 08/10/17 0716 10    Constitutional: Alert and oriented. Well appearing and in no distress. Eyes: Conjunctivae are normal.  ENT   Head: Normocephalic and atraumatic.   Nose: No congestion/rhinnorhea.   Mouth/Throat: Mucous membranes are moist.   Neck: No stridor. Hematological/Lymphatic/Immunilogical: No cervical lymphadenopathy. Cardiovascular: Normal rate, regular rhythm.  No murmurs, rubs, or gallops.  Respiratory: Normal respiratory effort without tachypnea  nor retractions. Breath sounds are clear and equal bilaterally. No wheezes/rales/rhonchi. Gastrointestinal: Soft and non tender. No rebound. No guarding.  Genitourinary: Deferred Musculoskeletal: Normal range of motion in all extremities. No lower extremity edema. Neurologic:  Normal speech and language. No gross focal neurologic deficits are appreciated.  Skin:  Skin is warm, dry and intact. No rash noted. Psychiatric: Mood and affect are normal. Speech and behavior are normal. Patient exhibits appropriate insight and judgment.  ____________________________________________    LABS (pertinent positives/negatives)  None  ____________________________________________   EKG  None  ____________________________________________    RADIOLOGY  None  ____________________________________________   PROCEDURES  Procedures  ____________________________________________   INITIAL IMPRESSION / ASSESSMENT AND PLAN / ED COURSE  Pertinent labs & imaging results that were available during my care of the patient were reviewed by me and considered in my medical decision making (see chart for details).  Patient with history of migraines who presents to the emergency department today because of concern for migraine.  Given that she has a history and this feels similar to previous migraines do not feel any emergent neuroimaging is necessary.  Patient was given IV fluids and medications and feel improvement.  At the time of discharge she felt comfortable going home and managing it at home.  Will discharge with prescription for Compazine.  ____________________________________________   FINAL CLINICAL IMPRESSION(S) / ED DIAGNOSES  Final diagnoses:  Migraine without status migrainosus, not intractable, unspecified migraine type     Note: This dictation was prepared with Dragon dictation. Any transcriptional errors that result from this process are unintentional     Nance Pear,  MD 08/10/17 [phone]

## 2017-08-10 NOTE — Discharge Instructions (Signed)
Please seek medical attention for any high fevers, chest pain, shortness of breath, change in behavior, persistent vomiting, bloody stool or any other new or concerning symptoms.  

## 2017-08-10 NOTE — ED Triage Notes (Signed)
States she developed headache about 6 days ago  Was seen by her PCP on Friday  Given hydrocodone   States min relief  But last pm states headache became worse  Pain is mainly to left side of head  Positive photphobia

## 2017-10-04 ENCOUNTER — Other Ambulatory Visit: Payer: Self-pay | Admitting: Family Medicine

## 2017-10-04 DIAGNOSIS — J4540 Moderate persistent asthma, uncomplicated: Secondary | ICD-10-CM

## 2017-10-04 DIAGNOSIS — L282 Other prurigo: Secondary | ICD-10-CM

## 2017-10-04 MED ORDER — MONTELUKAST SODIUM 10 MG PO TABS: 10.0000 mg | ORAL_TABLET | Freq: Every day | ORAL | 5 refills | Status: DC

## 2017-10-04 NOTE — Telephone Encounter (Signed)
t needs refill on her on "sinus medication"  She takes this at night.  She does not know which one.  Pt call back [phone]   [facility]   Thanks teri

## 2017-10-09 ENCOUNTER — Encounter: Payer: Self-pay | Admitting: *Deleted

## 2017-10-09 ENCOUNTER — Ambulatory Visit (INDEPENDENT_AMBULATORY_CARE_PROVIDER_SITE_OTHER): Payer: BC Managed Care – PPO | Admitting: Family Medicine

## 2017-10-09 ENCOUNTER — Encounter: Payer: Self-pay | Admitting: Family Medicine

## 2017-10-09 VITALS — BP 118/60 | HR 97 | Temp 98.6°F | Resp 16 | Wt 260.0 lb

## 2017-10-09 DIAGNOSIS — M5136 Other intervertebral disc degeneration, lumbar region: Secondary | ICD-10-CM | POA: Diagnosis not present

## 2017-10-09 DIAGNOSIS — J301 Allergic rhinitis due to pollen: Secondary | ICD-10-CM | POA: Diagnosis not present

## 2017-10-09 DIAGNOSIS — R110 Nausea: Secondary | ICD-10-CM

## 2017-10-09 DIAGNOSIS — M5441 Lumbago with sciatica, right side: Secondary | ICD-10-CM

## 2017-10-09 DIAGNOSIS — J329 Chronic sinusitis, unspecified: Secondary | ICD-10-CM | POA: Diagnosis not present

## 2017-10-09 DIAGNOSIS — M5442 Lumbago with sciatica, left side: Secondary | ICD-10-CM

## 2017-10-09 DIAGNOSIS — J40 Bronchitis, not specified as acute or chronic: Secondary | ICD-10-CM | POA: Diagnosis not present

## 2017-10-09 DIAGNOSIS — J4540 Moderate persistent asthma, uncomplicated: Secondary | ICD-10-CM

## 2017-10-09 MED ORDER — MONTELUKAST SODIUM 10 MG PO TABS: 10.0000 mg | ORAL_TABLET | Freq: Every day | ORAL | 5 refills | Status: DC

## 2017-10-09 MED ORDER — PROMETHAZINE HCL 25 MG PO TABS: 25.0000 mg | ORAL_TABLET | Freq: Three times a day (TID) | ORAL | 0 refills | Status: DC | PRN

## 2017-10-09 MED ORDER — PREDNISONE 10 MG PO TABS: ORAL_TABLET | ORAL | 0 refills | Status: AC

## 2017-10-09 MED ORDER — AZITHROMYCIN 250 MG PO TABS: ORAL_TABLET | ORAL | 0 refills | Status: AC

## 2017-10-09 MED ORDER — HYDROCODONE-ACETAMINOPHEN 5-325 MG PO TABS: 1.0000 | ORAL_TABLET | Freq: Four times a day (QID) | ORAL | 0 refills | Status: DC | PRN

## 2017-10-09 NOTE — Progress Notes (Signed)
Patient: Erin Sherman Female    DOB: 06/19/1966   52 y.o.   MRN: [id number] Visit Date: 10/09/2017  Today's Provider: Lelon Huh, MD   Chief Complaint  Patient presents with  . Asthma  . Cough   Subjective:    Patient states she has had congestion and cough for 6 days. Patient also has symptoms of ear pain and congestion. Other symptoms includes: runny nose, headache, shortness of breath and wheezing. Patient has been taking mucinex and Nasanex.   Cough  This is a new problem. The current episode started in the past 7 days (6 days). The problem has been gradually worsening. The problem occurs every few minutes. The cough is productive of sputum. Associated symptoms include ear congestion, ear pain, headaches, nasal congestion, rhinorrhea, sweats and wheezing. Pertinent negatives include no chest pain, chills, fever, heartburn, hemoptysis, myalgias, postnasal drip, rash, sore throat, shortness of breath or weight loss. The symptoms are aggravated by exercise, lying down and pollens. Treatments tried: mucinex, nasanex. The treatment provided mild relief. Her past medical history is significant for asthma.  Using albuterol inhaler twice daily and nebulizer twice daily. Cough is productive thick white material and blowiing thick yellow mucous from nose.     Allergies  Allergen Reactions  . Latex     swelling  . Sulfa Antibiotics     Hives, itching  . Meperidine Rash     Current Outpatient Medications:  .  albuterol (PROVENTIL HFA) 108 (90 Base) MCG/ACT inhaler, Inhale 2 puffs into the lungs every 6 (six) hours as needed., Disp: 18 g, Rfl: 6 .  ALPRAZolam (XANAX) 0.5 MG tablet, Take 1 tablet (0.5 mg total) by mouth every 4 (four) hours as needed. (Patient taking differently: Take 0.5 mg by mouth 2 (two) times daily as needed. ), Disp: 30 tablet, Rfl: 0 .  budesonide-formoterol (SYMBICORT) 160-4.5 MCG/ACT inhaler, Inhale 2 puffs 2 (two) times daily into the lungs., Disp:  1 Inhaler, Rfl: 12 .  calcium carbonate (TUMS) 500 MG chewable tablet, Chew 1 tablet by mouth daily., Disp: , Rfl:  .  cetirizine-pseudoephedrine (ZYRTEC-D) 5-120 MG tablet, Take 1 tablet by mouth daily. As needed for allergies, Disp: 30 tablet, Rfl: 6 .  cholecalciferol (VITAMIN D) 1000 UNITS tablet, Take 1,000 Units by mouth daily., Disp: , Rfl:  .  EPINEPHrine (EPIPEN 2-PAK) 0.3 mg/0.3 mL IJ SOAJ injection, Inject 0.3 mLs (0.3 mg total) into the muscle as needed. With allergic reaction, Disp: 1 Device, Rfl: 3 .  HYDROcodone-acetaminophen (NORCO/VICODIN) 5-325 MG tablet, Take 1 tablet by mouth every 6 (six) hours as needed for severe pain. Patient request brand, Disp: 30 tablet, Rfl: 0 .  ipratropium-albuterol (DUONEB) 0.5-2.5 (3) MG/3ML SOLN, Inhale 3 mLs into the lungs every 6 (six) hours as needed. Reported on 12/28/2015, Disp: 360 mL, Rfl: 3 .  mometasone (NASONEX) 50 MCG/ACT nasal spray, Place 2 sprays into the nose daily., Disp: 17 g, Rfl: 2 .  montelukast (SINGULAIR) 10 MG tablet, Take 1 tablet (10 mg total) by mouth at bedtime., Disp: 30 tablet, Rfl: 5 .  Multiple Vitamins-Minerals (MULTIVITAMIN & MINERAL PO), Take 1 tablet by mouth daily. Reported on 11/17/2015, Disp: , Rfl:  .  Omega-3 1000 MG CAPS, Take 1 capsule by mouth. , Disp: , Rfl:  .  promethazine (PHENERGAN) 25 MG tablet, Take 1 tablet (25 mg total) by mouth every 8 (eight) hours as needed. Reported on 12/28/2015, Disp: 20 tablet, Rfl: 0 .  Spacer/Aero-Holding  Chambers ([place]) MISC, 1 each by Other route once. Always uses her when you're using a metered-dose inhaler. You've aromatase medicine as much, he won't have his much side effect, but you it twice as much medicine and your lungs., Disp: 1 each, Rfl: 0 .  triamcinolone cream (KENALOG) 0.5 %, Apply 1 application 3 (three) times daily topically., Disp: 45 g, Rfl: 0  Current Facility-Administered Medications:  .  methylPREDNISolone acetate (DEPO-MEDROL) injection  80 mg, 80 mg, Intramuscular, Once, Fisher, Kirstie Peri, MD  Review of Systems  Constitutional: Negative for appetite change, chills, fatigue, fever and weight loss.  HENT: Positive for congestion, ear pain, rhinorrhea, sinus pressure, sinus pain and sneezing. Negative for postnasal drip and sore throat.   Respiratory: Positive for cough and wheezing. Negative for hemoptysis, chest tightness and shortness of breath.   Cardiovascular: Negative for chest pain and palpitations.  Gastrointestinal: Negative for abdominal pain, heartburn, nausea and vomiting.  Musculoskeletal: Negative for myalgias.  Skin: Negative for rash.  Neurological: Positive for headaches. Negative for dizziness and weakness.    Social History   Tobacco Use  . Smoking status: Former Smoker    Packs/day: 0.30    Years: 25.00    Pack years: 7.50    Types: Cigarettes    Last attempt to quit: 07/16/2008    Years since quitting: 9.2  . Smokeless tobacco: Never Used  Substance Use Topics  . Alcohol use: Yes    Comment: occasional beer.   Objective:   BP 118/60 (BP Location: Right Arm, Patient Position: Sitting, Cuff Size: Large)   Pulse 97   Temp 98.6 F (37 C) (Oral)   Resp 16   Wt 260 lb (117.9 kg)   SpO2 98%   BMI 50.78 kg/m  Vitals:   10/09/17 1056  BP: 118/60  Pulse: 97  Resp: 16  Temp: 98.6 F (37 C)  TempSrc: Oral  SpO2: 98%  Weight: 260 lb (117.9 kg)     Physical Exam  General Appearance:    Alert, cooperative, no distress  HENT:   bilateral TM normal without fluid or infection, neck without nodes, throat normal without erythema or exudate, frontal and maxillary sinus tender and nasal mucosa pale and congested  Eyes:    PERRL, conjunctiva/corneas clear, EOM's intact       Lungs:    diffuse expiratory wheezes, occasional rhonchi, no rales,  respirations unlabored  Heart:    Regular rate and rhythm  Neurologic:   Awake, alert, oriented x 3. No apparent focal neurological           defect.            Assessment & Plan:     1. Asthma, chronic, moderate persistent, uncomplicated  - predniSONE (DELTASONE) 10 MG tablet; 6 tablets for 2 days, then 5 for 2 days, then 4 for 2 days, then 3 for 2 days, then 2 for 2 days, then 1 for 2 days.  Dispense: 42 tablet; Refill: 0 - montelukast (SINGULAIR) 10 MG tablet; Take 1 tablet (10 mg total) by mouth at bedtime.  Dispense: 30 tablet; Refill: 5  2. Sinusitis, unspecified chronicity, unspecified location  - azithromycin (ZITHROMAX) 250 MG tablet; 2 by mouth today, then 1 daily for 4 days  Dispense: 6 tablet; Refill: 0  3. Allergic rhinitis due to pollen, unspecified seasonality  - predniSONE (DELTASONE) 10 MG tablet; 6 tablets for 2 days, then 5 for 2 days, then 4 for 2 days, then 3 for 2 days,  then 2 for 2 days, then 1 for 2 days.  Dispense: 42 tablet; Refill: 0  4. Bronchitis  - azithromycin (ZITHROMAX) 250 MG tablet; 2 by mouth today, then 1 daily for 4 days  Dispense: 6 tablet; Refill: 0  5. Bilateral low back pain with bilateral sciatica, unspecified chronicity  - HYDROcodone-acetaminophen (NORCO/VICODIN) 5-325 MG tablet; Take 1 tablet by mouth every 6 (six) hours as needed for severe pain. Patient request brand  Dispense: 30 tablet; Refill: 0  6. DDD (degenerative disc disease), lumbar  - HYDROcodone-acetaminophen (NORCO/VICODIN) 5-325 MG tablet; Take 1 tablet by mouth every 6 (six) hours as needed for severe pain. Patient request brand  Dispense: 30 tablet; Refill: 0  7. Nausea  - promethazine (PHENERGAN) 25 MG tablet; Take 1 tablet (25 mg total) by mouth every 8 (eight) hours as needed. Reported on 12/28/2015  Dispense: 20 tablet; Refill: 0  Call if symptoms change or if not rapidly improving.           Lelon Huh, MD  [facility]

## 2017-10-13 ENCOUNTER — Telehealth: Payer: Self-pay | Admitting: Family Medicine

## 2017-10-13 NOTE — Telephone Encounter (Signed)
Received FMLA forms from [facility] [employer]/ [employer]. Placed form in providers box. Thanks CC

## 2017-10-16 NOTE — Telephone Encounter (Signed)
Patient stated she was out of 4/4, 4/5 and 4/8.

## 2017-10-17 ENCOUNTER — Telehealth: Payer: Self-pay | Admitting: Family Medicine

## 2017-10-17 NOTE — Telephone Encounter (Signed)
Forms were given to Dr. Caryn Section.

## 2017-10-17 NOTE — Telephone Encounter (Signed)
Received FMLA form. Placed form in providers box. Thanks CC

## 2017-10-30 NOTE — Telephone Encounter (Signed)
They were completed a week or two ago. I don't know where they are now.

## 2017-10-30 NOTE — Telephone Encounter (Signed)
Please advise forms? 

## 2017-10-30 NOTE — Telephone Encounter (Signed)
Pt requesting call after 3 pm.  She is wanting to know the status of her forms.  States they are past due.

## 2017-11-01 NOTE — Telephone Encounter (Signed)
Forms were faxed 10/25/2017. Left message on pt's vm notifying her forms were faxed and also original copy is at front desk for pick-up.

## 2017-11-22 ENCOUNTER — Ambulatory Visit: Payer: BC Managed Care – PPO | Admitting: Family Medicine

## 2017-11-22 ENCOUNTER — Encounter: Payer: Self-pay | Admitting: Family Medicine

## 2017-11-22 VITALS — BP 110/70 | HR 91 | Temp 98.2°F | Resp 16 | Wt 267.0 lb

## 2017-11-22 DIAGNOSIS — M5136 Other intervertebral disc degeneration, lumbar region: Secondary | ICD-10-CM

## 2017-11-22 DIAGNOSIS — M5410 Radiculopathy, site unspecified: Secondary | ICD-10-CM | POA: Diagnosis not present

## 2017-11-22 DIAGNOSIS — M25551 Pain in right hip: Secondary | ICD-10-CM | POA: Diagnosis not present

## 2017-11-22 MED ORDER — OXYCODONE-ACETAMINOPHEN 5-325 MG PO TABS: 1.0000 | ORAL_TABLET | Freq: Four times a day (QID) | ORAL | 0 refills | Status: DC | PRN

## 2017-11-22 MED ORDER — KETOROLAC TROMETHAMINE 60 MG/2ML IM SOLN
60.0000 mg | Freq: Once | INTRAMUSCULAR | Status: AC
  Administered 2017-11-22: 60 mg via INTRAMUSCULAR

## 2017-11-22 NOTE — Progress Notes (Signed)
t      Patient: Erin Sherman Female    DOB: 1966-01-17   52 y.o.   MRN: [id number] Visit Date: 11/22/2017  Today's Provider: Lelon Huh, MD   Chief Complaint  Patient presents with  . Fall   Subjective:    HPI  Patient states she fell in the bathtub last night due to her right hip giving out. Patient was last seen for back and right hip pain 10/09/2017. Patient states she wants to discuss being referred to a specialist. She states she has been having persistent pain in right hip and neck for years. Feels weaker on her right side. States she seen surgeon several years and told she needed disk surgery, but she has been putting it off. She doesn't recall who or where she was seen and I don't see any records of of MRI in her chart. She has had several xrays of spine showing facet joint osteoarthrosis and degenerative disc disease. She requests referral to orthopedic surgery for evaluation.    Allergies  Allergen Reactions  . Latex     swelling  . Sulfa Antibiotics     Hives, itching  . Meperidine Rash     Current Outpatient Medications:  .  albuterol (PROVENTIL HFA) 108 (90 Base) MCG/ACT inhaler, Inhale 2 puffs into the lungs every 6 (six) hours as needed., Disp: 18 g, Rfl: 6 .  ALPRAZolam (XANAX) 0.5 MG tablet, Take 1 tablet (0.5 mg total) by mouth every 4 (four) hours as needed. (Patient taking differently: Take 0.5 mg by mouth 2 (two) times daily as needed. ), Disp: 30 tablet, Rfl: 0 .  budesonide-formoterol (SYMBICORT) 160-4.5 MCG/ACT inhaler, Inhale 2 puffs 2 (two) times daily into the lungs., Disp: 1 Inhaler, Rfl: 12 .  calcium carbonate (TUMS) 500 MG chewable tablet, Chew 1 tablet by mouth daily., Disp: , Rfl:  .  cetirizine-pseudoephedrine (ZYRTEC-D) 5-120 MG tablet, Take 1 tablet by mouth daily. As needed for allergies, Disp: 30 tablet, Rfl: 6 .  cholecalciferol (VITAMIN D) 1000 UNITS tablet, Take 1,000 Units by mouth daily., Disp: , Rfl:  .  EPINEPHrine (EPIPEN  2-PAK) 0.3 mg/0.3 mL IJ SOAJ injection, Inject 0.3 mLs (0.3 mg total) into the muscle as needed. With allergic reaction, Disp: 1 Device, Rfl: 3 .  HYDROcodone-acetaminophen (NORCO/VICODIN) 5-325 MG tablet, Take 1 tablet by mouth every 6 (six) hours as needed for severe pain. Patient request brand, Disp: 30 tablet, Rfl: 0 .  ipratropium-albuterol (DUONEB) 0.5-2.5 (3) MG/3ML SOLN, Inhale 3 mLs into the lungs every 6 (six) hours as needed. Reported on 12/28/2015, Disp: 360 mL, Rfl: 3 .  mometasone (NASONEX) 50 MCG/ACT nasal spray, Place 2 sprays into the nose daily., Disp: 17 g, Rfl: 2 .  montelukast (SINGULAIR) 10 MG tablet, Take 1 tablet (10 mg total) by mouth at bedtime., Disp: 30 tablet, Rfl: 5 .  Multiple Vitamins-Minerals (MULTIVITAMIN & MINERAL PO), Take 1 tablet by mouth daily. Reported on 11/17/2015, Disp: , Rfl:  .  Omega-3 1000 MG CAPS, Take 1 capsule by mouth. , Disp: , Rfl:  .  promethazine (PHENERGAN) 25 MG tablet, Take 1 tablet (25 mg total) by mouth every 8 (eight) hours as needed. Reported on 12/28/2015, Disp: 20 tablet, Rfl: 0 .  Spacer/Aero-Holding Chambers ([place]) MISC, 1 each by Other route once. Always uses her when you're using a metered-dose inhaler. You've aromatase medicine as much, he won't have his much side effect, but you it twice as much medicine and your lungs.,  Disp: 1 each, Rfl: 0 .  triamcinolone cream (KENALOG) 0.5 %, Apply 1 application 3 (three) times daily topically., Disp: 45 g, Rfl: 0  Current Facility-Administered Medications:  .  methylPREDNISolone acetate (DEPO-MEDROL) injection 80 mg, 80 mg, Intramuscular, Once, Jacion Dismore, Kirstie Peri, MD  Review of Systems  Constitutional: Negative for appetite change, chills, fatigue and fever.  Respiratory: Negative for chest tightness and shortness of breath.   Cardiovascular: Negative for chest pain and palpitations.  Gastrointestinal: Negative for abdominal pain, nausea and vomiting.  Musculoskeletal: Positive  for back pain.  Neurological: Negative for dizziness and weakness.    Social History   Tobacco Use  . Smoking status: Former Smoker    Packs/day: 0.30    Years: 25.00    Pack years: 7.50    Types: Cigarettes    Last attempt to quit: 07/16/2008    Years since quitting: 9.3  . Smokeless tobacco: Never Used  Substance Use Topics  . Alcohol use: Yes    Comment: occasional beer.   Objective:   BP 110/70 (BP Location: Right Arm, Patient Position: Sitting, Cuff Size: Large)   Pulse 91   Temp 98.2 F (36.8 C) (Oral)   Resp 16   Wt 267 lb (121.1 kg)   SpO2 98%   BMI 52.14 kg/m  Vitals:   11/22/17 1626  BP: 110/70  Pulse: 91  Resp: 16  Temp: 98.2 F (36.8 C)  TempSrc: Oral  SpO2: 98%  Weight: 267 lb (121.1 kg)     Physical Exam   General Appearance:    Alert, cooperative, no distress  Eyes:    PERRL, conjunctiva/corneas clear, EOM's intact       Lungs:     Clear to auscultation bilaterally, respirations unlabored  Heart:    Regular rate and rhythm  MS:   Diffuse tenderness along cervical and lumbar spine. Tender over greater trochanter. +5/5 UE and LE muscle strength. Normal s/s ues and les.           Assessment & Plan:     1. Back pain with radiculopathy She states she was previously advised she may need disk surgery. She requests refill to orthopedics, but does not recall who she saw in the past.  - Ambulatory referral to Orthopedic Surgery - ketorolac (TORADOL) injection 60 mg  2. Right hip pain She states steroid shot of trochanter only helped a couple days ago and just wants something to ease up hip, back and neck pain.  - Ambulatory referral to Orthopedic Surgery - ketorolac (TORADOL) injection 60 mg  3. DDD (degenerative disc disease), lumbar        Lelon Huh, MD  [facility]

## 2017-12-18 ENCOUNTER — Emergency Department
Admission: EM | Admit: 2017-12-18 | Discharge: 2017-12-18 | Disposition: A | Discharge status: Home / Self Care | Payer: BC Managed Care – PPO | Attending: Emergency Medicine | Admitting: Emergency Medicine

## 2017-12-18 ENCOUNTER — Other Ambulatory Visit: Payer: Self-pay

## 2017-12-18 ENCOUNTER — Emergency Department: Payer: BC Managed Care – PPO

## 2017-12-18 ENCOUNTER — Encounter: Payer: Self-pay | Admitting: Intensive Care

## 2017-12-18 DIAGNOSIS — Z87891 Personal history of nicotine dependence: Secondary | ICD-10-CM | POA: Insufficient documentation

## 2017-12-18 DIAGNOSIS — R0789 Other chest pain: Secondary | ICD-10-CM

## 2017-12-18 DIAGNOSIS — Z79899 Other long term (current) drug therapy: Secondary | ICD-10-CM | POA: Insufficient documentation

## 2017-12-18 DIAGNOSIS — J45909 Unspecified asthma, uncomplicated: Secondary | ICD-10-CM | POA: Diagnosis not present

## 2017-12-18 HISTORY — DX: Cerebral infarction, unspecified: I63.9

## 2017-12-18 HISTORY — DX: Acute myocardial infarction, unspecified: I21.9

## 2017-12-18 LAB — TROPONIN I: Troponin I: <0.03 ng/mL (ref <0.03)

## 2017-12-18 LAB — BASIC METABOLIC PANEL
ANION GAP: 9 (ref 5–15)
BUN: 15 mg/dL (ref 6–20)
CO2: 24 mmol/L (ref 22–32)
Calcium: 8.9 mg/dL (ref 8.9–10.3)
Chloride: 102 mmol/L (ref 101–111)
Creatinine, Ser: 0.69 mg/dL (ref 0.44–1.00)
Glucose, Bld: 96 mg/dL (ref 65–99)
POTASSIUM: 3.9 mmol/L (ref 3.5–5.1)
SODIUM: 135 mmol/L (ref 135–145)

## 2017-12-18 LAB — CBC
HEMATOCRIT: 34.6 % — AB (ref 35.0–47.0)
Hemoglobin: 11.4 g/dL — ABNORMAL LOW (ref 12.0–16.0)
MCH: 27.9 pg (ref 26.0–34.0)
MCHC: 33 g/dL (ref 32.0–36.0)
MCV: 84.6 fL (ref 80.0–100.0)
Platelets: 308 10*3/uL (ref 150–440)
RBC: 4.1 MIL/uL (ref 3.80–5.20)
RDW: 13 % (ref 11.5–14.5)
WBC: 8.8 10*3/uL (ref 3.6–11.0)

## 2017-12-18 MED ORDER — LORAZEPAM 1 MG PO TABS
ORAL_TABLET | ORAL | Status: AC
  Administered 2017-12-18: 1 mg via ORAL
  Filled 2017-12-18: qty 1

## 2017-12-18 MED ORDER — LORAZEPAM 1 MG PO TABS
1.0000 mg | ORAL_TABLET | Freq: Once | ORAL | Status: AC
Start: 2017-12-18 — End: 2017-12-18
  Administered 2017-12-18: 1 mg via ORAL

## 2017-12-18 NOTE — ED Triage Notes (Signed)
Patient c/o tingling/stabbing chest discomfort since last night with weakness all over. Also reports tingling around lips. A&O x4. No trouble ambulating

## 2017-12-18 NOTE — ED Provider Notes (Signed)
[facility] Emergency Department Provider Note  ____________________________________________   First MD Initiated Contact with Patient 12/18/17 1918     (approximate)  I have reviewed the triage vital signs and the nursing notes.   HISTORY  Chief Complaint Chest Pain    HPI Erin Sherman is a 52 y.o. female self presents to the emergency department with chest pain that began last night.  The pain is sharp and stabbing in her left chest.  She also feels generally "weak".  Her pain is intermittent lasting seconds to minutes at a time.  Nonradiating.  No diaphoresis.  No nausea.  Mild shortness of breath.  Nonexertional.  No history of DVT or pulmonary embolism.  No recent surgery travel or immobilization.  No leg swelling.  She does have a history of morbid obesity as well as hypertension and stroke.  Her symptoms are currently resolved.  She has had an increased amount of anxiety and stress at work and at home and she is concerned this could be the etiology.  Nothing in particular seems to make her symptoms come on or go away.  Past Medical History:  Diagnosis Date  . Cervical cancer ([place])   . Hx of bronchitis   . MI (myocardial infarction) ([place])   . Migraines   . Stomach ulcer   . Stroke [facility])     Patient Active Problem List   Diagnosis Date Noted  . Hyperplastic polyp of sigmoid colon 11/29/2016  . Polyp of sigmoid colon   . Itching 11/17/2015  . Trochanteric bursitis of right hip 07/01/2015  . Low back pain 07/01/2015  . DDD (degenerative disc disease), lumbar 05/15/2015  . Carpal tunnel syndrome 05/07/2015  . Hip pain 05/07/2015  . History of cervical cancer 05/07/2015  . HLD (hyperlipidemia) 05/07/2015  . Menopausal symptom 05/07/2015  . Obesity 05/07/2015  . 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 Urinary incontinence 05/07/2015  . Nausea 05/07/2015  . Asthma, chronic 07/16/2013  . Allergic rhinitis 07/16/2013  . GERD (gastroesophageal reflux  disease) 07/16/2013    Past Surgical History:  Procedure Laterality Date  . ABDOMINAL HYSTERECTOMY     with unilateral oophorectomy and part of cervix removed due to cervical cancer  . CARPAL TUNNEL RELEASE Right 2013  . CARPAL TUNNEL RELEASE  03/02/2010   Endoscopic carpal tunnel release. Rica Mast, MD (Orthopedic, [facility])  . CESAREAN SECTION    . COLONOSCOPY WITH PROPOFOL N/A 11/29/2016   Procedure: COLONOSCOPY WITH PROPOFOL;  Surgeon: [name], MD;  Location: [facility] ENDOSCOPY;  Service: Endoscopy;  Laterality: N/A;  . exercise treadmill Test  05/31/2006   Normal  . TUBAL LIGATION      Prior to Admission medications   Medication Sig Start Date End Date Taking? Authorizing Provider  albuterol (PROVENTIL HFA) 108 (90 Base) MCG/ACT inhaler Inhale 2 puffs into the lungs every 6 (six) hours as needed. 07/21/17   Birdie Sons, MD  ALPRAZolam Duanne Moron) 0.5 MG tablet Take 1 tablet (0.5 mg total) by mouth every 4 (four) hours as needed. Patient taking differently: Take 0.5 mg by mouth 2 (two) times daily as needed.  08/04/17   Birdie Sons, MD  budesonide-formoterol [facility]) 160-4.5 MCG/ACT inhaler Inhale 2 puffs 2 (two) times daily into the lungs. 05/10/17   Birdie Sons, MD  calcium carbonate (TUMS) 500 MG chewable tablet Chew 1 tablet by mouth daily.    [provider]  cetirizine-pseudoephedrine (ZYRTEC-D) 5-120 MG tablet Take 1 tablet by mouth daily.  As needed for allergies 07/21/17   Birdie Sons, MD  cholecalciferol (VITAMIN D) 1000 UNITS tablet Take 1,000 Units by mouth daily.    [provider]  EPINEPHrine (EPIPEN 2-PAK) 0.3 mg/0.3 mL IJ SOAJ injection Inject 0.3 mLs (0.3 mg total) into the muscle as needed. With allergic reaction 09/29/16   Mar Daring, PA-C  HYDROcodone-acetaminophen (NORCO/VICODIN) 5-325 MG tablet Take 1 tablet by mouth every 6 (six) hours as needed for severe pain. Patient request brand 10/09/17   Birdie Sons, MD    ipratropium-albuterol (DUONEB) 0.5-2.5 (3) MG/3ML SOLN Inhale 3 mLs into the lungs every 6 (six) hours as needed. Reported on 12/28/2015 12/28/15   Mar Daring, PA-C  mometasone (NASONEX) 50 MCG/ACT nasal spray Place 2 sprays into the nose daily. 07/21/17 07/21/18  Birdie Sons, MD  montelukast (SINGULAIR) 10 MG tablet Take 1 tablet (10 mg total) by mouth at bedtime. 10/09/17   Birdie Sons, MD  Multiple Vitamins-Minerals (MULTIVITAMIN & MINERAL PO) Take 1 tablet by mouth daily. Reported on 11/17/2015    [provider]  Omega-3 1000 MG CAPS Take 1 capsule by mouth.     [provider]  oxyCODONE-acetaminophen (PERCOCET/ROXICET) 5-325 MG tablet Take 1 tablet by mouth every 6 (six) hours as needed for severe pain. 11/22/17   Birdie Sons, MD  promethazine (PHENERGAN) 25 MG tablet Take 1 tablet (25 mg total) by mouth every 8 (eight) hours as needed. Reported on 12/28/2015 10/09/17   Birdie Sons, MD  Spacer/Aero-Holding Chambers ([place]) [place] 1 each by Other route once. Always uses her when you're using a metered-dose inhaler. You've aromatase medicine as much, he won't have his much side effect, but you it twice as much medicine and your lungs. 05/24/15   Ahmed Prima, MD  triamcinolone cream (KENALOG) 0.5 % Apply 1 application 3 (three) times daily topically. 05/10/17   Birdie Sons, MD    Allergies Latex; Sulfa antibiotics; and Meperidine  Family History  Problem Relation Age of Onset  . Emphysema Mother   . Asthma Mother   . Diabetes Mother        type 2  . COPD Mother   . Hypertension Mother   . Emphysema Father   . Hypertension Father   . COPD Father   . Colon cancer Father        died of colon cancer August 16, 2017  . Asthma Sister   . Asthma Brother   . Heart attack Brother   . Heart attack Maternal Grandmother   . Heart attack Paternal Grandmother   . Anemia Sister   . Asthma Son   . Cancer Maternal Grandfather         prostate  . Cancer Maternal Aunt        breast  . Cancer Other     Social History Social History   Tobacco Use  . Smoking status: Former Smoker    Packs/day: 0.30    Years: 25.00    Pack years: 7.50    Types: Cigarettes    Last attempt to quit: 07/16/2008    Years since quitting: 9.4  . Smokeless tobacco: Never Used  Substance Use Topics  . Alcohol use: Yes    Comment: occasional beer.  . Drug use: No    Review of Systems Constitutional: No fever/chills Eyes: No visual changes. ENT: No sore throat. Cardiovascular: Positive for chest pain. Respiratory: Positive for shortness of breath. Gastrointestinal: No abdominal pain.  No nausea, no vomiting.  No diarrhea.  No constipation. Genitourinary: Negative for dysuria. Musculoskeletal: Negative for back pain. Skin: Negative for rash. Neurological: Negative for headaches, focal weakness or numbness.   ____________________________________________   PHYSICAL EXAM:  VITAL SIGNS: ED Triage Vitals  Enc Vitals Group     BP 12/18/17 1727 127/72     Pulse Rate 12/18/17 1727 94     Resp 12/18/17 1727 16     Temp 12/18/17 1727 98.7 F (37.1 C)     Temp Source 12/18/17 1727 Oral     SpO2 12/18/17 1727 97 %     Weight 12/18/17 1729 262 lb (118.8 kg)     Height 12/18/17 1729 5' (1.524 m)     Head Circumference --      Peak Flow --      Pain Score 12/18/17 1727 8     Pain Loc --      Pain Edu? --      Excl. in [place]? --     Constitutional: Alert and oriented x4 pleasant cooperative speaks in full clear sentences no diaphoresis Eyes: PERRL EOMI. Head: Atraumatic. Nose: No congestion/rhinnorhea. Mouth/Throat: No trismus Neck: No stridor.   Cardiovascular: Normal rate, regular rhythm. Grossly normal heart sounds.  Good peripheral circulation. Respiratory: Normal respiratory effort.  No retractions. Lungs CTAB and moving good air Gastrointestinal: Obese soft nontender Musculoskeletal: No lower extremity edema   Neurologic:   Normal speech and language. No gross focal neurologic deficits are appreciated. Skin:  Skin is warm, dry and intact. No rash noted. Psychiatric: Mood and affect are normal. Speech and behavior are normal.    ____________________________________________   DIFFERENTIAL includes but not limited to  Pulmonary embolism, acute coronary syndrome, pneumonia, pneumothorax, aortic dissection ____________________________________________   LABS (all labs ordered are listed, but only abnormal results are displayed)  Labs Reviewed  CBC - Abnormal; Notable for the following components:      Result Value   Hemoglobin 11.4 (*)    HCT 34.6 (*)    All other components within normal limits  BASIC METABOLIC PANEL  TROPONIN I  TROPONIN I    Lab work reviewed by me with no signs of acute ischemia x2 __________________________________________  EKG  ED ECG REPORT I, Darel Hong, the attending physician, personally viewed and interpreted this ECG.  Date: 12/18/2017 EKG Time:  Rate: 90 Rhythm: normal sinus rhythm QRS Axis: normal Intervals: normal ST/T Wave abnormalities: normal Narrative Interpretation: no evidence of acute ischemia  ____________________________________________  RADIOLOGY  Chest x-ray reviewed by me with no acute disease noted ____________________________________________   PROCEDURES  Procedure(s) performed: no  Procedures  Critical Care performed: no  Observation: no ____________________________________________   INITIAL IMPRESSION / ASSESSMENT AND PLAN / ED COURSE  Pertinent labs & imaging results that were available during my care of the patient were reviewed by me and considered in my medical decision making (see chart for details).  The patient is overall quite well-appearing with atypical chest pain.  2 troponins obtained secondary to her multiple comorbidities and fortunately negative.  Doubt pulmonary embolism at this time.  I had a lengthy  discussion with patient regarding the diagnostic uncertainty however she would prefer to go home and be managed as an outpatient which I think is reasonable.  She is discharged home in improved condition verbalizes understanding and agreement with plan.      ____________________________________________   FINAL CLINICAL IMPRESSION(S) / ED DIAGNOSES  Final diagnoses:  Atypical chest pain  NEW MEDICATIONS STARTED DURING THIS VISIT:  Discharge Medication List as of 12/18/2017  8:25 PM       Note:  This document was prepared using Dragon voice recognition software and may include unintentional dictation errors.     Darel Hong, MD 12/19/17 1024

## 2017-12-18 NOTE — Discharge Instructions (Signed)
Fortunately today your chest x-ray, your blood work, and your EKG were reassuring.  Please follow-up with your primary care physician within 1 week for reevaluation return to the emergency department sooner for any concerns.  It was a pleasure to take care of you today, and thank you for coming to our emergency department.  If you have any questions or concerns before leaving please ask the nurse to grab me and I'm more than happy to go through your aftercare instructions again.  If you were prescribed any opioid pain medication today such as Norco, Vicodin, Percocet, morphine, hydrocodone, or oxycodone please make sure you do not drive when you are taking this medication as it can alter your ability to drive safely.  If you have any concerns once you are home that you are not improving or are in fact getting worse before you can make it to your follow-up appointment, please do not hesitate to call 911 and come back for further evaluation.  Darel Hong, MD  Results for orders placed or performed during the hospital encounter of 95/63/87  Basic metabolic panel  Result Value Ref Range   Sodium 135 135 - 145 mmol/L   Potassium 3.9 3.5 - 5.1 mmol/L   Chloride 102 101 - 111 mmol/L   CO2 24 22 - 32 mmol/L   Glucose, Bld 96 65 - 99 mg/dL   BUN 15 6 - 20 mg/dL   Creatinine, Ser 0.69 0.44 - 1.00 mg/dL   Calcium 8.9 8.9 - 10.3 mg/dL   GFR calc non Af Amer >60 >60 mL/min   GFR calc Af Amer >60 >60 mL/min   Anion gap 9 5 - 15  CBC  Result Value Ref Range   WBC 8.8 3.6 - 11.0 K/uL   RBC 4.10 3.80 - 5.20 MIL/uL   Hemoglobin 11.4 (L) 12.0 - 16.0 g/dL   HCT 34.6 (L) 35.0 - 47.0 %   MCV 84.6 80.0 - 100.0 fL   MCH 27.9 26.0 - 34.0 pg   MCHC 33.0 32.0 - 36.0 g/dL   RDW 13.0 11.5 - 14.5 %   Platelets 308 150 - 440 K/uL  Troponin I  Result Value Ref Range   Troponin I <0.03 <0.03 ng/mL  Troponin I  Result Value Ref Range   Troponin I <0.03 <0.03 ng/mL   Dg Chest 2 View  Result Date:  12/18/2017 CLINICAL DATA:  Tingling around lips. EXAM: CHEST - 2 VIEW COMPARISON:  12/22/2013. FINDINGS: The heart size and mediastinal contours are within normal limits. Both lungs are clear. The visualized skeletal structures are unremarkable. IMPRESSION: No active cardiopulmonary disease.  Stable chest. Electronically Signed   By: Staci Righter M.D.   On: 12/18/2017 18:02

## 2017-12-18 NOTE — ED Notes (Signed)
Pt able to ambulate to treatment room without difficulty. No SOB or increased WOB noted. NAD at this time.

## 2017-12-27 ENCOUNTER — Other Ambulatory Visit (HOSPITAL_COMMUNITY): Payer: Self-pay | Admitting: Orthopedic Surgery

## 2017-12-27 ENCOUNTER — Other Ambulatory Visit: Payer: Self-pay | Admitting: Orthopedic Surgery

## 2017-12-27 DIAGNOSIS — M5416 Radiculopathy, lumbar region: Secondary | ICD-10-CM

## 2017-12-27 DIAGNOSIS — G959 Disease of spinal cord, unspecified: Secondary | ICD-10-CM

## 2018-01-11 ENCOUNTER — Ambulatory Visit
Admission: RE | Admit: 2018-01-11 | Discharge: 2018-01-11 | Disposition: A | Discharge status: Home / Self Care | Payer: BC Managed Care – PPO | Source: Ambulatory Visit | Attending: Orthopedic Surgery | Admitting: Orthopedic Surgery

## 2018-01-11 DIAGNOSIS — M545 Low back pain: Secondary | ICD-10-CM | POA: Diagnosis present

## 2018-01-11 DIAGNOSIS — M5416 Radiculopathy, lumbar region: Secondary | ICD-10-CM

## 2018-01-11 DIAGNOSIS — M5136 Other intervertebral disc degeneration, lumbar region: Secondary | ICD-10-CM | POA: Insufficient documentation

## 2018-01-11 DIAGNOSIS — M542 Cervicalgia: Secondary | ICD-10-CM | POA: Insufficient documentation

## 2018-01-11 DIAGNOSIS — M4686 Other specified inflammatory spondylopathies, lumbar region: Secondary | ICD-10-CM | POA: Insufficient documentation

## 2018-01-11 DIAGNOSIS — R600 Localized edema: Secondary | ICD-10-CM | POA: Insufficient documentation

## 2018-01-11 DIAGNOSIS — G959 Disease of spinal cord, unspecified: Secondary | ICD-10-CM

## 2018-01-12 ENCOUNTER — Ambulatory Visit: Payer: BC Managed Care – PPO | Admitting: Physician Assistant

## 2018-01-16 ENCOUNTER — Telehealth: Payer: Self-pay | Admitting: Family Medicine

## 2018-01-16 NOTE — Telephone Encounter (Signed)
Pt called wanting to know the first steps to go through to get a service dog  Pt's call back Is [phone]  Thanks teri

## 2018-01-16 NOTE — Telephone Encounter (Signed)
Please advise 

## 2018-01-18 NOTE — Telephone Encounter (Signed)
Pt called back for an update on the steps she needs to take on trying to get a service dog for her anxiety and panic attacks. Please advise. Thanks TNP

## 2018-01-18 NOTE — Telephone Encounter (Signed)
Please advise 

## 2018-01-19 NOTE — Telephone Encounter (Signed)
I have no idea how to get a service dog. All we do is writea letter stating medical need for a service dog if it is needed at place of residence (e.g. If dogs are not allowed at an apartment you live in)

## 2018-01-19 NOTE — Telephone Encounter (Signed)
Patient was advised expressed understanding.

## 2018-02-15 ENCOUNTER — Other Ambulatory Visit: Payer: Self-pay | Admitting: Orthopedic Surgery

## 2018-02-15 DIAGNOSIS — G959 Disease of spinal cord, unspecified: Secondary | ICD-10-CM

## 2018-02-28 ENCOUNTER — Ambulatory Visit
Admission: RE | Admit: 2018-02-28 | Discharge: 2018-02-28 | Disposition: A | Discharge status: Home / Self Care | Payer: BC Managed Care – PPO | Source: Ambulatory Visit | Attending: Orthopedic Surgery | Admitting: Orthopedic Surgery

## 2018-02-28 DIAGNOSIS — G935 Compression of brain: Secondary | ICD-10-CM | POA: Insufficient documentation

## 2018-02-28 DIAGNOSIS — M4712 Other spondylosis with myelopathy, cervical region: Secondary | ICD-10-CM | POA: Diagnosis not present

## 2018-02-28 DIAGNOSIS — M542 Cervicalgia: Secondary | ICD-10-CM | POA: Diagnosis present

## 2018-02-28 DIAGNOSIS — G959 Disease of spinal cord, unspecified: Secondary | ICD-10-CM

## 2018-02-28 DIAGNOSIS — M4802 Spinal stenosis, cervical region: Secondary | ICD-10-CM | POA: Diagnosis not present

## 2018-04-11 ENCOUNTER — Encounter: Payer: Self-pay | Admitting: Family Medicine

## 2018-04-11 ENCOUNTER — Ambulatory Visit: Payer: BC Managed Care – PPO | Admitting: Family Medicine

## 2018-04-11 VITALS — BP 106/62 | HR 98 | Temp 98.3°F | Wt 265.0 lb

## 2018-04-11 DIAGNOSIS — J4540 Moderate persistent asthma, uncomplicated: Secondary | ICD-10-CM

## 2018-04-11 DIAGNOSIS — M5136 Other intervertebral disc degeneration, lumbar region: Secondary | ICD-10-CM

## 2018-04-11 DIAGNOSIS — E669 Obesity, unspecified: Secondary | ICD-10-CM

## 2018-04-11 DIAGNOSIS — M5410 Radiculopathy, site unspecified: Secondary | ICD-10-CM | POA: Diagnosis not present

## 2018-04-11 DIAGNOSIS — J301 Allergic rhinitis due to pollen: Secondary | ICD-10-CM

## 2018-04-11 MED ORDER — DICLOFENAC EPOLAMINE 1.3 % TD PTCH
1.0000 | MEDICATED_PATCH | Freq: Two times a day (BID) | TRANSDERMAL | 5 refills | Status: DC | PRN
Start: 2018-04-11 — End: 2020-02-28

## 2018-04-11 MED ORDER — PHENTERMINE HCL 37.5 MG PO CAPS: 37.5000 mg | ORAL_CAPSULE | ORAL | 3 refills | Status: DC

## 2018-04-11 MED ORDER — BUDESONIDE-FORMOTEROL FUMARATE 160-4.5 MCG/ACT IN AERO: 2.0000 | INHALATION_SPRAY | Freq: Two times a day (BID) | RESPIRATORY_TRACT | 12 refills | Status: DC

## 2018-04-11 MED ORDER — OXYCODONE-ACETAMINOPHEN 7.5-325 MG PO TABS: 1.0000 | ORAL_TABLET | Freq: Four times a day (QID) | ORAL | 0 refills | Status: DC | PRN

## 2018-04-11 MED ORDER — ALBUTEROL SULFATE HFA 108 (90 BASE) MCG/ACT IN AERS: 2.0000 | INHALATION_SPRAY | Freq: Four times a day (QID) | RESPIRATORY_TRACT | 6 refills | Status: DC | PRN

## 2018-04-11 NOTE — Progress Notes (Signed)
Patient: Erin Sherman Female    DOB: 1965-11-30   51 y.o.   MRN: [id number] Visit Date: 04/11/2018  Today's Provider: Lelon Huh, MD   Chief Complaint  Patient presents with  . Back Pain    Chronic issue  . URI    Started about two days ago.   Subjective:    URI   This is a new problem. The current episode started in the past 7 days. The problem has been gradually worsening. There has been no fever. Associated symptoms include congestion, coughing, sneezing, a sore throat, swollen glands and wheezing. Pertinent negatives include no ear pain, headaches, neck pain, rhinorrhea or sinus pain.  Back Pain  This is a chronic problem. The problem has been gradually worsening since onset. The pain is present in the lumbar spine. Associated symptoms include leg pain. Pertinent negatives include no bladder incontinence, bowel incontinence, fever, headaches, numbness (Right legs), paresis, paresthesias, tingling or weakness.  She states she has been prescribed cyclobenzaprine and methocarbamol with very little relief. She takes occasional oxycodone/apap 5/325. She has tried OTC topical pain relievers which helped very little, but would like to try topical prescription patch. OTC ibuprofen, and tylenol provide very little relief.   She also requests refill for phentermine which she has taken in past to help lose weight. States she tolerated well wihtou side effects.    Wt Readings from Last 3 Encounters:  04/11/18 265 lb (120.2 kg)  12/18/17 262 lb (118.8 kg)  11/22/17 267 lb (121.1 kg)       Allergies  Allergen Reactions  . Latex     swelling  . Sulfa Antibiotics     Hives, itching  . Meperidine Rash     Current Outpatient Medications:  .  albuterol (PROVENTIL HFA) 108 (90 Base) MCG/ACT inhaler, Inhale 2 puffs into the lungs every 6 (six) hours as needed., Disp: 18 g, Rfl: 6 .  ALPRAZolam (XANAX) 0.5 MG tablet, Take 1 tablet (0.5 mg total) by mouth every 4 (four)  hours as needed. (Patient taking differently: Take 0.5 mg by mouth 2 (two) times daily as needed. ), Disp: 30 tablet, Rfl: 0 .  budesonide-formoterol (SYMBICORT) 160-4.5 MCG/ACT inhaler, Inhale 2 puffs 2 (two) times daily into the lungs., Disp: 1 Inhaler, Rfl: 12 .  calcium carbonate (TUMS) 500 MG chewable tablet, Chew 1 tablet by mouth daily., Disp: , Rfl:  .  cetirizine-pseudoephedrine (ZYRTEC-D) 5-120 MG tablet, Take 1 tablet by mouth daily. As needed for allergies, Disp: 30 tablet, Rfl: 6 .  cholecalciferol (VITAMIN D) 1000 UNITS tablet, Take 1,000 Units by mouth daily., Disp: , Rfl:  .  EPINEPHrine (EPIPEN 2-PAK) 0.3 mg/0.3 mL IJ SOAJ injection, Inject 0.3 mLs (0.3 mg total) into the muscle as needed. With allergic reaction, Disp: 1 Device, Rfl: 3 .  HYDROcodone-acetaminophen (NORCO/VICODIN) 5-325 MG tablet, Take 1 tablet by mouth every 6 (six) hours as needed for severe pain. Patient request brand, Disp: 30 tablet, Rfl: 0 .  ipratropium-albuterol (DUONEB) 0.5-2.5 (3) MG/3ML SOLN, Inhale 3 mLs into the lungs every 6 (six) hours as needed. Reported on 12/28/2015, Disp: 360 mL, Rfl: 3 .  mometasone (NASONEX) 50 MCG/ACT nasal spray, Place 2 sprays into the nose daily., Disp: 17 g, Rfl: 2 .  montelukast (SINGULAIR) 10 MG tablet, Take 1 tablet (10 mg total) by mouth at bedtime., Disp: 30 tablet, Rfl: 5 .  Multiple Vitamins-Minerals (MULTIVITAMIN & MINERAL PO), Take 1 tablet by mouth daily. Reported  on 11/17/2015, Disp: , Rfl:  .  Omega-3 1000 MG CAPS, Take 1 capsule by mouth. , Disp: , Rfl:  .  oxyCODONE-acetaminophen (PERCOCET/ROXICET) 5-325 MG tablet, Take 1 tablet by mouth every 6 (six) hours as needed for severe pain., Disp: 30 tablet, Rfl: 0 .  promethazine (PHENERGAN) 25 MG tablet, Take 1 tablet (25 mg total) by mouth every 8 (eight) hours as needed. Reported on 12/28/2015, Disp: 20 tablet, Rfl: 0 .  Spacer/Aero-Holding Chambers ([place]) MISC, 1 each by Other route once. Always uses  her when you're using a metered-dose inhaler. You've aromatase medicine as much, he won't have his much side effect, but you it twice as much medicine and your lungs., Disp: 1 each, Rfl: 0 .  triamcinolone cream (KENALOG) 0.5 %, Apply 1 application 3 (three) times daily topically., Disp: 45 g, Rfl: 0  Current Facility-Administered Medications:  .  methylPREDNISolone acetate (DEPO-MEDROL) injection 80 mg, 80 mg, Intramuscular, Once, Nyles Mitton, Kirstie Peri, MD  Review of Systems  Constitutional: Positive for fatigue. Negative for activity change, appetite change, chills, diaphoresis, fever and unexpected weight change.  HENT: Positive for congestion, sneezing and sore throat. Negative for ear discharge, ear pain, postnasal drip, rhinorrhea, sinus pressure, sinus pain, tinnitus, trouble swallowing and voice change.   Eyes: Positive for itching. Negative for photophobia, pain, discharge, redness and visual disturbance.  Respiratory: Positive for cough, chest tightness and wheezing. Negative for apnea, choking, shortness of breath and stridor.   Gastrointestinal: Negative.  Negative for bowel incontinence.  Genitourinary: Negative for bladder incontinence.  Musculoskeletal: Positive for arthralgias, back pain and myalgias. Negative for gait problem, joint swelling, neck pain and neck stiffness.  Neurological: Negative for dizziness, tingling, weakness, light-headedness, numbness (Right legs), headaches and paresthesias.  Hematological: Positive for adenopathy.    Social History   Tobacco Use  . Smoking status: Former Smoker    Packs/day: 0.30    Years: 25.00    Pack years: 7.50    Types: Cigarettes    Last attempt to quit: 07/16/2008    Years since quitting: 9.7  . Smokeless tobacco: Never Used  Substance Use Topics  . Alcohol use: Yes    Comment: occasional beer.   Objective:   BP 106/62 (BP Location: Right Arm, Patient Position: Sitting, Cuff Size: Large)   Pulse 98   Temp 98.3 F (36.8 C)  (Oral)   Wt 265 lb (120.2 kg)   SpO2 98%   BMI 51.75 kg/m    Physical Exam  General Appearance:    Alert, cooperative, no distress, obese  Eyes:    PERRL, conjunctiva/corneas clear, EOM's intact       Lungs:     Clear to auscultation bilaterally, respirations unlabored  Heart:    Regular rate and rhythm  Neurologic:   Awake, alert, oriented x 3. No apparent focal neurological           defect.   MS:  Tender bilateral lumbar spine.        Assessment & Plan:      1. Obesity Counseled on risk of increase BP and associated cardiovascular complication. Advised not to take on days she takes any other stimulant such as sudafed.  - phentermine 37.5 MG capsule; Take 1 capsule (37.5 mg total) by mouth every morning. DO NOT TAKE THIS MEDICATION WHEN TAKING ZYRTEC-D  Dispense: 30 capsule; Refill: 3  2. Back pain with radiculopathy Takes occasional oxycodone/apap. 5/325 with mild improvement, will change to- oxyCODONE-acetaminophen (PERCOCET) 7.5-325 MG tablet; Take  1 tablet by mouth every 6 (six) hours as needed for severe pain.  Dispense: 30 tablet; Refill: 0 Failed oral NSAIDs, try - diclofenac (FLECTOR) 1.3 % PTCH; Place 1 patch onto the skin 2 (two) times daily as needed.  Dispense: 60 patch; Refill: 5  3. DDD (degenerative disc disease), lumbar  - oxyCODONE-acetaminophen (PERCOCET) 7.5-325 MG tablet; Take 1 tablet by mouth every 6 (six) hours as needed for severe pain.  Dispense: 30 tablet; Refill: 0  4. Allergic rhinitis due to pollen, unspecified seasonality Continue current antihistamines.   5. Asthma, chronic, moderate persistent, uncomplicated Using albuterol a few times a week. Refill - albuterol (PROVENTIL HFA) 108 (90 Base) MCG/ACT inhaler; Inhale 2 puffs into the lungs every 6 (six) hours as needed.  Dispense: 18 g; Refill: 6 - budesonide-formoterol (SYMBICORT) 160-4.5 MCG/ACT inhaler; Inhale 2 puffs into the lungs 2 (two) times daily.  Dispense: 1 Inhaler; Refill: 12        Lelon Huh, MD  [facility]

## 2018-04-26 ENCOUNTER — Telehealth: Payer: Self-pay | Admitting: Family Medicine

## 2018-04-26 NOTE — Telephone Encounter (Signed)
Pt called for a 2nd time to check on status of FMLA paperwork.  Thanks, [employer]

## 2018-04-26 NOTE — Telephone Encounter (Signed)
FMLA has been completed. Please fax.

## 2018-04-26 NOTE — Telephone Encounter (Signed)
Forms faxed. Patient advised.

## 2018-04-26 NOTE — Telephone Encounter (Signed)
Pt faxed FMLA paperwork on Oct. 5th.  Pt called back checking on the status.  She states the [employer] paperwork has to be turned in on Oct. 25th. Pt is faxing another copy today.  Please call pt back asap.  Thanks, [employer]

## 2018-05-15 ENCOUNTER — Telehealth: Payer: Self-pay

## 2018-05-15 NOTE — Telephone Encounter (Signed)
Patient called office to inquire about her FMLA form that she states was faxed over a day ago. Patient would like for CMA to give her a call back on her cell when available. KW

## 2018-05-16 NOTE — Telephone Encounter (Signed)
Left message with Mahala Menghini ([place] Disability claim Specialist) to call me back so I can get further information on what they are needing for the FMLA papers.  Thanks,   -Mickel Baas

## 2018-05-18 NOTE — Telephone Encounter (Signed)
Spoke with Ria Comment, she states Ms. Blow has been approved for FMLA and no further action needs to be taken at this time.   Thanks,   -Mickel Baas

## 2018-06-25 ENCOUNTER — Telehealth: Payer: Self-pay | Admitting: Family Medicine

## 2018-06-25 ENCOUNTER — Ambulatory Visit: Payer: Self-pay | Admitting: Family Medicine

## 2018-06-25 NOTE — Telephone Encounter (Signed)
Please Review

## 2018-06-25 NOTE — Telephone Encounter (Signed)
Please schedule with a PA

## 2018-06-25 NOTE — Telephone Encounter (Signed)
Appointment scheduled today at 3:40pm with Mikki Santee. Tried calling patient at all numbers listed in her chart and received no answer. Left voice message for patient to call back.

## 2018-06-25 NOTE — Telephone Encounter (Signed)
Patient has been drinking after children who have been diagnosed with strep.  Her throat is now sore and she is coughing a lot with phlegm. Pt. Thinks she has strep also.   She wants to be seen this afternoon at 3:30, however everyone is booked.   Please advise.

## 2018-08-02 ENCOUNTER — Encounter: Payer: Self-pay | Admitting: Family Medicine

## 2018-08-02 ENCOUNTER — Ambulatory Visit: Payer: BC Managed Care – PPO | Admitting: Family Medicine

## 2018-08-02 VITALS — BP 120/70 | HR 96 | Temp 98.1°F | Resp 18 | Wt 269.0 lb

## 2018-08-02 DIAGNOSIS — J45909 Unspecified asthma, uncomplicated: Secondary | ICD-10-CM

## 2018-08-02 DIAGNOSIS — M5410 Radiculopathy, site unspecified: Secondary | ICD-10-CM

## 2018-08-02 DIAGNOSIS — J40 Bronchitis, not specified as acute or chronic: Secondary | ICD-10-CM

## 2018-08-02 DIAGNOSIS — M51369 Other intervertebral disc degeneration, lumbar region without mention of lumbar back pain or lower extremity pain: Secondary | ICD-10-CM

## 2018-08-02 DIAGNOSIS — M5136 Other intervertebral disc degeneration, lumbar region: Secondary | ICD-10-CM

## 2018-08-02 DIAGNOSIS — H66001 Acute suppurative otitis media without spontaneous rupture of ear drum, right ear: Secondary | ICD-10-CM | POA: Diagnosis not present

## 2018-08-02 MED ORDER — PREDNISONE 10 MG PO TABS: ORAL_TABLET | ORAL | 0 refills | Status: AC

## 2018-08-02 MED ORDER — AZITHROMYCIN 250 MG PO TABS: ORAL_TABLET | ORAL | 0 refills | Status: AC

## 2018-08-02 MED ORDER — OXYCODONE-ACETAMINOPHEN 7.5-325 MG PO TABS: 1.0000 | ORAL_TABLET | Freq: Four times a day (QID) | ORAL | 0 refills | Status: DC | PRN

## 2018-08-02 NOTE — Progress Notes (Addendum)
Patient: Erin Sherman Female    DOB: 08/24/1965   53 y.o.   MRN: [id number] Visit Date: 08/02/2018  Today's Provider: Lelon Huh, MD   Chief Complaint  Patient presents with  . Cough    x 1 day   Subjective:     Cough  This is a new problem. The current episode started yesterday. The problem has been gradually worsening. The cough is productive of sputum (clear/ white colored mucus). Associated symptoms include chest pain, myalgias, a sore throat, shortness of breath, sweats and wheezing. Pertinent negatives include no chills, ear congestion, ear pain, fever, headaches, hemoptysis, nasal congestion or rhinorrhea. She has tried ipratropium inhaler for the symptoms. The treatment provided no relief. Her past medical history is significant for asthma.    Allergies  Allergen Reactions  . Latex     swelling  . Sulfa Antibiotics     Hives, itching  . Meperidine Rash     Current Outpatient Medications:  .  albuterol (PROVENTIL HFA) 108 (90 Base) MCG/ACT inhaler, Inhale 2 puffs into the lungs every 6 (six) hours as needed., Disp: 18 g, Rfl: 6 .  ALPRAZolam (XANAX) 0.5 MG tablet, Take 1 tablet (0.5 mg total) by mouth every 4 (four) hours as needed. (Patient taking differently: Take 0.5 mg by mouth 2 (two) times daily as needed. ), Disp: 30 tablet, Rfl: 0 .  budesonide-formoterol (SYMBICORT) 160-4.5 MCG/ACT inhaler, Inhale 2 puffs into the lungs 2 (two) times daily., Disp: 1 Inhaler, Rfl: 12 .  calcium carbonate (TUMS) 500 MG chewable tablet, Chew 1 tablet by mouth daily., Disp: , Rfl:  .  cetirizine-pseudoephedrine (ZYRTEC-D) 5-120 MG tablet, Take 1 tablet by mouth daily. As needed for allergies, Disp: 30 tablet, Rfl: 6 .  diclofenac (FLECTOR) 1.3 % PTCH, Place 1 patch onto the skin 2 (two) times daily as needed., Disp: 60 patch, Rfl: 5 .  EPINEPHrine (EPIPEN 2-PAK) 0.3 mg/0.3 mL IJ SOAJ injection, Inject 0.3 mLs (0.3 mg total) into the muscle as needed. With allergic  reaction, Disp: 1 Device, Rfl: 3 .  ipratropium-albuterol (DUONEB) 0.5-2.5 (3) MG/3ML SOLN, Inhale 3 mLs into the lungs every 6 (six) hours as needed. Reported on 12/28/2015, Disp: 360 mL, Rfl: 3 .  montelukast (SINGULAIR) 10 MG tablet, Take 1 tablet (10 mg total) by mouth at bedtime., Disp: 30 tablet, Rfl: 5 .  Multiple Vitamins-Minerals (MULTIVITAMIN & MINERAL PO), Take 1 tablet by mouth daily. Reported on 11/17/2015, Disp: , Rfl:  .  oxyCODONE-acetaminophen (PERCOCET) 7.5-325 MG tablet, Take 1 tablet by mouth every 6 (six) hours as needed for severe pain., Disp: 30 tablet, Rfl: 0 .  promethazine (PHENERGAN) 25 MG tablet, Take 1 tablet (25 mg total) by mouth every 8 (eight) hours as needed. Reported on 12/28/2015, Disp: 20 tablet, Rfl: 0 .  Spacer/Aero-Holding Chambers ([place]) MISC, 1 each by Other route once. Always uses her when you're using a metered-dose inhaler. You've aromatase medicine as much, he won't have his much side effect, but you it twice as much medicine and your lungs., Disp: 1 each, Rfl: 0 .  triamcinolone cream (KENALOG) 0.5 %, Apply 1 application 3 (three) times daily topically., Disp: 45 g, Rfl: 0 .  cholecalciferol (VITAMIN D) 1000 UNITS tablet, Take 1,000 Units by mouth daily., Disp: , Rfl:  .  mometasone (NASONEX) 50 MCG/ACT nasal spray, Place 2 sprays into the nose daily., Disp: 17 g, Rfl: 2 .  Omega-3 1000 MG CAPS, Take 1  capsule by mouth. , Disp: , Rfl:  .  phentermine 37.5 MG capsule, Take 1 capsule (37.5 mg total) by mouth every morning. DO NOT TAKE THIS MEDICATION WHEN TAKING ZYRTEC-D (Patient not taking: Reported on 08/02/2018), Disp: 30 capsule, Rfl: 3  Review of Systems  Constitutional: Positive for diaphoresis and fatigue. Negative for appetite change, chills and fever.  HENT: Positive for congestion, sinus pressure, sinus pain and sore throat. Negative for ear pain and rhinorrhea.   Eyes: Positive for discharge and itching.  Respiratory: Positive for  cough, shortness of breath and wheezing. Negative for hemoptysis and chest tightness.   Cardiovascular: Positive for chest pain. Negative for palpitations.  Gastrointestinal: Negative for abdominal pain, nausea and vomiting.  Musculoskeletal: Positive for myalgias.  Neurological: Negative for dizziness, weakness and headaches.    Social History   Tobacco Use  . Smoking status: Former Smoker    Packs/day: 0.30    Years: 25.00    Pack years: 7.50    Types: Cigarettes    Last attempt to quit: 07/16/2008    Years since quitting: 10.0  . Smokeless tobacco: Never Used  Substance Use Topics  . Alcohol use: Yes    Comment: occasional beer.      Objective:   BP 120/70 (BP Location: Left Arm, Patient Position: Sitting, Cuff Size: Large)   Pulse 96   Temp 98.1 F (36.7 C) (Oral)   Resp 18   Wt 269 lb (122 kg)   SpO2 97% Comment: room air  BMI 52.54 kg/m  Vitals:   08/02/18 1057  BP: 120/70  Pulse: 96  Resp: 18  Temp: 98.1 F (36.7 C)  TempSrc: Oral  SpO2: 97%  Weight: 269 lb (122 kg)     Physical Exam  General Appearance:    Alert, cooperative, no distress  HENT:   bilateral  LeftTM normal without fluid or infection, Right TM red and bulging, neck without nodes and nasal mucosa pale and congested  Eyes:    PERRL, conjunctiva/corneas clear, EOM's intact       Lungs:     Occasional expiratory wheeze, no rales, , respirations unlabored  Heart:    Regular rate and rhythm  Neurologic:   Awake, alert, oriented x 3. No apparent focal neurological           defect.           Assessment & Plan    1. Bronchitis  - azithromycin (ZITHROMAX) 250 MG tablet; 2 by mouth today, then 1 daily for 4 days  Dispense: 6 tablet; Refill: 0 - predniSONE (DELTASONE) 10 MG tablet; 6 tablets for 2 days, then 5 for 2 days, then 4 for 2 days, then 3 for 2 days, then 2 for 2 days, then 1 for 2 days.  Dispense: 42 tablet; Refill: 0  2. Chronic asthma without complication, unspecified asthma  severity, unspecified whether persistent  Work excuse from tomorrow through 08-04-2018 and RTW Monday 08-06-2018  3. Back pain with radiculopathy Due for refill - oxyCODONE-acetaminophen (PERCOCET) 7.5-325 MG tablet; Take 1 tablet by mouth every 6 (six) hours as needed for severe pain.  Dispense: 30 tablet; Refill: 0  4. DDD (degenerative disc disease), lumbar  - oxyCODONE-acetaminophen (PERCOCET) 7.5-325 MG tablet; Take 1 tablet by mouth every 6 (six) hours as needed for severe pain.  Dispense: 30 tablet; Refill: 0     Lelon Huh, MD  [facility]

## 2018-08-02 NOTE — Patient Instructions (Signed)
.   Please review the attached list of medications and notify my office if there are any errors.   . Please bring all of your medications to every appointment so we can make sure that our medication list is the same as yours.    You can take OTC Mucinex (guaifenesin) for chest or sinus congestion and to help your cough

## 2018-08-03 ENCOUNTER — Telehealth: Payer: Self-pay

## 2018-08-03 DIAGNOSIS — R110 Nausea: Secondary | ICD-10-CM

## 2018-08-03 MED ORDER — PROMETHAZINE HCL 25 MG PO TABS: 25.0000 mg | ORAL_TABLET | Freq: Three times a day (TID) | ORAL | 0 refills | Status: DC | PRN

## 2018-08-03 NOTE — Telephone Encounter (Signed)
Patient called office stating that she was seen by Dr. Caryn Section yesterday and was prescribed antibiotic. Patient states that medicine is making her very nauseas . Patient is requesting a prescription for phenergan sent to [facility]

## 2018-09-22 ENCOUNTER — Other Ambulatory Visit: Payer: Self-pay | Admitting: Family Medicine

## 2018-09-25 ENCOUNTER — Ambulatory Visit: Payer: BC Managed Care – PPO | Admitting: Family Medicine

## 2018-09-25 ENCOUNTER — Other Ambulatory Visit: Payer: Self-pay

## 2018-09-25 ENCOUNTER — Telehealth: Payer: Self-pay

## 2018-09-25 ENCOUNTER — Encounter: Payer: Self-pay | Admitting: Family Medicine

## 2018-09-25 VITALS — BP 126/83 | HR 91 | Temp 98.4°F | Wt 272.0 lb

## 2018-09-25 DIAGNOSIS — M1711 Unilateral primary osteoarthritis, right knee: Secondary | ICD-10-CM | POA: Diagnosis not present

## 2018-09-25 DIAGNOSIS — J301 Allergic rhinitis due to pollen: Secondary | ICD-10-CM

## 2018-09-25 DIAGNOSIS — M5136 Other intervertebral disc degeneration, lumbar region: Secondary | ICD-10-CM | POA: Diagnosis not present

## 2018-09-25 DIAGNOSIS — M222X1 Patellofemoral disorders, right knee: Secondary | ICD-10-CM

## 2018-09-25 DIAGNOSIS — M51369 Other intervertebral disc degeneration, lumbar region without mention of lumbar back pain or lower extremity pain: Secondary | ICD-10-CM

## 2018-09-25 DIAGNOSIS — M5410 Radiculopathy, site unspecified: Secondary | ICD-10-CM

## 2018-09-25 MED ORDER — CYCLOBENZAPRINE HCL 5 MG PO TABS: 5.0000 mg | ORAL_TABLET | Freq: Every day | ORAL | 2 refills | Status: DC

## 2018-09-25 MED ORDER — METHOCARBAMOL 500 MG PO TABS: 500.0000 mg | ORAL_TABLET | Freq: Two times a day (BID) | ORAL | 3 refills | Status: DC | PRN

## 2018-09-25 MED ORDER — ZYRTEC-D ALLERGY & CONGESTION 5-120 MG PO TB12: ORAL_TABLET | ORAL | 8 refills | Status: DC

## 2018-09-25 MED ORDER — OXYCODONE-ACETAMINOPHEN 7.5-325 MG PO TABS: 1.0000 | ORAL_TABLET | Freq: Four times a day (QID) | ORAL | 0 refills | Status: DC | PRN

## 2018-09-25 MED ORDER — DICLOFENAC SODIUM 1 % TD GEL: 4.0000 g | Freq: Four times a day (QID) | TRANSDERMAL | 1 refills | Status: DC

## 2018-09-25 NOTE — Telephone Encounter (Signed)
Patient called and stated that [facility] told her  PA needs to be done in order for her to get the Voltaren 1% gel. I advised patient that the pharmacy has to send [place] a prior authorization request.

## 2018-09-25 NOTE — Progress Notes (Addendum)
Patient: Erin Sherman Female    DOB: Jun 05, 1966   53 y.o.   MRN: [id number] Visit Date: 09/25/2018  Today's Provider: Lelon Huh, MD   Chief Complaint  Patient presents with  . Leg Pain    Pain and swelling started yesterday   Subjective:     Leg Pain   Incident onset: Started yesterday. There was no injury mechanism. The pain is present in the right leg and right knee. Associated symptoms include an inability to bear weight, a loss of motion and tingling. Pertinent negatives include no loss of sensation, muscle weakness or numbness. She reports no foreign bodies present. The symptoms are aggravated by weight bearing and movement. The treatment provided no relief.  Has had pain in knee off and on for months, but has been much worse lately. Has been taking OTC Aleve and ibuprofen with minimal improvement.   She also has chronic back pain and take muscle relaxers prescribed by orthopedist. She is out and requests refill.     Allergies  Allergen Reactions  . Latex     swelling  . Sulfa Antibiotics     Hives, itching  . Meperidine Rash     Current Outpatient Medications:  .  albuterol (PROVENTIL HFA) 108 (90 Base) MCG/ACT inhaler, Inhale 2 puffs into the lungs every 6 (six) hours as needed., Disp: 18 g, Rfl: 6 .  budesonide-formoterol (SYMBICORT) 160-4.5 MCG/ACT inhaler, Inhale 2 puffs into the lungs 2 (two) times daily., Disp: 1 Inhaler, Rfl: 12 .  calcium carbonate (TUMS) 500 MG chewable tablet, Chew 1 tablet by mouth daily., Disp: , Rfl:  .  diclofenac (FLECTOR) 1.3 % PTCH, Place 1 patch onto the skin 2 (two) times daily as needed., Disp: 60 patch, Rfl: 5 .  ELDERBERRY PO, Take by mouth., Disp: , Rfl:  .  EPINEPHrine (EPIPEN 2-PAK) 0.3 mg/0.3 mL IJ SOAJ injection, Inject 0.3 mLs (0.3 mg total) into the muscle as needed. With allergic reaction, Disp: 1 Device, Rfl: 3 .  ipratropium-albuterol (DUONEB) 0.5-2.5 (3) MG/3ML SOLN, Inhale 3 mLs into the lungs every 6  (six) hours as needed. Reported on 12/28/2015, Disp: 360 mL, Rfl: 3 .  montelukast (SINGULAIR) 10 MG tablet, Take 1 tablet (10 mg total) by mouth at bedtime., Disp: 30 tablet, Rfl: 5 .  oxyCODONE-acetaminophen (PERCOCET) 7.5-325 MG tablet, Take 1 tablet by mouth every 6 (six) hours as needed for severe pain., Disp: 30 tablet, Rfl: 0 .  promethazine (PHENERGAN) 25 MG tablet, Take 1 tablet (25 mg total) by mouth every 8 (eight) hours as needed for nausea., Disp: 20 tablet, Rfl: 0 .  Spacer/Aero-Holding Chambers ([employer]) MISC, 1 each by Other route once. Always uses her when you're using a metered-dose inhaler. You've aromatase medicine as much, he won't have his much side effect, but you it twice as much medicine and your lungs., Disp: 1 each, Rfl: 0 .  triamcinolone cream (KENALOG) 0.5 %, Apply 1 application 3 (three) times daily topically., Disp: 45 g, Rfl: 0 .  ZYRTEC-D ALLERGY & CONGESTION 5-120 MG tablet, TAKE 1 TABLET BY MOUTH DAILY. AS NEEDED FOR ALLERGIES, Disp: 24 tablet, Rfl: 8 .  ALPRAZolam (XANAX) 0.5 MG tablet, Take 1 tablet (0.5 mg total) by mouth every 4 (four) hours as needed. (Patient not taking: Reported on 09/25/2018), Disp: 30 tablet, Rfl: 0 .  cholecalciferol (VITAMIN D) 1000 UNITS tablet, Take 1,000 Units by mouth daily., Disp: , Rfl:  .  mometasone (NASONEX) 50  MCG/ACT nasal spray, Place 2 sprays into the nose daily., Disp: 17 g, Rfl: 2 .  Multiple Vitamins-Minerals (MULTIVITAMIN & MINERAL PO), Take 1 tablet by mouth daily. Reported on 11/17/2015, Disp: , Rfl:  .  Omega-3 1000 MG CAPS, Take 1 capsule by mouth. , Disp: , Rfl:  .  phentermine 37.5 MG capsule, Take 1 capsule (37.5 mg total) by mouth every morning. DO NOT TAKE THIS MEDICATION WHEN TAKING ZYRTEC-D (Patient not taking: Reported on 08/02/2018), Disp: 30 capsule, Rfl: 3  .  cyclobenzaprine (FLEXERIL) 5 MG tablet, Take 5-10 mg by mouth at bedtime., Disp: , Rfl:  .  methocarbamol (ROBAXIN) 500 MG tablet, Take 500  mg by mouth 2 (two) times daily as needed for muscle spasms., Disp: , Rfl:    Review of Systems  Constitutional: Negative.   Respiratory: Negative.   Musculoskeletal: Positive for arthralgias, back pain, gait problem, joint swelling and myalgias. Negative for neck pain and neck stiffness.  Neurological: Positive for tingling. Negative for numbness.    Social History   Tobacco Use  . Smoking status: Former Smoker    Packs/day: 0.30    Years: 25.00    Pack years: 7.50    Types: Cigarettes    Last attempt to quit: 07/16/2008    Years since quitting: 10.2  . Smokeless tobacco: Never Used  Substance Use Topics  . Alcohol use: Yes    Comment: occasional beer.      Objective:   BP 126/83 (BP Location: Right Arm, Patient Position: Sitting, Cuff Size: Large)   Pulse 91   Temp 98.4 F (36.9 C) (Oral)   Wt 272 lb (123.4 kg)   BMI 53.12 kg/m  Vitals:   09/25/18 1054  BP: 126/83  Pulse: 91  Temp: 98.4 F (36.9 C)  TempSrc: Oral  Weight: 272 lb (123.4 kg)     Physical Exam  General Appearance:    Alert, cooperative, no distress, morbidly obese  Eyes:    PERRL, conjunctiva/corneas clear, EOM's intact       Lungs:     Clear to auscultation bilaterally, respirations unlabored  Heart:    Regular rate and rhythm  MS:   Tender around right knee joint line and patellar-femoral joint. Mild swelling, no erythema.           Assessment & Plan    1. Patellofemoral pain syndrome of right knee try- diclofenac sodium (VOLTAREN) 1 % GEL; Apply 4 g topically 4 (four) times daily.  Dispense: 100 g; Refill: 1  2. Primary osteoarthritis of right knee  - diclofenac sodium (VOLTAREN) 1 % GEL; Apply 4 g topically 4 (four) times daily.  Dispense: 100 g; Refill: 1  3. DDD (degenerative disc disease), lumbar refill- oxyCODONE-acetaminophen (PERCOCET) 7.5-325 MG tablet; Take 1 tablet by mouth every 6 (six) hours as needed for severe pain.  Dispense: 30 tablet; Refill: 0 - cyclobenzaprine  (FLEXERIL) 5 MG tablet; Take 1-2 tablets (5-10 mg total) by mouth at bedtime.  Dispense: 60 tablet; Refill: 2 - methocarbamol (ROBAXIN) 500 MG tablet; Take 1 tablet (500 mg total) by mouth 2 (two) times daily as needed for muscle spasms.  Dispense: 60 tablet; Refill: 3  4. Back pain with radiculopathy refill- oxyCODONE-acetaminophen (PERCOCET) 7.5-325 MG tablet; Take 1 tablet by mouth every 6 (six) hours as needed for severe pain.  Dispense: 30 tablet; Refill: 0  5. Allergic rhinitis due to pollen, unspecified seasonality She states Zyrtec D is working well and requests refill sent to her pharmacy.  -  ZYRTEC-D ALLERGY & CONGESTION 5-120 MG tablet; TAKE 1 TABLET BY MOUTH DAILY. AS NEEDED FOR ALLERGIES  Dispense: 24 tablet; Refill: [place], MD  [facility]

## 2018-09-25 NOTE — Telephone Encounter (Signed)
Patient called saying that she would rather have Dr. Caryn Section call in something different that would not need a PA. She is wanting to start something today. Please advise. Thanks!

## 2018-09-25 NOTE — Telephone Encounter (Signed)
Prior Erin Sherman has been done and medication has been approved.

## 2018-09-25 NOTE — Patient Instructions (Signed)
.   Please review the attached list of medications and notify my office if there are any errors.   . Please bring all of your medications to every appointment so we can make sure that our medication list is the same as yours.   

## 2018-09-25 NOTE — Telephone Encounter (Signed)
Pt advised.   Thanks,   -Laura  

## 2018-11-19 ENCOUNTER — Ambulatory Visit (INDEPENDENT_AMBULATORY_CARE_PROVIDER_SITE_OTHER): Payer: BC Managed Care – PPO | Admitting: Family Medicine

## 2018-11-19 ENCOUNTER — Other Ambulatory Visit: Payer: Self-pay

## 2018-11-19 ENCOUNTER — Ambulatory Visit: Payer: Self-pay | Admitting: Family Medicine

## 2018-11-19 DIAGNOSIS — M5136 Other intervertebral disc degeneration, lumbar region: Secondary | ICD-10-CM

## 2018-11-19 DIAGNOSIS — M5410 Radiculopathy, site unspecified: Secondary | ICD-10-CM | POA: Diagnosis not present

## 2018-11-19 DIAGNOSIS — J0110 Acute frontal sinusitis, unspecified: Secondary | ICD-10-CM | POA: Diagnosis not present

## 2018-11-19 DIAGNOSIS — L282 Other prurigo: Secondary | ICD-10-CM

## 2018-11-19 DIAGNOSIS — J45901 Unspecified asthma with (acute) exacerbation: Secondary | ICD-10-CM | POA: Diagnosis not present

## 2018-11-19 MED ORDER — TRIAMCINOLONE ACETONIDE 0.5 % EX CREA: 1.0000 "application " | TOPICAL_CREAM | Freq: Three times a day (TID) | CUTANEOUS | 0 refills | Status: DC

## 2018-11-19 MED ORDER — AMOXICILLIN 500 MG PO CAPS: 1000.0000 mg | ORAL_CAPSULE | Freq: Two times a day (BID) | ORAL | 0 refills | Status: DC

## 2018-11-19 MED ORDER — OXYCODONE-ACETAMINOPHEN 7.5-325 MG PO TABS: 1.0000 | ORAL_TABLET | Freq: Four times a day (QID) | ORAL | 0 refills | Status: DC | PRN

## 2018-11-19 MED ORDER — CYCLOBENZAPRINE HCL 5 MG PO TABS: 5.0000 mg | ORAL_TABLET | Freq: Every day | ORAL | 2 refills | Status: DC

## 2018-11-19 MED ORDER — PREDNISONE 10 MG PO TABS: ORAL_TABLET | ORAL | 0 refills | Status: DC

## 2018-11-19 NOTE — Progress Notes (Signed)
Patient: Erin Sherman Female    DOB: [birth year]   53 y.o.   MRN: [id number] Visit Date: 11/19/2018  Today's Provider: Lelon Huh, MD   No chief complaint on file.  Virtual Visit via Video Note  I connected with Erin Sherman on 11/19/18 at 10:40 AM EDT by a video enabled telemedicine application and verified that I am speaking with the correct person using two identifiers.   I discussed the limitations of evaluation and management by telemedicine and the availability of in person appointments. The patient expressed understanding and agreed to proceed.   Subjective:     HPI Patient reports two day history of pain and pressure across nasal passages and frontal sinuses associated with non-productive cough and dyspnea typical of her past asthma flares. Has been using inhalers consistently and nebulizer which help significantly. Denies fevers, chills, sweats, myalgia, abdominal pain, change in taste or smell. She did have some left over prednisone from previous flare which she took yesterday and helped with breathing.   Allergies  Allergen Reactions  . Latex     swelling  . Sulfa Antibiotics     Hives, itching  . Meperidine Rash     Current Outpatient Medications:  .  albuterol (PROVENTIL HFA) 108 (90 Base) MCG/ACT inhaler, Inhale 2 puffs into the lungs every 6 (six) hours as needed., Disp: 18 g, Rfl: 6 .  budesonide-formoterol (SYMBICORT) 160-4.5 MCG/ACT inhaler, Inhale 2 puffs into the lungs 2 (two) times daily., Disp: 1 Inhaler, Rfl: 12 .  calcium carbonate (TUMS) 500 MG chewable tablet, Chew 1 tablet by mouth daily., Disp: , Rfl:  .  cholecalciferol (VITAMIN D) 1000 UNITS tablet, Take 1,000 Units by mouth daily., Disp: , Rfl:  .  cyclobenzaprine (FLEXERIL) 5 MG tablet, Take 1-2 tablets (5-10 mg total) by mouth at bedtime., Disp: 60 tablet, Rfl: 2 .  diclofenac (FLECTOR) 1.3 % PTCH, Place 1 patch onto the skin 2 (two) times daily as needed., Disp: 60 patch,  Rfl: 5 .  diclofenac sodium (VOLTAREN) 1 % GEL, Apply 4 g topically 4 (four) times daily., Disp: 100 g, Rfl: 1 .  ELDERBERRY PO, Take by mouth., Disp: , Rfl:  .  EPINEPHrine (EPIPEN 2-PAK) 0.3 mg/0.3 mL IJ SOAJ injection, Inject 0.3 mLs (0.3 mg total) into the muscle as needed. With allergic reaction, Disp: 1 Device, Rfl: 3 .  ipratropium-albuterol (DUONEB) 0.5-2.5 (3) MG/3ML SOLN, Inhale 3 mLs into the lungs every 6 (six) hours as needed. Reported on 12/28/2015, Disp: 360 mL, Rfl: 3 .  methocarbamol (ROBAXIN) 500 MG tablet, Take 1 tablet (500 mg total) by mouth 2 (two) times daily as needed for muscle spasms., Disp: 60 tablet, Rfl: 3 .  mometasone (NASONEX) 50 MCG/ACT nasal spray, Place 2 sprays into the nose daily., Disp: 17 g, Rfl: 2 .  montelukast (SINGULAIR) 10 MG tablet, Take 1 tablet (10 mg total) by mouth at bedtime., Disp: 30 tablet, Rfl: 5 .  oxyCODONE-acetaminophen (PERCOCET) 7.5-325 MG tablet, Take 1 tablet by mouth every 6 (six) hours as needed for severe pain., Disp: 30 tablet, Rfl: 0 .  promethazine (PHENERGAN) 25 MG tablet, Take 1 tablet (25 mg total) by mouth every 8 (eight) hours as needed for nausea., Disp: 20 tablet, Rfl: 0 .  Spacer/Aero-Holding Chambers (OPTICHAMBER ADVANTAGE) MISC, 1 each by Other route once. Always uses her when you're using a metered-dose inhaler. You've aromatase medicine as much, he won't have his much side effect, but you it  twice as much medicine and your lungs., Disp: 1 each, Rfl: 0 .  triamcinolone cream (KENALOG) 0.5 %, Apply 1 application 3 (three) times daily topically., Disp: 45 g, Rfl: 0 .  ZYRTEC-D ALLERGY & CONGESTION 5-120 MG tablet, TAKE 1 TABLET BY MOUTH DAILY. AS NEEDED FOR ALLERGIES, Disp: 24 tablet, Rfl: 8  Review of Systems  Constitutional: Negative for appetite change, chills, fatigue and fever.  Respiratory: Negative for chest tightness and shortness of breath.   Cardiovascular: Negative for chest pain and palpitations.   Gastrointestinal: Negative for abdominal pain, nausea and vomiting.  Neurological: Negative for dizziness and weakness.    Social History   Tobacco Use  . Smoking status: Former Smoker    Packs/day: 0.30    Years: 25.00    Pack years: 7.50    Types: Cigarettes    Last attempt to quit: 07/16/2008    Years since quitting: 10.3  . Smokeless tobacco: Never Used  Substance Use Topics  . Alcohol use: Yes    Comment: occasional beer.      Objective:   There were no vitals taken for this visit.    Physical Exam  General appearance: alert, well developed, well nourished, cooperative and in no distress Head: Normocephalic, without obvious abnormality, atraumatic Respiratory: Respirations even and unlabored, normal respiratory rate Psych: Appropriate mood and affect. Neurologic: Mental status: Alert, oriented to person, place, and time, thought content appropriate.     Assessment & Plan    1. Mild asthma with exacerbation, unspecified whether persistent Typical for past episodes for patient - amoxicillin (AMOXIL) 500 MG capsule; Take 2 capsules (1,000 mg total) by mouth 2 (two) times daily for 10 days.  Dispense: 40 capsule; Refill: 0 - predniSONE (DELTASONE) 10 MG tablet; 6 tablets for 2 days, then 5 for 2 days, then 4 for 2 days, then 3 for 2 days, then 2 for 2 days, then 1 for 2 days.  Dispense: 42 tablet; Refill: 0  2. Acute non-recurrent frontal sinusitis  - amoxicillin (AMOXIL) 500 MG capsule; Take 2 capsules (1,000 mg total) by mouth 2 (two) times daily for 10 days.  Dispense: 40 capsule; Refill: 0  3. Back pain with radiculopathy refill- oxyCODONE-acetaminophen (PERCOCET) 7.5-325 MG tablet; Take 1 tablet by mouth every 6 (six) hours as needed for severe pain.  Dispense: 30 tablet; Refill: 0  4. DDD (degenerative disc disease), lumbar - oxyCODONE-acetaminophen (PERCOCET) 7.5-325 MG tablet; Take 1 tablet by mouth every 6 (six) hours as needed for severe pain.  Dispense:  30 tablet; Refill: 0 - cyclobenzaprine (FLEXERIL) 5 MG tablet; Take 1-2 tablets (5-10 mg total) by mouth at bedtime.  Dispense: 60 tablet; Refill: 2  5. Papular urticaria refill- triamcinolone cream (KENALOG) 0.5 %; Apply 1 application topically 3 (three) times daily.  Dispense: 45 g; Refill: 0       I discussed the assessment and treatment plan with the patient. The patient was provided an opportunity to ask questions and all were answered. The patient agreed with the plan and demonstrated an understanding of the instructions.   The patient was advised to call back or seek an in-person evaluation if the symptoms worsen or if the condition fails to improve as anticipated.  I provided 15 minutes of non-face-to-face time during this encounter.  Lelon Huh, MD  [facility]

## 2018-11-19 NOTE — Patient Instructions (Signed)
.   Please review the attached list of medications and notify my office if there are any errors.   . Please bring all of your medications to every appointment so we can make sure that our medication list is the same as yours.   

## 2018-11-23 ENCOUNTER — Telehealth: Payer: Self-pay

## 2018-11-23 DIAGNOSIS — J301 Allergic rhinitis due to pollen: Secondary | ICD-10-CM

## 2018-11-23 MED ORDER — NEOMYCIN-POLYMYXIN-HC 3.5-10000-1 OT SOLN: 3.0000 [drp] | Freq: Four times a day (QID) | OTIC | 0 refills | Status: AC

## 2018-11-23 MED ORDER — ZYRTEC-D ALLERGY & CONGESTION 5-120 MG PO TB12: ORAL_TABLET | ORAL | 4 refills | Status: DC

## 2018-11-23 NOTE — Telephone Encounter (Signed)
Patient called requesting possible ear drops for her right ear. She reports that she has a lot of fluid and thinks it could be infected. She has pain when opening and closing her jaw and has a popping sensation in her right ear. She also mentions that she has PND that is making her nauseated. She reports that she had a video visit a few days ago, and her ear pain worsened after that.   She also wants [place] to send in Zyrtec-D into the pharmacy so she can buy it with her flex spending acct. [facility]. Thanks!

## 2018-12-07 ENCOUNTER — Ambulatory Visit (INDEPENDENT_AMBULATORY_CARE_PROVIDER_SITE_OTHER): Payer: BC Managed Care – PPO | Admitting: Family Medicine

## 2018-12-07 ENCOUNTER — Other Ambulatory Visit: Payer: Self-pay

## 2018-12-07 DIAGNOSIS — J45901 Unspecified asthma with (acute) exacerbation: Secondary | ICD-10-CM | POA: Diagnosis not present

## 2018-12-07 DIAGNOSIS — J309 Allergic rhinitis, unspecified: Secondary | ICD-10-CM | POA: Diagnosis not present

## 2018-12-07 DIAGNOSIS — H9201 Otalgia, right ear: Secondary | ICD-10-CM

## 2018-12-07 MED ORDER — PREDNISONE 10 MG PO TABS: ORAL_TABLET | ORAL | 0 refills | Status: AC

## 2018-12-07 MED ORDER — MOMETASONE FUROATE 50 MCG/ACT NA SUSP: 2.0000 | Freq: Every day | NASAL | 12 refills | Status: DC

## 2018-12-07 MED ORDER — LEVOFLOXACIN 750 MG PO TABS: 750.0000 mg | ORAL_TABLET | Freq: Every day | ORAL | 0 refills | Status: AC

## 2018-12-07 NOTE — Patient Instructions (Signed)
.   Please review the attached list of medications and notify my office if there are any errors.   . Please bring all of your medications to every appointment so we can make sure that our medication list is the same as yours.   

## 2018-12-07 NOTE — Progress Notes (Signed)
Patient: Erin Sherman Female    DOB: April 04, 1966   53 y.o.   MRN: [id number] Visit Date: 12/07/2018  Today's Provider: Lelon Huh, MD   Chief Complaint  Patient presents with  . Asthma  Virtual Visit via Video Note  I connected with Erin Sherman on 12/07/18 at  4:00 PM EDT by a video enabled telemedicine application and verified that I am speaking with the correct person using two identifiers.   I discussed the limitations of evaluation and management by telemedicine and the availability of in person appointments. The patient expressed understanding and agreed to proceed.    Subjective:     Asthma  She complains of shortness of breath and wheezing. There is no cough. This is a recurrent problem. The problem has been gradually worsening. Associated symptoms include dyspnea on exertion, ear pain and malaise/fatigue. Pertinent negatives include no appetite change, fever, headaches, postnasal drip, rhinorrhea, sore throat or trouble swallowing. Her past medical history is significant for asthma.   Had virtual visit on 5/18 for asthma flare and prescribed prednisone and Augmentin, and states asthma sx cleared up, however she has also had persistent pain and swelling anerior to right ear which has not improved.  Denies any fevers, chills, or sweats. States asthma inhalers continue to work well.      Allergies  Allergen Reactions  . Latex     swelling  . Sulfa Antibiotics     Hives, itching  . Meperidine Rash     Current Outpatient Medications:  .  albuterol (PROVENTIL HFA) 108 (90 Base) MCG/ACT inhaler, Inhale 2 puffs into the lungs every 6 (six) hours as needed., Disp: 18 g, Rfl: 6 .  budesonide-formoterol (SYMBICORT) 160-4.5 MCG/ACT inhaler, Inhale 2 puffs into the lungs 2 (two) times daily., Disp: 1 Inhaler, Rfl: 12 .  calcium carbonate (TUMS) 500 MG chewable tablet, Chew 1 tablet by mouth daily., Disp: , Rfl:  .  cholecalciferol (VITAMIN D) 1000 UNITS  tablet, Take 1,000 Units by mouth daily., Disp: , Rfl:  .  cyclobenzaprine (FLEXERIL) 5 MG tablet, Take 1-2 tablets (5-10 mg total) by mouth at bedtime., Disp: 60 tablet, Rfl: 2 .  diclofenac sodium (VOLTAREN) 1 % GEL, Apply 4 g topically 4 (four) times daily., Disp: 100 g, Rfl: 1 .  EPINEPHrine (EPIPEN 2-PAK) 0.3 mg/0.3 mL IJ SOAJ injection, Inject 0.3 mLs (0.3 mg total) into the muscle as needed. With allergic reaction, Disp: 1 Device, Rfl: 3 .  ipratropium-albuterol (DUONEB) 0.5-2.5 (3) MG/3ML SOLN, Inhale 3 mLs into the lungs every 6 (six) hours as needed. Reported on 12/28/2015, Disp: 360 mL, Rfl: 3 .  methocarbamol (ROBAXIN) 500 MG tablet, Take 1 tablet (500 mg total) by mouth 2 (two) times daily as needed for muscle spasms., Disp: 60 tablet, Rfl: 3 .  montelukast (SINGULAIR) 10 MG tablet, Take 1 tablet (10 mg total) by mouth at bedtime., Disp: 30 tablet, Rfl: 5 .  oxyCODONE-acetaminophen (PERCOCET) 7.5-325 MG tablet, Take 1 tablet by mouth every 6 (six) hours as needed for severe pain., Disp: 30 tablet, Rfl: 0 .  promethazine (PHENERGAN) 25 MG tablet, Take 1 tablet (25 mg total) by mouth every 8 (eight) hours as needed for nausea., Disp: 20 tablet, Rfl: 0 .  Spacer/Aero-Holding Chambers (OPTICHAMBER ADVANTAGE) MISC, 1 each by Other route once. Always uses her when you're using a metered-dose inhaler. You've aromatase medicine as much, he won't have his much side effect, but you it twice as much  medicine and your lungs., Disp: 1 each, Rfl: 0 .  triamcinolone cream (KENALOG) 0.5 %, Apply 1 application topically 3 (three) times daily., Disp: 45 g, Rfl: 0 .  ZYRTEC-D ALLERGY & CONGESTION 5-120 MG tablet, TAKE 1 TABLET BY MOUTH DAILY. AS NEEDED FOR ALLERGIES, Disp: 24 tablet, Rfl: 4 .  diclofenac (FLECTOR) 1.3 % PTCH, Place 1 patch onto the skin 2 (two) times daily as needed. (Patient not taking: Reported on 11/19/2018), Disp: 60 patch, Rfl: 5 .  ELDERBERRY PO, Take by mouth., Disp: , Rfl:  .   mometasone (NASONEX) 50 MCG/ACT nasal spray, Place 2 sprays into the nose daily., Disp: 17 g, Rfl: 2  Review of Systems  Constitutional: Positive for fatigue and malaise/fatigue. Negative for activity change, appetite change, chills, diaphoresis, fever and unexpected weight change.  HENT: Positive for congestion, ear pain, sinus pressure and sinus pain. Negative for ear discharge, postnasal drip, rhinorrhea, sore throat, tinnitus, trouble swallowing and voice change.   Respiratory: Positive for chest tightness, shortness of breath and wheezing. Negative for apnea, cough, choking and stridor.   Cardiovascular: Positive for dyspnea on exertion.  Neurological: Positive for light-headedness. Negative for dizziness and headaches.    Social History   Tobacco Use  . Smoking status: Former Smoker    Packs/day: 0.30    Years: 25.00    Pack years: 7.50    Types: Cigarettes    Last attempt to quit: 07/16/2008    Years since quitting: 10.4  . Smokeless tobacco: Never Used  Substance Use Topics  . Alcohol use: Yes    Comment: occasional beer.      Objective:   There were no vitals taken for this visit.    Physical Exam  General appearance: alert, well developed, well nourished, cooperative and in no distress Head: Normocephalic, without obvious abnormality, atraumatic. Swelling observed anterior to right ear which is tender when patient palpates it.  Respiratory: Respirations even and unlabored, normal respiratory rate Extremities: No gross deformities Skin: Skin color, texture, turgor normal. No rashes seen  Psych: Appropriate mood and affect. Neurologic: Mental status: Alert, oriented to person, place, and time, thought content appropriate.     Assessment & Plan    1. Mild asthma with exacerbation, unspecified whether persistent Improved last month with prednisone and augmenting - levofloxacin (LEVAQUIN) 750 MG tablet; Take 1 tablet (750 mg total) by mouth daily for 7 days.   Dispense: 7 tablet; Refill: 0 - predniSONE (DELTASONE) 10 MG tablet; 6 tablets for 2 days, then 5 for 2 days, then 4 for 2 days, then 3 for 2 days, then 2 for 2 days, then 1 for 2 days.  Dispense: 42 tablet; Refill: 0  2. Right ear pain No improvement when prescribed Augmentin last month. Appears to have some pre-auricular LAD prescription.  levofloxacin (LEVAQUIN) 750 MG tablet; Take 1 tablet (750 mg total) by mouth daily for 7 days.  Dispense: 7 tablet; Refill: 0  Consider ENT referral if ear pain does not resolve on levofloxacin.   3. Allergic rhinitis refill - mometasone (NASONEX) 50 MCG/ACT nasal spray; Place 2 sprays into the nose daily.  Dispense: 17 g; Refill: 12       I discussed the assessment and treatment plan with the patient. The patient was provided an opportunity to ask questions and all were answered. The patient agreed with the plan and demonstrated an understanding of the instructions.   The patient was advised to call back or seek an in-person evaluation if the  symptoms worsen or if the condition fails to improve as anticipated.  I provided 12 minutes of non-face-to-face time during this encounter.   Lelon Huh, MD  [facility]

## 2018-12-10 ENCOUNTER — Telehealth: Payer: Self-pay

## 2018-12-10 NOTE — Telephone Encounter (Signed)
Patient reports that her FMLA is denied due to paper work not being faxed. Patient would like to paper work to be filled out to and faxed.

## 2018-12-10 NOTE — Telephone Encounter (Signed)
I've got FMLA ready to fax for 5/18 and 5/19. Has she had any other days since then?Marland Kitchen

## 2018-12-11 NOTE — Telephone Encounter (Signed)
Pt states they never received the paper work.  She is requesting it to be faxed again today.  She also states she missed June 4, 5, 6 as well.    Please advise.   Thanks,   -Mickel Baas

## 2018-12-12 ENCOUNTER — Telehealth: Payer: Self-pay

## 2018-12-12 NOTE — Telephone Encounter (Signed)
Patient called requesting her FMLA paperwork be faxed.

## 2018-12-12 NOTE — Telephone Encounter (Signed)
Forms have been faxed.   Thanks,   -Mickel Baas

## 2018-12-12 NOTE — Telephone Encounter (Signed)
FMLA paper work has been faxed.   Thanks,   -Mickel Baas

## 2018-12-12 NOTE — Telephone Encounter (Signed)
Forms completed, please fax

## 2019-01-02 ENCOUNTER — Other Ambulatory Visit: Payer: Self-pay | Admitting: Family Medicine

## 2019-01-02 DIAGNOSIS — L282 Other prurigo: Secondary | ICD-10-CM

## 2019-01-10 ENCOUNTER — Other Ambulatory Visit: Payer: Self-pay

## 2019-01-10 DIAGNOSIS — T782XXD Anaphylactic shock, unspecified, subsequent encounter: Secondary | ICD-10-CM

## 2019-01-10 MED ORDER — EPINEPHRINE 0.3 MG/0.3ML IJ SOAJ: 0.3000 mg | INTRAMUSCULAR | 1 refills | Status: DC | PRN

## 2019-01-10 NOTE — Telephone Encounter (Signed)
Patient states that she had used her epi-pen this morning and needs refill. KW

## 2019-01-23 ENCOUNTER — Other Ambulatory Visit: Payer: Self-pay | Admitting: Family Medicine

## 2019-01-23 DIAGNOSIS — J4540 Moderate persistent asthma, uncomplicated: Secondary | ICD-10-CM

## 2019-01-23 DIAGNOSIS — M2550 Pain in unspecified joint: Secondary | ICD-10-CM

## 2019-01-23 DIAGNOSIS — J301 Allergic rhinitis due to pollen: Secondary | ICD-10-CM

## 2019-01-23 MED ORDER — ZYRTEC-D ALLERGY & CONGESTION 5-120 MG PO TB12: ORAL_TABLET | ORAL | 4 refills | Status: DC

## 2019-01-23 MED ORDER — ALBUTEROL SULFATE HFA 108 (90 BASE) MCG/ACT IN AERS: 2.0000 | INHALATION_SPRAY | Freq: Four times a day (QID) | RESPIRATORY_TRACT | 6 refills | Status: DC | PRN

## 2019-01-23 NOTE — Telephone Encounter (Signed)
Please see note below.  Pt would like something for her arthritis pain.     Thanks,   -Mickel Baas

## 2019-01-23 NOTE — Telephone Encounter (Signed)
Pt called with rt side pain, & shoulder pain  She said her whole right side hurts from arthritis.  She is meeting with her surgeon August 14th .  [facility]  Please advise  [phone]  Con [place]

## 2019-01-24 ENCOUNTER — Other Ambulatory Visit: Payer: Self-pay | Admitting: Family Medicine

## 2019-01-24 DIAGNOSIS — M5410 Radiculopathy, site unspecified: Secondary | ICD-10-CM

## 2019-01-24 DIAGNOSIS — M5136 Other intervertebral disc degeneration, lumbar region: Secondary | ICD-10-CM

## 2019-01-24 MED ORDER — NAPROXEN 500 MG PO TABS: 500.0000 mg | ORAL_TABLET | Freq: Two times a day (BID) | ORAL | 0 refills | Status: DC

## 2019-01-24 MED ORDER — OXYCODONE-ACETAMINOPHEN 7.5-325 MG PO TABS: 1.0000 | ORAL_TABLET | Freq: Four times a day (QID) | ORAL | 0 refills | Status: DC | PRN

## 2019-01-24 NOTE — Telephone Encounter (Signed)
Pt called saying she is having what she thinks is bursitis pain in her shoulder and spine.  She said she has been in a lot of pain and the Naproxen doesn't help with the pain  She would like a refill on her Oxycodone 7.5 325  [facility]

## 2019-01-24 NOTE — Addendum Note (Signed)
Addended by: Lelon Huh E on: 01/24/2019 01:00 PM   Modules accepted: Orders

## 2019-01-25 ENCOUNTER — Telehealth: Payer: Self-pay

## 2019-01-25 DIAGNOSIS — J0110 Acute frontal sinusitis, unspecified: Secondary | ICD-10-CM

## 2019-01-25 DIAGNOSIS — J45901 Unspecified asthma with (acute) exacerbation: Secondary | ICD-10-CM

## 2019-01-25 MED ORDER — AMOXICILLIN 500 MG PO CAPS: 1000.0000 mg | ORAL_CAPSULE | Freq: Two times a day (BID) | ORAL | 0 refills | Status: DC

## 2019-01-25 NOTE — Telephone Encounter (Signed)
Patient calling that she saw Dr.Fisher back in June for ear infection and on that same area reports that she has a lymph node and would like an antibiotic be send to her pharmacy please. Advised patient that she needs an appointment since it was since June. She asked if the message can be send to Dr.Fisher.  CB# [phone]  [facility] [place]

## 2019-02-07 ENCOUNTER — Telehealth: Payer: Self-pay | Admitting: Family Medicine

## 2019-02-07 DIAGNOSIS — E669 Obesity, unspecified: Secondary | ICD-10-CM

## 2019-02-07 MED ORDER — PHENTERMINE HCL 37.5 MG PO CAPS: 37.5000 mg | ORAL_CAPSULE | ORAL | 3 refills | Status: DC

## 2019-02-07 NOTE — Telephone Encounter (Signed)
Pt contacted office for refill request on the following medications:  phentermine 37.5 MG capsule  [facility] LOV: 12/07/2018 Please advise. Thanks TNP

## 2019-02-08 ENCOUNTER — Telehealth: Payer: Self-pay | Admitting: Family Medicine

## 2019-02-08 NOTE — Telephone Encounter (Signed)
Pt wanting to check to see if her Phenphen was called in.  Please call pt back to let her know.  Thanks, [employer]

## 2019-03-20 ENCOUNTER — Telehealth: Payer: Self-pay | Admitting: Family Medicine

## 2019-03-20 ENCOUNTER — Ambulatory Visit: Payer: BC Managed Care – PPO | Admitting: Family Medicine

## 2019-03-20 ENCOUNTER — Encounter: Payer: Self-pay | Admitting: Family Medicine

## 2019-03-20 ENCOUNTER — Other Ambulatory Visit: Payer: Self-pay

## 2019-03-20 VITALS — BP 143/78 | HR 86 | Temp 96.8°F | Wt 268.0 lb

## 2019-03-20 DIAGNOSIS — M5136 Other intervertebral disc degeneration, lumbar region: Secondary | ICD-10-CM

## 2019-03-20 MED ORDER — KETOROLAC TROMETHAMINE 60 MG/2ML IM SOLN
60.0000 mg | Freq: Once | INTRAMUSCULAR | Status: AC
  Administered 2019-03-20: 60 mg via INTRAMUSCULAR

## 2019-03-20 NOTE — Progress Notes (Signed)
Patient: Erin Sherman Female    DOB: 1965/10/26   53 y.o.   MRN: [id number] Visit Date: 03/20/2019  Today's Provider: Lavon Paganini, MD   Chief Complaint  Patient presents with  . Back Pain   Subjective:    I, Porsha McClurkin CMA, am acting as a scribe for Lavon Paganini, MD.   Back Pain This is a recurrent problem. The current episode started more than 1 year ago. The problem occurs constantly. The problem is unchanged. The pain is present in the lumbar spine and thoracic spine. The quality of the pain is described as burning. The pain does not radiate. The pain is at a severity of 10/10. The pain is severe. The pain is the same all the time. The symptoms are aggravated by bending, standing, position, lying down, sitting and twisting. She has tried muscle relaxant for the symptoms.   Was more active this weekend than usual with more bending and twisting and pulling.  Was given a shot into R hip by Ortho last month.  She is awaiting seeing a Spine specialist later this month.  She is taking Percocet.  Allergies  Allergen Reactions  . Latex     swelling  . Sulfa Antibiotics     Hives, itching  . Meperidine Rash     Current Outpatient Medications:  .  albuterol (PROVENTIL HFA) 108 (90 Base) MCG/ACT inhaler, Inhale 2 puffs into the lungs every 6 (six) hours as needed., Disp: 18 g, Rfl: 6 .  budesonide-formoterol (SYMBICORT) 160-4.5 MCG/ACT inhaler, Inhale 2 puffs into the lungs 2 (two) times daily., Disp: 1 Inhaler, Rfl: 12 .  calcium carbonate (TUMS) 500 MG chewable tablet, Chew 1 tablet by mouth daily., Disp: , Rfl:  .  cholecalciferol (VITAMIN D) 1000 UNITS tablet, Take 1,000 Units by mouth daily., Disp: , Rfl:  .  cyclobenzaprine (FLEXERIL) 5 MG tablet, Take 1-2 tablets (5-10 mg total) by mouth at bedtime., Disp: 60 tablet, Rfl: 2 .  diclofenac sodium (VOLTAREN) 1 % GEL, Apply 4 g topically 4 (four) times daily., Disp: 100 g, Rfl: 1 .  ELDERBERRY PO, Take by  mouth., Disp: , Rfl:  .  EPINEPHrine (EPIPEN 2-PAK) 0.3 mg/0.3 mL IJ SOAJ injection, Inject 0.3 mLs (0.3 mg total) into the muscle as needed. With allergic reaction, Disp: 2 each, Rfl: 1 .  ipratropium-albuterol (DUONEB) 0.5-2.5 (3) MG/3ML SOLN, Inhale 3 mLs into the lungs every 6 (six) hours as needed. Reported on 12/28/2015, Disp: 360 mL, Rfl: 3 .  methocarbamol (ROBAXIN) 500 MG tablet, Take 1 tablet (500 mg total) by mouth 2 (two) times daily as needed for muscle spasms., Disp: 60 tablet, Rfl: 3 .  mometasone (NASONEX) 50 MCG/ACT nasal spray, Place 2 sprays into the nose daily., Disp: 17 g, Rfl: 12 .  montelukast (SINGULAIR) 10 MG tablet, Take 1 tablet (10 mg total) by mouth at bedtime., Disp: 30 tablet, Rfl: 5 .  naproxen (NAPROSYN) 500 MG tablet, Take 1 tablet (500 mg total) by mouth 2 (two) times daily with a meal., Disp: 30 tablet, Rfl: 0 .  oxyCODONE-acetaminophen (PERCOCET) 7.5-325 MG tablet, Take 1 tablet by mouth every 6 (six) hours as needed for severe pain., Disp: 30 tablet, Rfl: 0 .  phentermine 37.5 MG capsule, Take 1 capsule (37.5 mg total) by mouth every morning. DO NOT TAKE THIS MEDICATION WHEN TAKING ZYRTEC-D, Disp: 30 capsule, Rfl: 3 .  promethazine (PHENERGAN) 25 MG tablet, Take 1 tablet (25 mg total) by mouth every  8 (eight) hours as needed for nausea., Disp: 20 tablet, Rfl: 0 .  Spacer/Aero-Holding Chambers ([place]) MISC, 1 each by Other route once. Always uses her when you're using a metered-dose inhaler. You've aromatase medicine as much, he won't have his much side effect, but you it twice as much medicine and your lungs., Disp: 1 each, Rfl: 0 .  triamcinolone cream (KENALOG) 0.5 %, APPLY 1 APPLICATION TOPICALLY 3 (THREE) TIMES DAILY., Disp: 45 g, Rfl: 2 .  ZYRTEC-D ALLERGY & CONGESTION 5-120 MG tablet, TAKE 1 TABLET BY MOUTH DAILY. AS NEEDED FOR ALLERGIES, Disp: 24 tablet, Rfl: 4 .  diclofenac (FLECTOR) 1.3 % PTCH, Place 1 patch onto the skin 2 (two) times daily  as needed. (Patient not taking: Reported on 11/19/2018), Disp: 60 patch, Rfl: 5  Review of Systems  Musculoskeletal: Positive for back pain.    Social History   Tobacco Use  . Smoking status: Former Smoker    Packs/day: 0.30    Years: 25.00    Pack years: 7.50    Types: Cigarettes    Quit date: 07/16/2008    Years since quitting: 10.6  . Smokeless tobacco: Never Used  Substance Use Topics  . Alcohol use: Yes    Comment: occasional beer.      Objective:   BP (!) 143/78 (BP Location: Left Arm, Patient Position: Sitting, Cuff Size: Large)   Pulse 86   Temp (!) 96.8 F (36 C) (Temporal)   Wt 268 lb (121.6 kg)   SpO2 96%   BMI 52.34 kg/m  Vitals:   03/20/19 1539  BP: (!) 143/78  Pulse: 86  Temp: (!) 96.8 F (36 C)  TempSrc: Temporal  SpO2: 96%  Weight: 268 lb (121.6 kg)  Body mass index is 52.34 kg/m.   Physical Exam Vitals signs reviewed.  Constitutional:      General: She is not in acute distress.    Appearance: Normal appearance. She is well-developed. She is not diaphoretic.  HENT:     Head: Normocephalic and atraumatic.  Eyes:     General: No scleral icterus.    Conjunctiva/sclera: Conjunctivae normal.  Neck:     Musculoskeletal: Neck supple.     Thyroid: No thyromegaly.  Cardiovascular:     Rate and Rhythm: Normal rate and regular rhythm.     Pulses: Normal pulses.     Heart sounds: Normal heart sounds. No murmur.  Pulmonary:     Effort: Pulmonary effort is normal. No respiratory distress.     Breath sounds: Normal breath sounds. No wheezing, rhonchi or rales.  Musculoskeletal:     Right lower leg: No edema.     Left lower leg: No edema.     Comments: Back: Gait intact.  Range of motion limited in flexion and extension, likely related to habitus.  Lower extremity strength and sensation intact.  Negative straight leg raise bilaterally.  Tenderness to palpation diffusely over her back  Lymphadenopathy:     Cervical: No cervical adenopathy.  Skin:     General: Skin is warm and dry.     Capillary Refill: Capillary refill takes less than 2 seconds.     Findings: No rash.  Neurological:     Mental Status: She is alert and oriented to person, place, and time. Mental status is at baseline.      No results found for any visits on 03/20/19.     Assessment & Plan   1. DDD (degenerative disc disease), lumbar -Chronic with recent exacerbation -  Discussed with patient that this exacerbation is likely muscular -Reassured her that she has no red flag signs and no signs of radiculopathy at this time - Discussed that she can continue her home medications including muscle relaxer and pain medication as prescribed by her PCP -We will give her IM Toradol 60 mg x 1 today -Discussed home exercise program -Has upcoming appointment with spine specialist per patient - ketorolac (TORADOL) injection 60 mg    Return if symptoms worsen or fail to improve.   The entirety of the information documented in the History of Present Illness, Review of Systems and Physical Exam were personally obtained by me. Portions of this information were initially documented by [facility], CMA and reviewed by me for thoroughness and accuracy.    Maxim Bedel, Dionne Bucy, MD MPH [facility]

## 2019-03-20 NOTE — Assessment & Plan Note (Signed)
Chronic and uncontrolled Seeing

## 2019-04-17 ENCOUNTER — Telehealth: Payer: Self-pay | Admitting: Family Medicine
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
## 2019-04-17 NOTE — Telephone Encounter (Signed)
This is something she needs to be seen for by Dr. Caryn Section.

## 2019-04-17 NOTE — Telephone Encounter (Signed)
Pt called back.  She hasn't heard anything back yet.  Please call pt asap.  Willing to take something if it can be called in to:  [id number] [place], [place] - [address] [phone] (Phone) [phone] (Fax)   Thanks, [employer]

## 2019-04-17 NOTE — Telephone Encounter (Signed)
Pt's anxiety is increasing.  She is needing some advice on what to do until her appt w/ Dr. Caryn Section on Friday.  Please call [phone].  Please advise.  Thanks, [employer]

## 2019-04-19 ENCOUNTER — Ambulatory Visit (INDEPENDENT_AMBULATORY_CARE_PROVIDER_SITE_OTHER): Payer: BC Managed Care – PPO | Admitting: Family Medicine

## 2019-04-19 ENCOUNTER — Other Ambulatory Visit: Payer: Self-pay

## 2019-04-19 ENCOUNTER — Encounter: Payer: Self-pay | Admitting: Family Medicine

## 2019-04-19 VITALS — BP 157/80

## 2019-04-19 DIAGNOSIS — F5104 Psychophysiologic insomnia: Secondary | ICD-10-CM

## 2019-04-19 DIAGNOSIS — M5136 Other intervertebral disc degeneration, lumbar region: Secondary | ICD-10-CM

## 2019-04-19 DIAGNOSIS — F419 Anxiety disorder, unspecified: Secondary | ICD-10-CM | POA: Diagnosis not present

## 2019-04-19 MED ORDER — HYDROXYZINE PAMOATE 25 MG PO CAPS: 25.0000 mg | ORAL_CAPSULE | Freq: Three times a day (TID) | ORAL | 0 refills | Status: DC | PRN

## 2019-04-19 MED ORDER — CYCLOBENZAPRINE HCL 5 MG PO TABS: 5.0000 mg | ORAL_TABLET | Freq: Every day | ORAL | 2 refills | Status: DC

## 2019-04-19 MED ORDER — SERTRALINE HCL 50 MG PO TABS: ORAL_TABLET | ORAL | 1 refills | Status: DC
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
## 2019-04-19 NOTE — Patient Instructions (Signed)
.   Please review the attached list of medications and notify my office if there are any errors.   . Please bring all of your medications to every appointment so we can make sure that our medication list is the same as yours.   . It is especially important to get the annual flu vaccine this year. If you haven't had it already, please go to your pharmacy or call the office as soon as possible to schedule you flu shot.  

## 2019-05-02 ENCOUNTER — Telehealth: Payer: Self-pay | Admitting: Family Medicine

## 2019-05-02 NOTE — Telephone Encounter (Signed)
Patient was started on sertraline earlier this month for anxiety. Please see if she is having any trouble with medication. It usually take 2-4 weeks to help and we usually tolerate dose up to 100mg . Her 50mg  prescription should be running low. We can send in refill for 100 or stick the 50s if that is working well.

## 2019-05-02 NOTE — Telephone Encounter (Signed)
Pt called saying she thinks she needs a steroid for her asthma. She ask for an appt with Dr. Caryn Section and there were no appts for today with anyone.  Pt sounded hoarse and congested.  I advise her to go to the ER or an urgent care to be checked out.  She agreed.   Con Memos

## 2019-05-03 ENCOUNTER — Other Ambulatory Visit: Payer: Self-pay | Admitting: Family Medicine

## 2019-05-03 DIAGNOSIS — J4540 Moderate persistent asthma, uncomplicated: Secondary | ICD-10-CM

## 2019-05-03 MED ORDER — BUDESONIDE-FORMOTEROL FUMARATE 160-4.5 MCG/ACT IN AERO: 2.0000 | INHALATION_SPRAY | Freq: Two times a day (BID) | RESPIRATORY_TRACT | 12 refills | Status: DC

## 2019-05-03 MED ORDER — PREDNISONE 10 MG PO TABS: ORAL_TABLET | ORAL | 0 refills | Status: AC

## 2019-05-03 MED ORDER — ALBUTEROL SULFATE HFA 108 (90 BASE) MCG/ACT IN AERS: 2.0000 | INHALATION_SPRAY | Freq: Four times a day (QID) | RESPIRATORY_TRACT | 6 refills | Status: DC | PRN

## 2019-05-03 NOTE — Telephone Encounter (Signed)
Pt is having a asthma flair up.  There are no available appts.  Asking for refills on:  Inhalers Decongestant  Mucinex  [facility]:  [id number] [place], [place] [phone] (Phone) [phone] (Fax)   Please call pt back to let her know if this can be called in at [phone].  Thanks, [employer]

## 2019-05-06 ENCOUNTER — Telehealth: Payer: Self-pay

## 2019-05-06 NOTE — Telephone Encounter (Signed)
Pt stated that she thought she was supposed to take 6 of predniSONE (DELTASONE) 10 MG tablet on day 1 & 2 but she realized she wouldn't have enough medication but she already took 6 tablets on day 1 & 2. Pt is requesting a call back to advised how she should take the remaining 9 tablets. Please advise. Thanks TNP

## 2019-05-07 NOTE — Telephone Encounter (Signed)
Patient advised.

## 2019-05-07 NOTE — Telephone Encounter (Signed)
Take 3 tablets for 1 day, then 2 tablets for 1 day, then 1 every day until gone.

## 2019-05-11 ENCOUNTER — Other Ambulatory Visit: Payer: Self-pay | Admitting: Family Medicine

## 2019-05-11 DIAGNOSIS — F419 Anxiety disorder, unspecified: Secondary | ICD-10-CM

## 2019-05-25 ENCOUNTER — Encounter: Payer: Self-pay | Admitting: Intensive Care

## 2019-05-25 ENCOUNTER — Other Ambulatory Visit: Payer: Self-pay

## 2019-05-25 ENCOUNTER — Emergency Department: Payer: BC Managed Care – PPO

## 2019-05-25 ENCOUNTER — Emergency Department
Admission: EM | Admit: 2019-05-25 | Discharge: 2019-05-25 | Disposition: A | Discharge status: Home / Self Care | Payer: BC Managed Care – PPO | Attending: Emergency Medicine | Admitting: Emergency Medicine

## 2019-05-25 DIAGNOSIS — Y939 Activity, unspecified: Secondary | ICD-10-CM | POA: Diagnosis not present

## 2019-05-25 DIAGNOSIS — Y929 Unspecified place or not applicable: Secondary | ICD-10-CM | POA: Diagnosis not present

## 2019-05-25 DIAGNOSIS — Z87891 Personal history of nicotine dependence: Secondary | ICD-10-CM | POA: Insufficient documentation

## 2019-05-25 DIAGNOSIS — J45909 Unspecified asthma, uncomplicated: Secondary | ICD-10-CM | POA: Diagnosis not present

## 2019-05-25 DIAGNOSIS — Z79899 Other long term (current) drug therapy: Secondary | ICD-10-CM | POA: Insufficient documentation

## 2019-05-25 DIAGNOSIS — I252 Old myocardial infarction: Secondary | ICD-10-CM | POA: Insufficient documentation

## 2019-05-25 DIAGNOSIS — Y999 Unspecified external cause status: Secondary | ICD-10-CM | POA: Diagnosis not present

## 2019-05-25 DIAGNOSIS — W01190A Fall on same level from slipping, tripping and stumbling with subsequent striking against furniture, initial encounter: Secondary | ICD-10-CM | POA: Insufficient documentation

## 2019-05-25 DIAGNOSIS — Z8673 Personal history of transient ischemic attack (TIA), and cerebral infarction without residual deficits: Secondary | ICD-10-CM | POA: Diagnosis not present

## 2019-05-25 DIAGNOSIS — Z91040 Latex allergy status: Secondary | ICD-10-CM | POA: Insufficient documentation

## 2019-05-25 DIAGNOSIS — S060X0A Concussion without loss of consciousness, initial encounter: Secondary | ICD-10-CM | POA: Diagnosis not present

## 2019-05-25 DIAGNOSIS — S0990XA Unspecified injury of head, initial encounter: Secondary | ICD-10-CM | POA: Diagnosis present

## 2019-05-25 MED ORDER — ONDANSETRON 4 MG PO TBDP: 4.0000 mg | ORAL_TABLET | Freq: Three times a day (TID) | ORAL | 0 refills | Status: DC | PRN

## 2019-05-25 MED ORDER — BUTALBITAL-APAP-CAFFEINE 50-325-40 MG PO TABS: ORAL_TABLET | ORAL | 0 refills | Status: DC

## 2019-05-25 NOTE — Discharge Instructions (Signed)
Please follow-up with your primary care provider if her symptoms are not improving over the week.  Rest and take medications as prescribed.  Return to the emergency department for symptoms of concern if unable to see your primary care provider.

## 2019-05-25 NOTE — ED Provider Notes (Signed)
[facility] Emergency Department Provider Note ____________________________________________  Time seen: Approximately 1:27 PM  I have reviewed the triage vital signs and the nursing notes.   HISTORY  Chief Complaint Head Injury and Dizziness   HPI Erin Sherman is a 53 y.o. female presenting to the emergency department for treatment and evaluation of head injury that occurred 2 days ago.  She tripped and hit her head on the [place] cabinet.  She is unsure of loss of consciousness but is complaining of a continuous headache.  She is not currently on any blood thinners.   Location: Diffuse Similar to previous headaches: no Duration: constant TIMING: After injury SEVERITY: 6/10 QUALITY: throbbing, tightness CONTEXT: Not related to exposure MODIFYING FACTORS: None ASSOCIATED SYMPTOMS: photophobia, dizziness with position change, nausea  Past Medical History:  Diagnosis Date  . Cervical cancer ([place])   . Hx of bronchitis   . MI (myocardial infarction) ([place])   . Migraines   . Stomach ulcer   . Stroke [facility])     Patient Active Problem List   Diagnosis Date Noted  . Hyperplastic polyp of sigmoid colon 11/29/2016  . Polyp of sigmoid colon   . Itching 11/17/2015  . Trochanteric bursitis of right hip 07/01/2015  . Low back pain 07/01/2015  . DDD (degenerative disc disease), lumbar 05/15/2015  . Carpal tunnel syndrome 05/07/2015  . Hip pain 05/07/2015  . History of cervical cancer 05/07/2015  . HLD (hyperlipidemia) 05/07/2015  . Menopausal symptom 05/07/2015  . Obesity 05/07/2015  . 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 Urinary incontinence 05/07/2015  . Nausea 05/07/2015  . Asthma, chronic 07/16/2013  . Allergic rhinitis 07/16/2013  . GERD (gastroesophageal reflux disease) 07/16/2013    Past Surgical History:  Procedure Laterality Date  . ABDOMINAL HYSTERECTOMY     with unilateral oophorectomy and part of cervix removed due to cervical cancer  .  CARPAL TUNNEL RELEASE Right 2013  . CARPAL TUNNEL RELEASE  03/02/2010   Endoscopic carpal tunnel release. Rica Mast, MD (Orthopedic, [facility])  . CESAREAN SECTION    . COLONOSCOPY WITH PROPOFOL N/A 11/29/2016   Procedure: COLONOSCOPY WITH PROPOFOL;  Surgeon: [name], MD;  Location: [facility] ENDOSCOPY;  Service: Endoscopy;  Laterality: N/A;  . exercise treadmill Test  05/31/2006   Normal  . TUBAL LIGATION      Prior to Admission medications   Medication Sig Start Date End Date Taking? Authorizing Provider  albuterol (PROVENTIL HFA) 108 (90 Base) MCG/ACT inhaler Inhale 2 puffs into the lungs every 6 (six) hours as needed. 05/03/19   Birdie Sons, MD  butalbital-acetaminophen-caffeine (FIORICET) [phone] MG tablet 1-2 tablets every 6 hours as needed for headache 05/25/19   Sherrie George B, FNP  calcium carbonate (TUMS) 500 MG chewable tablet Chew 1 tablet by mouth daily.    [provider]  cholecalciferol (VITAMIN D) 1000 UNITS tablet Take 1,000 Units by mouth daily.    [provider]  cyclobenzaprine (FLEXERIL) 5 MG tablet Take 1-2 tablets (5-10 mg total) by mouth at bedtime. 04/19/19   Birdie Sons, MD  diclofenac (FLECTOR) 1.3 % PTCH Place 1 patch onto the skin 2 (two) times daily as needed. 04/11/18   Birdie Sons, MD  diclofenac sodium (VOLTAREN) 1 % GEL Apply 4 g topically 4 (four) times daily. 09/25/18   Birdie Sons, MD  ELDERBERRY PO Take by mouth.    [provider]  EPINEPHrine (EPIPEN 2-PAK) 0.3 mg/0.3 mL IJ SOAJ injection Inject 0.3 mLs (  0.3 mg total) into the muscle as needed. With allergic reaction 01/10/19   Trinna Post, PA-C  Fluticasone-Salmeterol (ADVAIR) 250-50 MCG/DOSE AEPB Inhale 1 puff into the lungs 2 (two) times daily. 05/03/19   Birdie Sons, MD  hydrOXYzine (VISTARIL) 25 MG capsule Take 1 capsule (25 mg total) by mouth 3 (three) times daily as needed for anxiety. 04/19/19   Birdie Sons, MD  ipratropium-albuterol  (DUONEB) 0.5-2.5 (3) MG/3ML SOLN Inhale 3 mLs into the lungs every 6 (six) hours as needed. Reported on 12/28/2015 12/28/15   Mar Daring, PA-C  methocarbamol (ROBAXIN) 500 MG tablet Take 1 tablet (500 mg total) by mouth 2 (two) times daily as needed for muscle spasms. 09/25/18   Birdie Sons, MD  mometasone (NASONEX) 50 MCG/ACT nasal spray Place 2 sprays into the nose daily. 12/07/18 12/07/19  Birdie Sons, MD  montelukast (SINGULAIR) 10 MG tablet Take 1 tablet (10 mg total) by mouth at bedtime. 10/09/17   Birdie Sons, MD  naproxen (NAPROSYN) 500 MG tablet Take 1 tablet (500 mg total) by mouth 2 (two) times daily with a meal. 01/24/19   Birdie Sons, MD  ondansetron (ZOFRAN-ODT) 4 MG disintegrating tablet Take 1 tablet (4 mg total) by mouth every 8 (eight) hours as needed for nausea or vomiting. 05/25/19   Trevell Pariseau, Johnette Abraham B, FNP  oxyCODONE-acetaminophen (PERCOCET) 7.5-325 MG tablet Take 1 tablet by mouth every 6 (six) hours as needed for severe pain. 01/24/19   Birdie Sons, MD  phentermine 37.5 MG capsule Take 1 capsule (37.5 mg total) by mouth every morning. DO NOT TAKE THIS MEDICATION WHEN TAKING ZYRTEC-D 02/07/19   Birdie Sons, MD  promethazine (PHENERGAN) 25 MG tablet Take 1 tablet (25 mg total) by mouth every 8 (eight) hours as needed for nausea. 08/03/18   Birdie Sons, MD  sertraline (ZOLOFT) 100 MG tablet Take 1 tablet (100 mg total) by mouth daily. 05/12/19   Birdie Sons, MD  Spacer/Aero-Holding [facility] ADVANTAGE) MISC 1 each by Other route once. Always uses her when you're using a metered-dose inhaler. You've aromatase medicine as much, he won't have his much side effect, but you it twice as much medicine and your lungs. 05/24/15   Ahmed Prima, MD  triamcinolone cream (KENALOG) 0.5 % APPLY 1 APPLICATION TOPICALLY 3 (THREE) TIMES DAILY. 01/03/19   Birdie Sons, MD  ZYRTEC-D ALLERGY & CONGESTION 5-120 MG tablet TAKE 1 TABLET BY MOUTH DAILY. AS  NEEDED FOR ALLERGIES 01/23/19   Birdie Sons, MD    Allergies Latex, Sulfa antibiotics, and Meperidine  Family History  Problem Relation Age of Onset  . Emphysema Mother   . Asthma Mother   . Diabetes Mother        type 2  . COPD Mother   . Hypertension Mother   . Emphysema Father   . Hypertension Father   . COPD Father   . Colon cancer Father        died of colon cancer 08/23/17  . Asthma Sister   . Asthma Brother   . Heart attack Brother   . Heart attack Maternal Grandmother   . Heart attack Paternal Grandmother   . Anemia Sister   . Asthma Son   . Cancer Maternal Grandfather        prostate  . Cancer Maternal Aunt        breast  . Cancer Other     Social History Social History  Tobacco Use  . Smoking status: Former Smoker    Packs/day: 0.30    Years: 25.00    Pack years: 7.50    Types: Cigarettes    Quit date: 07/16/2008    Years since quitting: 10.8  . Smokeless tobacco: Never Used  Substance Use Topics  . Alcohol use: Yes    Comment: occasional beer.  . Drug use: No    Review of Systems Constitutional: No fever/chills. Positive for recent injury. Eyes: Positive for photophobia. ENT: No sore throat. Respiratory: Denies shortness of breath. Gastrointestinal: No abdominal pain.  Positive for nausea, no vomiting.  No diarrhea.  No constipation. Musculoskeletal: Negative for pain. Skin: Negative for rash. Neurological:Positive for headache, negative for focal weakness or numbness. No confusion or fainting. ___________________________________________   PHYSICAL EXAM:  VITAL SIGNS: ED Triage Vitals  Enc Vitals Group     BP 05/25/19 1156 (!) 154/79     Pulse Rate 05/25/19 1156 97     Resp 05/25/19 1156 16     Temp 05/25/19 1156 99 F (37.2 C)     Temp Source 05/25/19 1156 Oral     SpO2 05/25/19 1156 98 %     Weight 05/25/19 1157 267 lb (121.1 kg)     Height 05/25/19 1157 5' (1.524 m)     Head Circumference --      Peak Flow --      Pain  Score 05/25/19 1157 10     Pain Loc --      Pain Edu? --      Excl. in [place]? --     Constitutional: Alert and oriented. Well appearing and in no acute distress. Eyes: Conjunctivae are normal. PERRL. EOMI without expressed pain. No evidence of papilledema on limited exam. Head: Tenderness along transverse parietal scalp. Nose: No congestion/rhinnorhea. Mouth/Throat: Mucous membranes are moist.  Oropharynx non-erythematous. Neck: No stridor. Supple, no meningismus.  Cardiovascular: Normal rate, regular rhythm. Grossly normal heart sounds.  Good peripheral circulation. Respiratory: Normal respiratory effort.  No retractions. Lungs CTAB. Gastrointestinal: Soft and nontender. No distention.  Musculoskeletal: No lower extremity tenderness nor edema.  No joint effusions. Neurologic:  Normal speech and language. No gross focal neurologic deficits are appreciated. No gait instability. Cranial nerves: 2-10 normal as tested. Cerebellar:Normal Romberg, finger-nose-finger, heel to shin, normal gait. Sensorimotor: No aphasia, pronator drift, clonus, sensory loss or abnormal reflexes.  Skin:  Skin is warm, dry and intact. No rash noted. Psychiatric: Mood and affect are normal. Speech and behavior are normal. Normal thought process and cognition.  ____________________________________________   LABS (all labs ordered are listed, but only abnormal results are displayed)  Labs Reviewed - No data to display ____________________________________________  EKG  ED ECG REPORT I, Gabriella Woodhead, FNP-BC personally viewed and interpreted this ECG.   Date: 05/25/2019  EKG Time: 1202  Rate: 96  Rhythm: normal EKG, normal sinus rhythm  Axis: normal  Intervals:none  ST&T Change: no STEMI  ____________________________________________  RADIOLOGY  Ct Head Wo Contrast  Result Date: 05/25/2019 CLINICAL DATA:  Trauma on Thursday.  Headache. EXAM: CT HEAD WITHOUT CONTRAST TECHNIQUE: Contiguous axial images  were obtained from the base of the skull through the vertex without intravenous contrast. COMPARISON:  05/25/2006 FINDINGS: Brain: No mass lesion, hemorrhage, hydrocephalus, acute infarct, intra-axial, or extra-axial fluid collection. Vascular: No hyperdense vessel or unexpected calcification. Skull: No significant soft tissue swelling.  Normal skull. Sinuses/Orbits: Normal imaged portions of the orbits and globes. Hypoplastic frontal sinuses. Otherwise normal paranasal sinuses and  mastoid air cells. Other: None. IMPRESSION: Normal head CT. Electronically Signed   By: Abigail Miyamoto M.D.   On: 05/25/2019 12:39   ____________________________________________   PROCEDURES  Procedure(s) performed:  Procedures  Critical Care performed: None ____________________________________________   INITIAL IMPRESSION / ASSESSMENT AND PLAN / ED COURSE  53 year old female presenting to the emergency department 2 days after head injury and continuous headache.  See HPI for further details.  CT of her head is negative for acute findings.  This is also consistent with her physical exam.  She does likely have a concussion based on symptoms.  Symptomatic treatment with Fioricet and Zofran will be given in addition to work note for the next couple of days.  She was encouraged to rest and limit her activities.  She was advised to follow-up with her primary care provider if symptoms are not improving over the week.  If she is worse and she is unable to schedule an appointment with primary care, she is to return to the emergency department.  Pertinent labs & imaging results that were available during my care of the patient were reviewed by me and considered in my medical decision making (see chart for details). ____________________________________________   FINAL CLINICAL IMPRESSION(S) / ED DIAGNOSES  Final diagnoses:  Concussion without loss of consciousness, initial encounter    ED Discharge Orders          Ordered    ondansetron (ZOFRAN-ODT) 4 MG disintegrating tablet  Every 8 hours PRN     05/25/19 1339    butalbital-acetaminophen-caffeine (FIORICET) 50-325-40 MG tablet     05/25/19 1339            Victorino Dike, FNP 05/25/19 1347    Delman Kitten, MD 05/25/19 [phone]

## 2019-05-25 NOTE — ED Triage Notes (Signed)
Patient tripped and hit her head on Thursday on [place] cabinet. Denies LOC. C/o headache. Denies taking blood thinners. No open cuts.

## 2019-05-28 ENCOUNTER — Other Ambulatory Visit: Payer: Self-pay | Admitting: Family Medicine

## 2019-05-28 ENCOUNTER — Telehealth: Payer: Self-pay | Admitting: Family Medicine

## 2019-05-28 ENCOUNTER — Ambulatory Visit (INDEPENDENT_AMBULATORY_CARE_PROVIDER_SITE_OTHER): Payer: BC Managed Care – PPO | Admitting: Family Medicine

## 2019-05-28 ENCOUNTER — Other Ambulatory Visit: Payer: Self-pay

## 2019-05-28 ENCOUNTER — Encounter: Payer: Self-pay | Admitting: Family Medicine

## 2019-05-28 VITALS — BP 122/75 | HR 101 | Temp 96.6°F | Wt 272.2 lb

## 2019-05-28 DIAGNOSIS — J4540 Moderate persistent asthma, uncomplicated: Secondary | ICD-10-CM

## 2019-05-28 DIAGNOSIS — R110 Nausea: Secondary | ICD-10-CM | POA: Diagnosis not present

## 2019-05-28 DIAGNOSIS — M5136 Other intervertebral disc degeneration, lumbar region: Secondary | ICD-10-CM

## 2019-05-28 DIAGNOSIS — G44319 Acute post-traumatic headache, not intractable: Secondary | ICD-10-CM | POA: Diagnosis not present

## 2019-05-28 MED ORDER — OXYCODONE-ACETAMINOPHEN 7.5-325 MG PO TABS: 1.0000 | ORAL_TABLET | Freq: Four times a day (QID) | ORAL | 0 refills | Status: DC | PRN

## 2019-05-28 MED ORDER — BUDESONIDE-FORMOTEROL FUMARATE 160-4.5 MCG/ACT IN AERO: 2.0000 | INHALATION_SPRAY | Freq: Two times a day (BID) | RESPIRATORY_TRACT | 12 refills | Status: DC

## 2019-05-28 MED ORDER — ALBUTEROL SULFATE HFA 108 (90 BASE) MCG/ACT IN AERS: 2.0000 | INHALATION_SPRAY | Freq: Four times a day (QID) | RESPIRATORY_TRACT | 6 refills | Status: DC | PRN

## 2019-05-28 MED ORDER — PREDNISONE 10 MG PO TABS: ORAL_TABLET | ORAL | 0 refills | Status: AC

## 2019-05-28 MED ORDER — PROVENTIL HFA 108 (90 BASE) MCG/ACT IN AERS: 2.0000 | INHALATION_SPRAY | Freq: Four times a day (QID) | RESPIRATORY_TRACT | 6 refills | Status: DC | PRN

## 2019-05-28 MED ORDER — PROMETHAZINE HCL 25 MG PO TABS: 25.0000 mg | ORAL_TABLET | Freq: Three times a day (TID) | ORAL | 0 refills | Status: DC | PRN

## 2019-05-28 NOTE — Telephone Encounter (Signed)
LMTCB, okay for [facility] to advise.

## 2019-05-28 NOTE — Progress Notes (Signed)
Patient: Erin Sherman Female    DOB: March 21, 1966   53 y.o.   MRN: [id number] Visit Date: 05/28/2019  Today's Provider: Lelon Huh, MD   Chief Complaint  Patient presents with  . Hospitalization Follow-up   Subjective:     HPI  Follow up ER visit  Patient was seen in ER for head injury and dizziness on 05/25/2019. She had lost her footing and fell back to hit her head and a cabinet on 05-23-2019 There was no LOC but she felt disoriented after initial injury. She has since had persistent global headache and feeling a little dazed prompting her ER visit on 11-21.  She was evaluated with normal head CT and treated for concussion. Treatment for this included Fioricet and Zofran. She was also given a work note for the next couple of days. She was encouraged to rest and limit her activities.  She was advised to follow-up with her primary care provider if symptoms did not improve over the week. She reports good compliance with treatment. She reports this condition is slowly improving. . She is experiencing headache, dizziness, and a asthma flare. She still feels like she is not thinking as clearly as usual.  States that the Fioricet and zofran have not helped. She did have prescription of phenergan at home which has worked well for nausea, but makes her sleepy. She also has prescription for oxycodone/apap which she states helps with the headache.  ------------------------------------------------------------------------------------   Allergies  Allergen Reactions  . Latex     swelling  . Sulfa Antibiotics     Hives, itching  . Meperidine Rash     Current Outpatient Medications:  .  albuterol (PROVENTIL HFA) 108 (90 Base) MCG/ACT inhaler, Inhale 2 puffs into the lungs every 6 (six) hours as needed., Disp: 18 g, Rfl: 6 .  butalbital-acetaminophen-caffeine (FIORICET) 50-325-40 MG tablet, 1-2 tablets every 6 hours as needed for headache, Disp: 20 tablet, Rfl: 0 .  calcium  carbonate (TUMS) 500 MG chewable tablet, Chew 1 tablet by mouth daily., Disp: , Rfl:  .  cholecalciferol (VITAMIN D) 1000 UNITS tablet, Take 1,000 Units by mouth daily., Disp: , Rfl:  .  cyclobenzaprine (FLEXERIL) 5 MG tablet, Take 1-2 tablets (5-10 mg total) by mouth at bedtime., Disp: 60 tablet, Rfl: 2 .  diclofenac (FLECTOR) 1.3 % PTCH, Place 1 patch onto the skin 2 (two) times daily as needed., Disp: 60 patch, Rfl: 5 .  diclofenac sodium (VOLTAREN) 1 % GEL, Apply 4 g topically 4 (four) times daily., Disp: 100 g, Rfl: 1 .  ELDERBERRY PO, Take by mouth., Disp: , Rfl:  .  EPINEPHrine (EPIPEN 2-PAK) 0.3 mg/0.3 mL IJ SOAJ injection, Inject 0.3 mLs (0.3 mg total) into the muscle as needed. With allergic reaction, Disp: 2 each, Rfl: 1 .  Fluticasone-Salmeterol (ADVAIR) 250-50 MCG/DOSE AEPB, Inhale 1 puff into the lungs 2 (two) times daily., Disp: 1 each, Rfl: 5 .  hydrOXYzine (VISTARIL) 25 MG capsule, Take 1 capsule (25 mg total) by mouth 3 (three) times daily as needed for anxiety., Disp: 30 capsule, Rfl: 0 .  ipratropium-albuterol (DUONEB) 0.5-2.5 (3) MG/3ML SOLN, Inhale 3 mLs into the lungs every 6 (six) hours as needed. Reported on 12/28/2015, Disp: 360 mL, Rfl: 3 .  methocarbamol (ROBAXIN) 500 MG tablet, Take 1 tablet (500 mg total) by mouth 2 (two) times daily as needed for muscle spasms., Disp: 60 tablet, Rfl: 3 .  mometasone (NASONEX) 50 MCG/ACT nasal spray, Place  2 sprays into the nose daily., Disp: 17 g, Rfl: 12 .  montelukast (SINGULAIR) 10 MG tablet, Take 1 tablet (10 mg total) by mouth at bedtime., Disp: 30 tablet, Rfl: 5 .  naproxen (NAPROSYN) 500 MG tablet, Take 1 tablet (500 mg total) by mouth 2 (two) times daily with a meal., Disp: 30 tablet, Rfl: 0 .  ondansetron (ZOFRAN-ODT) 4 MG disintegrating tablet, Take 1 tablet (4 mg total) by mouth every 8 (eight) hours as needed for nausea or vomiting., Disp: 20 tablet, Rfl: 0 .  oxyCODONE-acetaminophen (PERCOCET) 7.5-325 MG tablet, Take 1 tablet  by mouth every 6 (six) hours as needed for severe pain., Disp: 30 tablet, Rfl: 0 .  phentermine 37.5 MG capsule, Take 1 capsule (37.5 mg total) by mouth every morning. DO NOT TAKE THIS MEDICATION WHEN TAKING ZYRTEC-D, Disp: 30 capsule, Rfl: 3 .  promethazine (PHENERGAN) 25 MG tablet, Take 1 tablet (25 mg total) by mouth every 8 (eight) hours as needed for nausea., Disp: 20 tablet, Rfl: 0 .  sertraline (ZOLOFT) 100 MG tablet, Take 1 tablet (100 mg total) by mouth daily., Disp: 30 tablet, Rfl: 1 .  Spacer/Aero-Holding [place] ([place]) MISC, 1 each by Other route once. Always uses her when you're using a metered-dose inhaler. You've aromatase medicine as much, he won't have his much side effect, but you it twice as much medicine and your lungs., Disp: 1 each, Rfl: 0 .  triamcinolone cream (KENALOG) 0.5 %, APPLY 1 APPLICATION TOPICALLY 3 (THREE) TIMES DAILY., Disp: 45 g, Rfl: 2 .  ZYRTEC-D ALLERGY & CONGESTION 5-120 MG tablet, TAKE 1 TABLET BY MOUTH DAILY. AS NEEDED FOR ALLERGIES, Disp: 24 tablet, Rfl: 4  Review of Systems  Constitutional: Negative for appetite change, chills, fatigue and fever.  Respiratory: Positive for cough, shortness of breath and wheezing. Negative for chest tightness.   Cardiovascular: Negative for chest pain and palpitations.  Gastrointestinal: Negative for abdominal pain, nausea and vomiting.  Neurological: Positive for dizziness and headaches. Negative for weakness.    Social History   Tobacco Use  . Smoking status: Former Smoker    Packs/day: 0.30    Years: 25.00    Pack years: 7.50    Types: Cigarettes    Quit date: 07/16/2008    Years since quitting: 10.8  . Smokeless tobacco: Never Used  Substance Use Topics  . Alcohol use: Yes    Comment: occasional beer.      Objective:   BP 122/75 (BP Location: Right Arm, Patient Position: Sitting, Cuff Size: Large)   Pulse (!) 101   Temp (!) 96.6 F (35.9 C) (Temporal)   Wt 272 lb 3.2 oz (123.5 kg)    SpO2 99%   BMI 53.16 kg/m  Vitals:   05/28/19 1352  BP: 122/75  Pulse: (!) 101  Temp: (!) 96.6 F (35.9 C)  TempSrc: Temporal  SpO2: 99%  Weight: 272 lb 3.2 oz (123.5 kg)  Body mass index is 53.16 kg/m.   Physical Exam   General Appearance:    Obese female in no acute distress  Eyes:    PERRL, conjunctiva/corneas clear, EOM's intact       Lungs:     Occasional expiratory wheezes, no rales,, respirations unlabored  Heart:    Tachycardic. Normal rhythm. No murmurs, rubs, or gallops.   MS:   All extremities are intact.   Neurologic:   Awake, alert, oriented x 3. No apparent focal neurological  defect.           Assessment & Plan    1. Acute post-traumatic headache, not intractable Slowly improving since hitting head last week. No sign of bleeding on head CT which was done 2 days after injury. No relief from Fioricet but has taking a few oxycodone/apap which are refilled tody.  - oxyCODONE-acetaminophen (PERCOCET) 7.5-325 MG tablet; Take 1 tablet by mouth every 6 (six) hours as needed for severe pain.  Dispense: 30 tablet; Refill: 0  2. Nausea refill- promethazine (PHENERGAN) 25 MG tablet; Take 1 tablet (25 mg total) by mouth every 8 (eight) hours as needed for nausea.  Dispense: 20 tablet; Refill: 0  3. Asthma, chronic, moderate persistent, uncomplicated Start back on  - predniSONE (DELTASONE) 10 MG tablet; 6 tablets for 2 days, then 5 for 2 days, then 4 for 2 days, then 3 for 2 days, then 2 for 2 days, then 1 for 2 days.  Dispense: 42 tablet; Refill: 0 - budesonide-formoterol (SYMBICORT) 160-4.5 MCG/ACT inhaler; Inhale 2 puffs into the lungs 2 (two) times daily.  Dispense: 1 Inhaler; Refill: 12  Work excuse to return to work on 06-03-2019.   The entirety of the information documented in the History of Present Illness, Review of Systems and Physical Exam were personally obtained by me. Portions of this information were initially documented by Meyer Cory, CMA  and reviewed by me for thoroughness and accuracy.       Lelon Huh, MD  [facility]

## 2019-05-28 NOTE — Telephone Encounter (Signed)
Pt called and stated that she needs brand name of medication  albuterol (PROVENTIL HFA) 108 (90 Base) MCG/ACT inhaler [id number] and budesonide-formoterol (SYMBICORT) 160-4.5 MCG/ACT inhaler [id number]    Pt would like to know if any sample are available for pickup today. Please advise

## 2019-05-28 NOTE — Telephone Encounter (Signed)
From [facility] 

## 2019-05-28 NOTE — Telephone Encounter (Signed)
Brand name Symbicort was sent in earlier today. I resent albuterol prescription for Brand Name Proventil.

## 2019-05-28 NOTE — Telephone Encounter (Signed)
Requested medication (s) are due for refill today: yes  Requested medication (s) are on the active medication list:  Last refill:  N/a  Future visit scheduled:   Notes to clinic: Alternative reuested; PA  needed     Requested Prescriptions  Pending Prescriptions Disp Refills   Fluticasone-Salmeterol (ADVAIR) 250-50 MCG/DOSE AEPB [Pharmacy Med Name: FLUTICASONE-SALMETEROL 250-50]  0    Sig: Please specify directions, refills and quantity     Pulmonology:  Combination Products Passed - 05/28/2019  2:33 PM      Passed - Valid encounter within last 12 months    Recent Outpatient Visits          Today Nausea   [facility] Birdie Sons, MD   1 month ago [place], Donald E, MD   2 months ago DDD (degenerative disc disease), lumbar   [facility] Virginia Crews, MD   5 months ago Mild asthma with exacerbation, unspecified whether persistent   [facility] [place] Birdie Sons, MD   6 months ago Mild asthma with exacerbation, unspecified whether persistent   [facility] Birdie Sons, MD

## 2019-05-31 ENCOUNTER — Other Ambulatory Visit: Payer: Self-pay | Admitting: Family Medicine

## 2019-05-31 DIAGNOSIS — J4540 Moderate persistent asthma, uncomplicated: Secondary | ICD-10-CM

## 2019-05-31 MED ORDER — BUDESONIDE-FORMOTEROL FUMARATE 160-4.5 MCG/ACT IN AERO: 2.0000 | INHALATION_SPRAY | Freq: Two times a day (BID) | RESPIRATORY_TRACT | Status: DC

## 2019-06-05 ENCOUNTER — Emergency Department
Admission: EM | Admit: 2019-06-05 | Discharge: 2019-06-05 | Disposition: A | Discharge status: Home / Self Care | Payer: BC Managed Care – PPO | Attending: Emergency Medicine | Admitting: Emergency Medicine

## 2019-06-05 ENCOUNTER — Ambulatory Visit: Payer: Self-pay | Admitting: *Deleted

## 2019-06-05 ENCOUNTER — Other Ambulatory Visit: Payer: Self-pay

## 2019-06-05 DIAGNOSIS — Z91040 Latex allergy status: Secondary | ICD-10-CM | POA: Insufficient documentation

## 2019-06-05 DIAGNOSIS — R519 Headache, unspecified: Secondary | ICD-10-CM | POA: Diagnosis present

## 2019-06-05 DIAGNOSIS — S060X0D Concussion without loss of consciousness, subsequent encounter: Secondary | ICD-10-CM | POA: Insufficient documentation

## 2019-06-05 DIAGNOSIS — Z87891 Personal history of nicotine dependence: Secondary | ICD-10-CM | POA: Diagnosis not present

## 2019-06-05 DIAGNOSIS — Z79899 Other long term (current) drug therapy: Secondary | ICD-10-CM | POA: Diagnosis not present

## 2019-06-05 DIAGNOSIS — S060X0A Concussion without loss of consciousness, initial encounter: Secondary | ICD-10-CM

## 2019-06-05 DIAGNOSIS — W2203XD Walked into furniture, subsequent encounter: Secondary | ICD-10-CM | POA: Insufficient documentation

## 2019-06-05 NOTE — ED Notes (Signed)
Headache after a concussion on the 19th. Pt ambulatory to room.

## 2019-06-05 NOTE — Discharge Instructions (Addendum)
You have a concussion.  You can continue taking the Fioricet that you were prescribed last time.  Please call Dr. Maralyn Sago office tomorrow for an appointment as soon as possible.

## 2019-06-05 NOTE — Telephone Encounter (Signed)
Head injury from fall- was seen in office- patient is not better.Patient was seen at ED - cleared after the fall. Patient was seen at PCP- she was given medication for nausea and head pain.Patient reports she is worse and not better. She is having pain - using ice and pain medication and she is having vision problems.  Reason for Disposition . Concussion symptoms are worsening  Answer Assessment - Initial Assessment Questions 1. MECHANISM: "How did the injury happen?" For falls, ask: "What height did you fall from?" and "What surface did you fall against?"      Patient fell out of chair- hit head on chair and [place] cabnet 2. ONSET: "When did the injury happen?" (Minutes or hours ago)      11/19 3. NEUROLOGIC SYMPTOMS: "Was there any loss of consciousness?" "Are there any other neurological symptoms?"      Patient did not loose consciousness- saw dots.  4. MENTAL STATUS: "Does the person know who he is, who you are, and where he is?"      Patient is having issues with memory, heat in head 5. LOCATION: "What part of the head was hit?"      Back of head 6. SCALP APPEARANCE: "What does the scalp look like? Is it bleeding now?" If so, ask: "Is it difficult to stop?"      No cuts- soreness 7. SIZE: For cuts, bruises, or swelling, ask: "How large is it?" (e.g., inches or centimeters)      No knot 8. PAIN: "Is there any pain?" If so, ask: "How bad is it?"  (e.g., Scale 1-10; or mild, moderate, severe)     Patient has pain in head and eye strain - severe 9. TETANUS: For any breaks in the skin, ask: "When was the last tetanus booster?"     n/a 10. OTHER SYMPTOMS: "Do you have any other symptoms?" (e.g., neck pain, vomiting)       No neck pain, some nausea 11. PREGNANCY: "Is there any chance you are pregnant?" "When was your last menstrual period?"       n/a  Protocols used: CONCUSSION (MTBI) LESS THAN 14 DAYS AGO FOLLOW-UP CALL-A-AH, HEAD INJURY-A-AH

## 2019-06-05 NOTE — Telephone Encounter (Signed)
FYI: Patient at ER now for evaluation.

## 2019-06-05 NOTE — ED Provider Notes (Signed)
[facility] Emergency Department Provider Note  ____________________________________________  Time seen: Approximately 4:06 PM  I have reviewed the triage vital signs and the nursing notes.   HISTORY  Chief Complaint Headache    HPI Erin Sherman is a 53 y.o. female that presents to emergency department for evaluation of all-around headache intermittent since injury on November 19.  Patient hit her head on the [place] cabinet.  She is unsure if she had lost consciousness.  She was evaluated in the emergency department 2 days after incident for a continuous headache.  She had a head CT at that time which was normal.  Headaches were intermittent last week following her ER visit.  She only took 1 dose of the Fioricet that she was prescribed because she did not want to interact with her other medications.  She returned to work on Monday and headaches have been worsening since.  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  She does not usually have a headache when she gets to work but headache worsens throughout the day.  She followed up with primary care last week.  No new trauma.   Past Medical History:  Diagnosis Date  . Cervical cancer ([place])   . Hx of bronchitis   . MI (myocardial infarction) ([place])   . Migraines   . Stomach ulcer   . Stroke [facility])     Patient Active Problem List   Diagnosis Date Noted  . Hyperplastic polyp of sigmoid colon 11/29/2016  . Polyp of sigmoid colon   . Itching 11/17/2015  . Trochanteric bursitis of right hip 07/01/2015  . Low back pain 07/01/2015  . DDD (degenerative disc disease), lumbar 05/15/2015  . Carpal tunnel syndrome 05/07/2015  . Hip pain 05/07/2015  . History of cervical cancer 05/07/2015  . HLD (hyperlipidemia) 05/07/2015  . Menopausal symptom 05/07/2015  . Obesity 05/07/2015  . 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 Urinary incontinence 05/07/2015  . Nausea 05/07/2015  . Asthma, chronic 07/16/2013   . Allergic rhinitis 07/16/2013  . GERD (gastroesophageal reflux disease) 07/16/2013    Past Surgical History:  Procedure Laterality Date  . ABDOMINAL HYSTERECTOMY     with unilateral oophorectomy and part of cervix removed due to cervical cancer  . CARPAL TUNNEL RELEASE Right 2013  . CARPAL TUNNEL RELEASE  03/02/2010   Endoscopic carpal tunnel release. Rica Mast, MD (Orthopedic, [facility])  . CESAREAN SECTION    . COLONOSCOPY WITH PROPOFOL N/A 11/29/2016   Procedure: COLONOSCOPY WITH PROPOFOL;  Surgeon: [name], MD;  Location: [facility] ENDOSCOPY;  Service: Endoscopy;  Laterality: N/A;  . exercise treadmill Test  05/31/2006   Normal  . TUBAL LIGATION      Prior to Admission medications   Medication Sig Start Date End Date Taking? Authorizing Provider  budesonide-formoterol (SYMBICORT) 160-4.5 MCG/ACT inhaler Inhale 2 puffs into the lungs 2 (two) times daily. 05/31/19   Birdie Sons, MD  calcium carbonate (TUMS) 500 MG chewable tablet Chew 1 tablet by mouth daily.    [provider]  cholecalciferol (VITAMIN D) 1000 UNITS tablet Take 1,000 Units by mouth daily.    [provider]  cyclobenzaprine (FLEXERIL) 5 MG tablet Take 1-2 tablets (5-10 mg total) by mouth at bedtime. 04/19/19   Birdie Sons, MD  diclofenac (FLECTOR) 1.3 % PTCH Place 1 patch onto the skin 2 (two) times daily as needed. 04/11/18   Birdie Sons, MD  diclofenac sodium (VOLTAREN) 1 % GEL  Apply 4 g topically 4 (four) times daily. 09/25/18   Birdie Sons, MD  ELDERBERRY PO Take by mouth.    [provider]  EPINEPHrine (EPIPEN 2-PAK) 0.3 mg/0.3 mL IJ SOAJ injection Inject 0.3 mLs (0.3 mg total) into the muscle as needed. With allergic reaction 01/10/19   Trinna Post, PA-C  hydrOXYzine (VISTARIL) 25 MG capsule Take 1 capsule (25 mg total) by mouth 3 (three) times daily as needed for anxiety. 04/19/19   Birdie Sons, MD  ipratropium-albuterol (DUONEB) 0.5-2.5 (3) MG/3ML SOLN  Inhale 3 mLs into the lungs every 6 (six) hours as needed. Reported on 12/28/2015 12/28/15   Mar Daring, PA-C  methocarbamol (ROBAXIN) 500 MG tablet Take 1 tablet (500 mg total) by mouth 2 (two) times daily as needed for muscle spasms. 09/25/18   Birdie Sons, MD  mometasone (NASONEX) 50 MCG/ACT nasal spray Place 2 sprays into the nose daily. 12/07/18 12/07/19  Birdie Sons, MD  montelukast (SINGULAIR) 10 MG tablet Take 1 tablet (10 mg total) by mouth at bedtime. 10/09/17   Birdie Sons, MD  naproxen (NAPROSYN) 500 MG tablet Take 1 tablet (500 mg total) by mouth 2 (two) times daily with a meal. 01/24/19   Birdie Sons, MD  oxyCODONE-acetaminophen (PERCOCET) 7.5-325 MG tablet Take 1 tablet by mouth every 6 (six) hours as needed for severe pain. 05/28/19   Birdie Sons, MD  phentermine 37.5 MG capsule Take 1 capsule (37.5 mg total) by mouth every morning. DO NOT TAKE THIS MEDICATION WHEN TAKING ZYRTEC-D 02/07/19   Birdie Sons, MD  predniSONE (DELTASONE) 10 MG tablet 6 tablets for 2 days, then 5 for 2 days, then 4 for 2 days, then 3 for 2 days, then 2 for 2 days, then 1 for 2 days. 05/28/19 06/09/19  Birdie Sons, MD  promethazine (PHENERGAN) 25 MG tablet Take 1 tablet (25 mg total) by mouth every 8 (eight) hours as needed for nausea. 05/28/19   Birdie Sons, MD  PROVENTIL HFA 108 [phone] Base) MCG/ACT inhaler Inhale 2 puffs into the lungs every 6 (six) hours as needed. Brand Name Proventil medically necessary 05/28/19   Birdie Sons, MD  sertraline (ZOLOFT) 100 MG tablet Take 1 tablet (100 mg total) by mouth daily. 05/12/19   Birdie Sons, MD  Spacer/Aero-Holding [facility] ADVANTAGE) MISC 1 each by Other route once. Always uses her when you're using a metered-dose inhaler. You've aromatase medicine as much, he won't have his much side effect, but you it twice as much medicine and your lungs. 05/24/15   Ahmed Prima, MD  triamcinolone cream (KENALOG) 0.5 %  APPLY 1 APPLICATION TOPICALLY 3 (THREE) TIMES DAILY. 01/03/19   Birdie Sons, MD  ZYRTEC-D ALLERGY & CONGESTION 5-120 MG tablet TAKE 1 TABLET BY MOUTH DAILY. AS NEEDED FOR ALLERGIES 01/23/19   Birdie Sons, MD    Allergies Latex, Sulfa antibiotics, and Meperidine  Family History  Problem Relation Age of Onset  . Emphysema Mother   . Asthma Mother   . Diabetes Mother        type 2  . COPD Mother   . Hypertension Mother   . Emphysema Father   . Hypertension Father   . COPD Father   . Colon cancer Father        died of colon cancer August 15, 2017  . Asthma Sister   . Asthma Brother   . Heart attack Brother   .  Heart attack Maternal Grandmother   . Heart attack Paternal Grandmother   . Anemia Sister   . Asthma Son   . Cancer Maternal Grandfather        prostate  . Cancer Maternal Aunt        breast  . Cancer Other     Social History Social History   Tobacco Use  . Smoking status: Former Smoker    Packs/day: 0.30    Years: 25.00    Pack years: 7.50    Types: Cigarettes    Quit date: 07/16/2008    Years since quitting: 10.8  . Smokeless tobacco: Never Used  Substance Use Topics  . Alcohol use: Yes    Comment: occasional beer.  . Drug use: No     Review of Systems  Constitutional: No fever/chills Cardiovascular: No chest pain. Respiratory: No SOB. Gastrointestinal: No abdominal pain.  No nausea, no vomiting.  Musculoskeletal: Negative for musculoskeletal pain. Skin: Negative for rash, abrasions, lacerations, ecchymosis. Neurological: Negative for numbness or tingling. No weakness. Positive for headache.   ____________________________________________   PHYSICAL EXAM:  VITAL SIGNS: ED Triage Vitals  Enc Vitals Group     BP 06/05/19 1544 (!) 152/77     Pulse Rate 06/05/19 1544 93     Resp 06/05/19 1544 20     Temp 06/05/19 1544 99 F (37.2 C)     Temp Source 06/05/19 1544 Oral     SpO2 06/05/19 1544 97 %     Weight 06/05/19 1544 272 lb (123.4 kg)      Height 06/05/19 1544 5' (1.524 m)     Head Circumference --      Peak Flow --      Pain Score 06/05/19 1548 10     Pain Loc --      Pain Edu? --      Excl. in [place]? --      Constitutional: Alert and oriented. Well appearing and in no acute distress. Eyes: Conjunctivae are normal. PERRL. EOMI. Head: Atraumatic. ENT:      Ears:      Nose: No congestion/rhinnorhea.      Mouth/Throat: Mucous membranes are moist.  Neck: No stridor. Cardiovascular: Normal rate, regular rhythm.  Good peripheral circulation. Respiratory: Normal respiratory effort without tachypnea or retractions. Lungs CTAB. Good air entry to the bases with no decreased or absent breath sounds. Musculoskeletal: Full range of motion to all extremities. No gross deformities appreciated. Neurologic: Normal speech and language. No gross focal neurologic deficits are appreciated.  Cranial nerves: 2-10 normal as tested. Strength 5/5 in upper and lower extremities Cerebellar: Finger-nose-finger WNL, Heel to shin WNL Sensorimotor: No pronator drift, clonus, sensory loss or abnormal reflexes. No vision deficits noted to confrontation bilaterally.  Speech: No dysarthria or expressive aphasia Skin:  Skin is warm, dry and intact. No rash noted. Psychiatric: Mood and affect are normal. Speech and behavior are normal. Patient exhibits appropriate insight and judgement.   ____________________________________________   LABS (all labs ordered are listed, but only abnormal results are displayed)  Labs Reviewed - No data to display ____________________________________________  EKG   ____________________________________________  RADIOLOGY   No results found.  ____________________________________________    PROCEDURES  Procedure(s) performed:    Procedures    Medications - No data to display   ____________________________________________   INITIAL IMPRESSION / ASSESSMENT AND PLAN / ED COURSE  Pertinent labs &  imaging results that were available during my care of the patient were reviewed by me  and considered in my medical decision making (see chart for details).  Review of the [place] CSRS was performed in accordance of the [place] prior to dispensing any controlled drugs.   Patient's diagnosis is consistent with concussion.  Vital signs and exam are reassuring.  Patient had a head CT on 11/21, 2 days following her head injury.  Head CT did not show any acute intracranial abnormality.  She has not had any additional trauma since.  Headaches are likely worsening since her return to work and use of computer for a full workday.  Her neuro exam is within normal limits.  Patient will take the Fioricet that she was prescribed previously.  Patient will follow up with primary care.  Patient is given ED precautions to return to the ED for any worsening or new symptoms.  Erin Sherman was evaluated in Emergency Department on 06/05/2019 for the symptoms described in the history of present illness. She was evaluated in the context of the global COVID-19 pandemic, which necessitated consideration that the patient might be at risk for infection with the SARS-CoV-2 virus that causes COVID-19. Institutional protocols and algorithms that pertain to the evaluation of patients at risk for COVID-19 are in a state of rapid change based on information released by regulatory bodies including the CDC and federal and state organizations. These policies and algorithms were followed during the patient's care in the ED.   ____________________________________________  FINAL CLINICAL IMPRESSION(S) / ED DIAGNOSES  Final diagnoses:  Concussion without loss of consciousness, initial encounter      NEW MEDICATIONS STARTED DURING THIS VISIT:  ED Discharge Orders    None          This chart was dictated using voice recognition software/Dragon. Despite best efforts to proofread, errors can occur which can change the meaning. Any  change was purely unintentional.    Laban Emperor, PA-C 06/05/19 1820    Arta Silence, MD 06/05/19 [phone]

## 2019-06-05 NOTE — ED Triage Notes (Addendum)
Pt comes via POV with c/o headache and nausea. Pt states she was dx with concussion recently the patient states the medication is not helping . Pt states she had CT completed following the incident and it was negative.  Pt states yesterday she hit her head when she bent down.  VSS. Pt has hx of migraines.

## 2019-06-06 ENCOUNTER — Telehealth: Payer: Self-pay

## 2019-06-06 NOTE — Telephone Encounter (Signed)
Received FMLA forms via fax from [employer]. Forms placed in Dr. Maralyn Sago box. Thanks TNP

## 2019-06-10 ENCOUNTER — Telehealth: Payer: Self-pay

## 2019-06-10 NOTE — Telephone Encounter (Signed)
Patient call was returned and she made appointment to come in at 8:40 AM tomorrow 06/11/2019.

## 2019-06-10 NOTE — Telephone Encounter (Signed)
Patient is requesting the FMLA forms be completed for her concussion. She also has a appointment scheduled for 12/8 for a follow up.

## 2019-06-10 NOTE — Telephone Encounter (Signed)
Copied from [place] [id number]. Topic: Appointment Scheduling - Scheduling Inquiry for Clinic >> Jun 10, 2019  7:25 AM Scherrie Gerlach wrote: Reason for CRM: pt would like appt asap w/ Dr Caryn Section.  Pt still having issues with her concussion.  She has seen him for this. Pt states she is trying to rest, but needs some assistance. Please advise

## 2019-06-11 ENCOUNTER — Encounter: Payer: Self-pay | Admitting: Family Medicine

## 2019-06-11 ENCOUNTER — Ambulatory Visit: Payer: BC Managed Care – PPO | Admitting: Family Medicine

## 2019-06-11 ENCOUNTER — Other Ambulatory Visit: Payer: Self-pay

## 2019-06-11 VITALS — BP 120/70 | HR 100 | Temp 96.9°F | Resp 16 | Wt 274.0 lb

## 2019-06-11 DIAGNOSIS — H60501 Unspecified acute noninfective otitis externa, right ear: Secondary | ICD-10-CM | POA: Diagnosis not present

## 2019-06-11 DIAGNOSIS — S060X0D Concussion without loss of consciousness, subsequent encounter: Secondary | ICD-10-CM

## 2019-06-11 DIAGNOSIS — R519 Headache, unspecified: Secondary | ICD-10-CM | POA: Diagnosis not present

## 2019-06-11 DIAGNOSIS — L282 Other prurigo: Secondary | ICD-10-CM | POA: Diagnosis not present

## 2019-06-11 MED ORDER — TOPIRAMATE 50 MG PO TABS: ORAL_TABLET | ORAL | 0 refills | Status: DC

## 2019-06-11 MED ORDER — NEOMYCIN-POLYMYXIN-HC 3.5-10000-1 OT SOLN: 3.0000 [drp] | Freq: Four times a day (QID) | OTIC | 0 refills | Status: AC

## 2019-06-11 MED ORDER — TRIAMCINOLONE ACETONIDE 0.5 % EX CREA: 1.0000 "application " | TOPICAL_CREAM | Freq: Three times a day (TID) | CUTANEOUS | 2 refills | Status: DC

## 2019-06-11 NOTE — Progress Notes (Signed)
Patient: Erin Sherman Female    DOB: [birth year]   53 y.o.   MRN: [id number] Visit Date: 06/11/2019  Today's Provider: Lelon Huh, MD   Chief Complaint  Patient presents with  . Follow-up   Subjective:     HPI   .Follow up ER visit  Patient was seen in ER for headache on 06/05/2019. She was treated for concussion from fall and hit an the head that occurred on 05/25/2019. Treatment for this included patient advised to take Fioricet that was prescribed and follow up with PCP. She reports good compliance with treatment. She reports this condition is Unchanged. Patient still having headaches and dizziness. She reports that bright lights aggravate symptoms. Feels dizzy and lightheaded especially when leaning over. No numbness or tingling.  ------------------------------------------------------------------------------------    Allergies  Allergen Reactions  . Latex     swelling  . Sulfa Antibiotics     Hives, itching  . Meperidine Rash     Current Outpatient Medications:  .  budesonide-formoterol (SYMBICORT) 160-4.5 MCG/ACT inhaler, Inhale 2 puffs into the lungs 2 (two) times daily., Disp: , Rfl:  .  calcium carbonate (TUMS) 500 MG chewable tablet, Chew 1 tablet by mouth daily., Disp: , Rfl:  .  cholecalciferol (VITAMIN D) 1000 UNITS tablet, Take 1,000 Units by mouth daily., Disp: , Rfl:  .  cyclobenzaprine (FLEXERIL) 5 MG tablet, Take 1-2 tablets (5-10 mg total) by mouth at bedtime., Disp: 60 tablet, Rfl: 2 .  diclofenac (FLECTOR) 1.3 % PTCH, Place 1 patch onto the skin 2 (two) times daily as needed., Disp: 60 patch, Rfl: 5 .  diclofenac sodium (VOLTAREN) 1 % GEL, Apply 4 g topically 4 (four) times daily., Disp: 100 g, Rfl: 1 .  ELDERBERRY PO, Take by mouth., Disp: , Rfl:  .  EPINEPHrine (EPIPEN 2-PAK) 0.3 mg/0.3 mL IJ SOAJ injection, Inject 0.3 mLs (0.3 mg total) into the muscle as needed. With allergic reaction, Disp: 2 each, Rfl: 1 .  hydrOXYzine (VISTARIL)  25 MG capsule, Take 1 capsule (25 mg total) by mouth 3 (three) times daily as needed for anxiety., Disp: 30 capsule, Rfl: 0 .  ipratropium-albuterol (DUONEB) 0.5-2.5 (3) MG/3ML SOLN, Inhale 3 mLs into the lungs every 6 (six) hours as needed. Reported on 12/28/2015, Disp: 360 mL, Rfl: 3 .  methocarbamol (ROBAXIN) 500 MG tablet, Take 1 tablet (500 mg total) by mouth 2 (two) times daily as needed for muscle spasms., Disp: 60 tablet, Rfl: 3 .  mometasone (NASONEX) 50 MCG/ACT nasal spray, Place 2 sprays into the nose daily., Disp: 17 g, Rfl: 12 .  montelukast (SINGULAIR) 10 MG tablet, Take 1 tablet (10 mg total) by mouth at bedtime., Disp: 30 tablet, Rfl: 5 .  naproxen (NAPROSYN) 500 MG tablet, Take 1 tablet (500 mg total) by mouth 2 (two) times daily with a meal., Disp: 30 tablet, Rfl: 0 .  oxyCODONE-acetaminophen (PERCOCET) 7.5-325 MG tablet, Take 1 tablet by mouth every 6 (six) hours as needed for severe pain., Disp: 30 tablet, Rfl: 0 .  phentermine 37.5 MG capsule, Take 1 capsule (37.5 mg total) by mouth every morning. DO NOT TAKE THIS MEDICATION WHEN TAKING ZYRTEC-D, Disp: 30 capsule, Rfl: 3 .  promethazine (PHENERGAN) 25 MG tablet, Take 1 tablet (25 mg total) by mouth every 8 (eight) hours as needed for nausea., Disp: 20 tablet, Rfl: 0 .  PROVENTIL HFA 108 (90 Base) MCG/ACT inhaler, Inhale 2 puffs into the lungs every 6 (six) hours as  needed. Brand Name Proventil medically necessary, Disp: 18 g, Rfl: 6 .  sertraline (ZOLOFT) 100 MG tablet, Take 1 tablet (100 mg total) by mouth daily., Disp: 30 tablet, Rfl: 1 .  Spacer/Aero-Holding [place] ([place]) MISC, 1 each by Other route once. Always uses her when you're using a metered-dose inhaler. You've aromatase medicine as much, he won't have his much side effect, but you it twice as much medicine and your lungs., Disp: 1 each, Rfl: 0 .  triamcinolone cream (KENALOG) 0.5 %, APPLY 1 APPLICATION TOPICALLY 3 (THREE) TIMES DAILY., Disp: 45 g, Rfl: 2  .  ZYRTEC-D ALLERGY & CONGESTION 5-120 MG tablet, TAKE 1 TABLET BY MOUTH DAILY. AS NEEDED FOR ALLERGIES, Disp: 24 tablet, Rfl: 4  Review of Systems  Constitutional: Negative.   HENT:       Right ear stopped up  Eyes: Positive for photophobia. Negative for visual disturbance.  Respiratory: Negative.   Cardiovascular: Negative.   Musculoskeletal: Negative.   Neurological: Positive for dizziness and headaches.    Social History   Tobacco Use  . Smoking status: Former Smoker    Packs/day: 0.30    Years: 25.00    Pack years: 7.50    Types: Cigarettes    Quit date: 07/16/2008    Years since quitting: 10.9  . Smokeless tobacco: Never Used  Substance Use Topics  . Alcohol use: Yes    Comment: occasional beer.      Objective:   BP 120/70 (BP Location: Left Arm, Patient Position: Sitting, Cuff Size: Large)   Pulse 100   Temp (!) 96.9 F (36.1 C) (Temporal)   Resp 16   Wt 274 lb (124.3 kg)   SpO2 97% Comment: room air  BMI 53.51 kg/m  Vitals:   06/11/19 0846  BP: 120/70  Pulse: 100  Resp: 16  Temp: (!) 96.9 F (36.1 C)  TempSrc: Temporal  SpO2: 97%  Weight: 274 lb (124.3 kg)  Body mass index is 53.51 kg/m.   Physical Exam   General Appearance:    Obese female in no acute distress  Eyes:    PERRL, conjunctiva/corneas clear, EOM's intact       Lungs:     Clear to auscultation bilaterally, respirations unlabored  Heart:    Tachycardic. Normal rhythm. No murmurs, rubs, or gallops.   MS:   All extremities are intact.   Neurologic:   Awake, alert, oriented x 3. No apparent focal neurological           defect.   HENT:   Right ear canal mildly inflamed, no discharge.       Assessment & Plan    1. Papular urticaria  - triamcinolone cream (KENALOG) 0.5 %; Apply 1 application topically 3 (three) times daily.  Dispense: 45 g; Refill: 2  2. Concussion without loss of consciousness, subsequent encounter Persistent sx 2 weeks after injury. Will excuse from work through  the end of next week then reassess  3. Intractable headache, unspecified chronicity pattern, unspecified headache type try- topiramate (TOPAMAX) 50 MG tablet; 1/2 tablet every night for 2 days, then increase to 1 tablet every night for 7 days, then 1 tablet twice daily  Dispense: 60 tablet; Refill: 0  4. Acute otitis externa of right ear, unspecified type  - neomycin-polymyxin-hydrocortisone (CORTISPORIN) OTIC solution; Place 3 drops into the right ear 4 (four) times daily for 7 days.  Dispense: 10 mL; Refill: 0     Lelon Huh, MD  [facility]

## 2019-06-12 NOTE — Telephone Encounter (Signed)
Completed 06-11-2019 and sent to Medical Records to fax.

## 2019-06-13 ENCOUNTER — Other Ambulatory Visit: Payer: Self-pay | Admitting: Family Medicine

## 2019-06-13 DIAGNOSIS — J4540 Moderate persistent asthma, uncomplicated: Secondary | ICD-10-CM

## 2019-06-13 NOTE — Telephone Encounter (Signed)
Requested medication (s) are due for refill today: yes  Requested medication (s) are on the active medication list:yes  Last refill:  06/13/2019  Future visit scheduled: yes  Notes to clinic: Alternative Requested:BRAND PROVENTIL NOT COVERED BY INSURANCE. PLEASE CALL INSURANCE OR RE ISSUE RX WITHOUT DAW 1.   Requested Prescriptions  Pending Prescriptions Disp Refills   levalbuterol (XOPENEX HFA) 45 MCG/ACT inhaler [Pharmacy Med Name: LEVALBUTEROL TAR HFA 45MCG INH] 1 Inhaler 0    Sig: Please specify directions, refills and quantity      Pulmonology:  Beta Agonists Failed - 06/13/2019  9:12 AM      Failed - One inhaler should last at least one month. If the patient is requesting refills earlier, contact the patient to check for uncontrolled symptoms.      Passed - Valid encounter within last 12 months    Recent Outpatient Visits           2 days ago Concussion without loss of consciousness, subsequent encounter   [facility] Birdie Sons, MD   2 weeks ago Acute post-traumatic headache, not intractable   [facility] Birdie Sons, MD   1 month ago [employer], Donald E, MD   2 months ago DDD (degenerative disc disease), lumbar   [facility] Virginia Crews, MD   6 months ago Mild asthma with exacerbation, unspecified whether persistent   [place], MD       Future Appointments             In 1 week Fisher, Kirstie Peri, MD [facility], [place]

## 2019-06-16 ENCOUNTER — Other Ambulatory Visit: Payer: Self-pay | Admitting: Family Medicine

## 2019-06-16 DIAGNOSIS — F419 Anxiety disorder, unspecified: Secondary | ICD-10-CM

## 2019-06-17 NOTE — Telephone Encounter (Signed)
Pt stated that they are still waiting for paperwork with her dates out of work to be sent in. From the end of November through December. Please advise. Pt needs the paperwork to cover her while being out.

## 2019-06-17 NOTE — Telephone Encounter (Signed)
From [facility] 

## 2019-06-17 NOTE — Telephone Encounter (Signed)
Erin Kelp, do you still have the paperwork that was faxed?

## 2019-06-17 NOTE — Telephone Encounter (Signed)
Forms were faxed and pt was advised. Thanks TNP

## 2019-06-21 ENCOUNTER — Ambulatory Visit: Payer: BC Managed Care – PPO | Admitting: Family Medicine

## 2019-06-21 ENCOUNTER — Other Ambulatory Visit: Payer: Self-pay

## 2019-06-21 ENCOUNTER — Encounter: Payer: Self-pay | Admitting: Family Medicine

## 2019-06-21 VITALS — BP 125/81 | HR 120 | Temp 97.6°F | Resp 16 | Wt 274.6 lb

## 2019-06-21 DIAGNOSIS — S0990XD Unspecified injury of head, subsequent encounter: Secondary | ICD-10-CM

## 2019-06-21 DIAGNOSIS — R42 Dizziness and giddiness: Secondary | ICD-10-CM | POA: Diagnosis not present

## 2019-06-21 DIAGNOSIS — R410 Disorientation, unspecified: Secondary | ICD-10-CM

## 2019-06-21 DIAGNOSIS — G4489 Other headache syndrome: Secondary | ICD-10-CM

## 2019-06-21 DIAGNOSIS — H539 Unspecified visual disturbance: Secondary | ICD-10-CM | POA: Diagnosis not present

## 2019-06-21 MED ORDER — DIAZEPAM 10 MG PO TABS: ORAL_TABLET | ORAL | 0 refills | Status: DC

## 2019-06-21 NOTE — Progress Notes (Signed)
Patient: Erin Sherman Female    DOB: 20-Jan-1966   53 y.o.   MRN: [id number] Visit Date: 06/21/2019  Today's Provider: Lelon Huh, MD   Chief Complaint  Patient presents with  . Follow-up   Subjective:     HPI  Follow up for concussion:  The patient was last seen for this 10 days ago. Changes made at last visit include none.  She reports excellent compliance with treatment. She feels that condition is Unchanged. Patient reports she is still having pain, headache, lightheadedness, and dizziness.  She is not having side effects.   She states she continues to have persistent dizziness and spinning sensation when she leans over and turns her head quickly. She is having episodes of confusion, headache is always on the right side and associated with blurry vision.  ------------------------------------------------------------------------------------  Allergies  Allergen Reactions  . Latex     swelling  . Sulfa Antibiotics     Hives, itching  . Meperidine Rash     Current Outpatient Medications:  .  budesonide-formoterol (SYMBICORT) 160-4.5 MCG/ACT inhaler, Inhale 2 puffs into the lungs 2 (two) times daily., Disp: , Rfl:  .  calcium carbonate (TUMS) 500 MG chewable tablet, Chew 1 tablet by mouth daily., Disp: , Rfl:  .  cholecalciferol (VITAMIN D) 1000 UNITS tablet, Take 1,000 Units by mouth daily., Disp: , Rfl:  .  cyclobenzaprine (FLEXERIL) 5 MG tablet, Take 1-2 tablets (5-10 mg total) by mouth at bedtime., Disp: 60 tablet, Rfl: 2 .  diclofenac (FLECTOR) 1.3 % PTCH, Place 1 patch onto the skin 2 (two) times daily as needed., Disp: 60 patch, Rfl: 5 .  diclofenac sodium (VOLTAREN) 1 % GEL, Apply 4 g topically 4 (four) times daily., Disp: 100 g, Rfl: 1 .  ELDERBERRY PO, Take by mouth., Disp: , Rfl:  .  EPINEPHrine (EPIPEN 2-PAK) 0.3 mg/0.3 mL IJ SOAJ injection, Inject 0.3 mLs (0.3 mg total) into the muscle as needed. With allergic reaction, Disp: 2 each, Rfl:  1 .  hydrOXYzine (VISTARIL) 25 MG capsule, Take 1 capsule (25 mg total) by mouth 3 (three) times daily as needed for anxiety., Disp: 30 capsule, Rfl: 0 .  ipratropium-albuterol (DUONEB) 0.5-2.5 (3) MG/3ML SOLN, Inhale 3 mLs into the lungs every 6 (six) hours as needed. Reported on 12/28/2015, Disp: 360 mL, Rfl: 3 .  levalbuterol (XOPENEX HFA) 45 MCG/ACT inhaler, Inhale 2 puffs into the lungs every 6 (six) hours as needed for wheezing. May substitute generic, Disp: 1 Inhaler, Rfl: 12 .  methocarbamol (ROBAXIN) 500 MG tablet, Take 1 tablet (500 mg total) by mouth 2 (two) times daily as needed for muscle spasms., Disp: 60 tablet, Rfl: 3 .  mometasone (NASONEX) 50 MCG/ACT nasal spray, Place 2 sprays into the nose daily., Disp: 17 g, Rfl: 12 .  montelukast (SINGULAIR) 10 MG tablet, Take 1 tablet (10 mg total) by mouth at bedtime., Disp: 30 tablet, Rfl: 5 .  naproxen (NAPROSYN) 500 MG tablet, Take 1 tablet (500 mg total) by mouth 2 (two) times daily with a meal., Disp: 30 tablet, Rfl: 0 .  oxyCODONE-acetaminophen (PERCOCET) 7.5-325 MG tablet, Take 1 tablet by mouth every 6 (six) hours as needed for severe pain., Disp: 30 tablet, Rfl: 0 .  phentermine 37.5 MG capsule, Take 1 capsule (37.5 mg total) by mouth every morning. DO NOT TAKE THIS MEDICATION WHEN TAKING ZYRTEC-D, Disp: 30 capsule, Rfl: 3 .  promethazine (PHENERGAN) 25 MG tablet, Take 1 tablet (25 mg  total) by mouth every 8 (eight) hours as needed for nausea., Disp: 20 tablet, Rfl: 0 .  sertraline (ZOLOFT) 100 MG tablet, TAKE 1 TABLET BY MOUTH EVERY DAY, Disp: 90 tablet, Rfl: 4 .  Spacer/Aero-Holding Chambers (OPTICHAMBER ADVANTAGE) MISC, 1 each by Other route once. Always uses her when you're using a metered-dose inhaler. You've aromatase medicine as much, he won't have his much side effect, but you it twice as much medicine and your lungs., Disp: 1 each, Rfl: 0 .  topiramate (TOPAMAX) 50 MG tablet, 1/2 tablet every night for 2 days, then increase to 1  tablet every night for 7 days, then 1 tablet twice daily, Disp: 60 tablet, Rfl: 0 .  triamcinolone cream (KENALOG) 0.5 %, Apply 1 application topically 3 (three) times daily., Disp: 45 g, Rfl: 2 .  ZYRTEC-D ALLERGY & CONGESTION 5-120 MG tablet, TAKE 1 TABLET BY MOUTH DAILY. AS NEEDED FOR ALLERGIES, Disp: 24 tablet, Rfl: 4  Review of Systems  Constitutional: Negative for appetite change, chills, fatigue and fever.  Respiratory: Negative for chest tightness and shortness of breath.   Cardiovascular: Negative for chest pain and palpitations.  Gastrointestinal: Positive for nausea. Negative for abdominal pain and vomiting.  Neurological: Positive for dizziness, light-headedness and headaches. Negative for weakness.    Social History   Tobacco Use  . Smoking status: Former Smoker    Packs/day: 0.30    Years: 25.00    Pack years: 7.50    Types: Cigarettes    Quit date: 07/16/2008    Years since quitting: 10.9  . Smokeless tobacco: Never Used  Substance Use Topics  . Alcohol use: Yes    Comment: occasional beer.      Objective:   BP 125/81 (BP Location: Right Arm, Patient Position: Sitting, Cuff Size: Large)   Pulse (!) 120   Temp 97.6 F (36.4 C) (Temporal)   Resp 16   Wt 274 lb 9.6 oz (124.6 kg)   BMI 53.63 kg/m  Vitals:   06/21/19 1102  BP: 125/81  Pulse: (!) 120  Resp: 16  Temp: 97.6 F (36.4 C)  TempSrc: Temporal  Weight: 274 lb 9.6 oz (124.6 kg)  Body mass index is 53.63 kg/m.   Physical Exam   General Appearance:    Obese female in no acute distress  Eyes:    PERRL, conjunctiva/corneas clear, EOM's intact       Lungs:     Clear to auscultation bilaterally, respirations unlabored  Heart:    Tachycardic. Normal rhythm. No murmurs, rubs, or gallops.   MS:   All extremities are intact.   Neurologic:   Awake, alert, oriented x 3. No apparent focal neurological           defect.            Assessment & Plan    1. Other headache syndrome Is now one month  out from close injury with persistent headaches and neurological sx. No improvement with initiation of topiramate. Remains unable to work. Work excused extended through the remainder of December while undergoing continuing evaluation  - MR Brain W Wo Contrast; Future - diazepam (VALIUM) 10 MG tablet; Take one tablet 30-60 minutes prior to procedure  Dispense: 1 tablet; Refill: 0  2. Vision changes  - MR Brain W Wo Contrast; Future  3. Dizziness  - MR Brain W Wo Contrast; Future  4. Closed head injury, subsequent encounter  - MR Brain W Wo Contrast; Future  5. Confusion  - MR  Brain W Wo Contrast; Future     Lelon Huh, MD  [facility]

## 2019-06-26 ENCOUNTER — Telehealth: Payer: Self-pay

## 2019-06-26 ENCOUNTER — Ambulatory Visit
Admission: RE | Admit: 2019-06-26 | Discharge: 2019-06-26 | Disposition: A | Discharge status: Home / Self Care | Payer: BC Managed Care – PPO | Source: Ambulatory Visit | Attending: Family Medicine | Admitting: Family Medicine

## 2019-06-26 ENCOUNTER — Other Ambulatory Visit: Payer: Self-pay

## 2019-06-26 DIAGNOSIS — S0990XD Unspecified injury of head, subsequent encounter: Secondary | ICD-10-CM

## 2019-06-26 DIAGNOSIS — R42 Dizziness and giddiness: Secondary | ICD-10-CM

## 2019-06-26 DIAGNOSIS — H539 Unspecified visual disturbance: Secondary | ICD-10-CM

## 2019-06-26 DIAGNOSIS — R410 Disorientation, unspecified: Secondary | ICD-10-CM | POA: Diagnosis present

## 2019-06-26 DIAGNOSIS — G4489 Other headache syndrome: Secondary | ICD-10-CM

## 2019-06-26 DIAGNOSIS — Z01812 Encounter for preprocedural laboratory examination: Secondary | ICD-10-CM

## 2019-06-26 LAB — POCT I-STAT CREATININE: Creatinine, Ser: 0.7 mg/dL (ref 0.44–1.00)

## 2019-06-26 MED ORDER — GADOBUTROL 1 MMOL/ML IV SOLN
10.0000 mL | Freq: Once | INTRAVENOUS | Status: AC | PRN
  Administered 2019-06-26: 10 mL via INTRAVENOUS

## 2019-06-26 NOTE — Telephone Encounter (Signed)
Copied from [place] [id number]. Topic: General - Other >> Jun 26, 2019 12:39 PM Parke Poisson wrote: Reason for CRM: [facility] is requesting an order for lab work to get kidney function checked before MRI today

## 2019-06-26 NOTE — Telephone Encounter (Signed)
Erin Sherman was schedule 07-06-2019 but patient needs done sooner. I put in new order to be done stat.   Thanks.

## 2019-06-26 NOTE — Telephone Encounter (Signed)
Patient is scheduled to have STAT MRI today at [facility] called requesting an order for lab work to check kidney function before MIR. Dr. Caryn Section has already left for today. Please advise if ok to order renal panel. If so, which dx should I use?

## 2019-06-26 NOTE — Addendum Note (Signed)
Addended by: Randal Buba on: 06/26/2019 02:33 PM   Modules accepted: Orders

## 2019-06-26 NOTE — Addendum Note (Signed)
Addended by: Randal Buba on: 06/26/2019 04:37 PM   Modules accepted: Orders

## 2019-06-26 NOTE — Telephone Encounter (Signed)
Copied from [place] [id number]. Topic: General - Other >> Jun 25, 2019  5:12 PM Parke Poisson wrote: Reason for CRM: Pt's appointment for MRI is not until 07/06/19.She states she can not wait that long. They will move to a sooner date if order is changed to Stat

## 2019-06-26 NOTE — Addendum Note (Signed)
Addended by: Birdie Sons on: 06/26/2019 11:41 AM   Modules accepted: Orders

## 2019-06-27 ENCOUNTER — Other Ambulatory Visit: Payer: Self-pay | Admitting: Family Medicine

## 2019-06-27 DIAGNOSIS — S0990XD Unspecified injury of head, subsequent encounter: Secondary | ICD-10-CM

## 2019-06-27 DIAGNOSIS — H539 Unspecified visual disturbance: Secondary | ICD-10-CM

## 2019-06-27 DIAGNOSIS — G4489 Other headache syndrome: Secondary | ICD-10-CM

## 2019-06-27 DIAGNOSIS — R42 Dizziness and giddiness: Secondary | ICD-10-CM

## 2019-06-27 DIAGNOSIS — R410 Disorientation, unspecified: Secondary | ICD-10-CM

## 2019-07-01 ENCOUNTER — Telehealth: Payer: Self-pay

## 2019-07-01 NOTE — Telephone Encounter (Signed)
-----   Message from Birdie Sons, MD sent at 06/27/2019  8:53 AM EST ----- MRI is normal. Recommend referral to neurology for persistent headaches. Have placed order.

## 2019-07-01 NOTE — Telephone Encounter (Signed)
Patient advised.

## 2019-07-03 ENCOUNTER — Other Ambulatory Visit: Payer: Self-pay | Admitting: Family Medicine

## 2019-07-03 DIAGNOSIS — R519 Headache, unspecified: Secondary | ICD-10-CM

## 2019-07-03 NOTE — Telephone Encounter (Signed)
Requested medication (s) are due for refill today: yes  Requested medication (s) are on the active medication list: yes  Last refill:  06/11/2019  Future visit scheduled: no  Notes to clinic: This refill cannot be delegated    Requested Prescriptions  Pending Prescriptions Disp Refills   topiramate (TOPAMAX) 50 MG tablet [Pharmacy Med Name: TOPIRAMATE 50 MG TABLET] 60 tablet 0    Sig: 1/2 tablet every night for 2 days, then increase to 1 tablet every night for 7 days, then 1 tablet twice daily      Not Delegated - Neurology: Anticonvulsants - topiramate & zonisamide Failed - 07/03/2019  2:33 PM      Failed - This refill cannot be delegated      Failed - CO2 in normal range and within 360 days    CO2  Date Value Ref Range Status  12/18/2017 24 22 - 32 mmol/L Final   Co2  Date Value Ref Range Status  09/30/2014 26 mmol/L Final    Comment:    22-32 NOTE: New Reference Range  09/09/14           Passed - Cr in normal range and within 360 days    Creatinine  Date Value Ref Range Status  09/30/2014 0.70 mg/dL Final    Comment:    0.44-1.00 NOTE: New Reference Range  09/09/14    Creatinine, Ser  Date Value Ref Range Status  06/26/2019 0.70 0.44 - 1.00 mg/dL Final          Passed - Valid encounter within last 12 months    Recent Outpatient Visits           1 week ago Other headache syndrome   [facility] Birdie Sons, MD   3 weeks ago Concussion without loss of consciousness, subsequent encounter   [facility] Birdie Sons, MD   1 month ago Acute post-traumatic headache, not intractable   [facility] Birdie Sons, MD   2 months ago Ola, Donald E, MD   3 months ago DDD (degenerative disc disease), lumbar   [facility] [place], Dionne Bucy, MD

## 2019-07-04 ENCOUNTER — Telehealth: Payer: Self-pay | Admitting: Family Medicine

## 2019-07-04 NOTE — Telephone Encounter (Signed)
2 Disability forms are being sent back for Erin Sherman to help pay 2 different bills she has.

## 2019-07-06 ENCOUNTER — Ambulatory Visit: Payer: BC Managed Care – PPO

## 2019-07-08 NOTE — Telephone Encounter (Signed)
Dr. Caryn Section, this message was created 4 days ago and sent to [place] box. Have you seen these forms? Im trying to track down the forms to make sure you received them.

## 2019-07-08 NOTE — Telephone Encounter (Signed)
Yes, they came in last week. I haven't had time to complete them yet.

## 2019-07-11 NOTE — Telephone Encounter (Signed)
Pt needs the short term disability returned from 12/18 to Jan 1 unable to work. She is going to re fax now.

## 2019-07-12 NOTE — Telephone Encounter (Signed)
Tried calling patient. Left message to call back. OK for PEC to ask patient the questions below, then route message back to the office with patient's response.

## 2019-07-12 NOTE — Telephone Encounter (Signed)
Was she cleared to return to work by neurologist? If so, when did she return to work.

## 2019-07-12 NOTE — Telephone Encounter (Signed)
Called pt, no answer. Left voice message to call back to clarify what form she is needing.

## 2019-07-12 NOTE — Telephone Encounter (Signed)
Pt stated that she she was clear by Neurologist and that he left it up to her so she returned to work on 1.1.21 but is still having problems with right eye/ she get headaches and wears sun glasses at work due to light. Pt also asked if FMLA paper work can be re faxed due to it being denied and she wanted to know when she can pick up disability paperwork that she dropped off / please advise

## 2019-07-12 NOTE — Telephone Encounter (Signed)
Disability forms are done.   Is she talking about the FMLA forms from November 9th? Ok to refax if so.

## 2019-07-16 NOTE — Telephone Encounter (Signed)
Pt calling back once again, as she has called several times.  She states it is the continuous FMLA paperwork from December .  Pt states she has since re faxed on 07/11/19 and wanted to make sure you got it.  Pt states yes, she was cleared by the neurologist to return to work 07/05/19. But she needs this was to be faxed 06/21/19 after her visit.  She did return to work on 07/05/19.

## 2019-07-16 NOTE — Telephone Encounter (Signed)
I called and advised patient that the disability forms are ready for pick up.  Patient states that the FMLA paperwork that was filled out last month during her last office visit on 06/21/2019 was supposed to have a written note attached to it extending her to be out of work from 06/21/2019 until 07/05/2019. Patient states Dr. Caryn Section wrote the note on a prescription pad while she was in the office and told her it would be faxed with the [facility] paperwork. Patient states her FMLA was denied because that note wasn't sent with the paperwork. Patient request that we fax that note that was written on a prescription pad to her [employer].

## 2019-07-17 NOTE — Telephone Encounter (Signed)
done

## 2019-07-18 ENCOUNTER — Other Ambulatory Visit: Payer: Self-pay | Admitting: Family Medicine

## 2019-07-18 DIAGNOSIS — R519 Headache, unspecified: Secondary | ICD-10-CM

## 2019-07-18 NOTE — Telephone Encounter (Signed)
Requested medication (s) are due for refill today: yes  Requested medication (s) are on the active medication list: yes  Last refill: 07/03/2019  Future visit scheduled: no  Notes to clinic:  this refill cannot be delegated    Requested Prescriptions  Pending Prescriptions Disp Refills   topiramate (TOPAMAX) 50 MG tablet [Pharmacy Med Name: TOPIRAMATE 50 MG TABLET] 180 tablet 1    Sig: TAKE 1/2 TABLET AT NIGHT X2 DAYS, THEN INCREASE TO 1 TABLET NIGHTLY X7 DAYS, THEN 1 TWICE DAILY      Not Delegated - Neurology: Anticonvulsants - topiramate & zonisamide Failed - 07/18/2019  8:45 AM      Failed - This refill cannot be delegated      Failed - CO2 in normal range and within 360 days    CO2  Date Value Ref Range Status  12/18/2017 24 22 - 32 mmol/L Final   Co2  Date Value Ref Range Status  09/30/2014 26 mmol/L Final    Comment:    22-32 NOTE: New Reference Range  09/09/14           Passed - Cr in normal range and within 360 days    Creatinine  Date Value Ref Range Status  09/30/2014 0.70 mg/dL Final    Comment:    0.44-1.00 NOTE: New Reference Range  09/09/14    Creatinine, Ser  Date Value Ref Range Status  06/26/2019 0.70 0.44 - 1.00 mg/dL Final          Passed - Valid encounter within last 12 months    Recent Outpatient Visits           3 weeks ago Other headache syndrome   [facility] Birdie Sons, MD   1 month ago Concussion without loss of consciousness, subsequent encounter   [facility] Birdie Sons, MD   1 month ago Acute post-traumatic headache, not intractable   [facility] Birdie Sons, MD   3 months ago Clifford, Donald E, MD   4 months ago DDD (degenerative disc disease), lumbar   [facility] [place], Dionne Bucy, MD

## 2019-08-09 DIAGNOSIS — H53149 Visual discomfort, unspecified: Secondary | ICD-10-CM | POA: Insufficient documentation

## 2019-08-09 DIAGNOSIS — G479 Sleep disorder, unspecified: Secondary | ICD-10-CM | POA: Insufficient documentation

## 2019-08-30 ENCOUNTER — Telehealth (INDEPENDENT_AMBULATORY_CARE_PROVIDER_SITE_OTHER): Payer: BC Managed Care – PPO | Admitting: Family Medicine

## 2019-08-30 DIAGNOSIS — M7061 Trochanteric bursitis, right hip: Secondary | ICD-10-CM

## 2019-08-30 DIAGNOSIS — J0110 Acute frontal sinusitis, unspecified: Secondary | ICD-10-CM

## 2019-08-30 MED ORDER — OXYCODONE-ACETAMINOPHEN 7.5-325 MG PO TABS: 1.0000 | ORAL_TABLET | Freq: Four times a day (QID) | ORAL | 0 refills | Status: DC | PRN

## 2019-08-30 MED ORDER — AMOXICILLIN 500 MG PO CAPS: 1000.0000 mg | ORAL_CAPSULE | Freq: Two times a day (BID) | ORAL | 0 refills | Status: AC

## 2019-08-30 NOTE — Progress Notes (Signed)
Patient: Erin Sherman Female    DOB: [birth year]   54 y.o.   MRN: [id number] Visit Date: 08/30/2019  Today's Provider: Lelon Huh, MD   Chief Complaint  Patient presents with  . Sinusitis   Subjective:    Virtual Visit via Telephone Note  I connected with Erin Sherman on 08/30/19 at  3:40 PM EST by telephone and verified that I am speaking with the correct person using two identifiers.  Location: Patient: home Provider: bfp   I discussed the limitations, risks, security and privacy concerns of performing an evaluation and management service by telephone and the availability of in person appointments. I also discussed with the patient that there may be a patient responsible charge related to this service. The patient expressed understanding and agreed to proceed.   Sinusitis This is a new problem. The current episode started in the past 7 days. The problem has been gradually worsening since onset. There has been no fever. Associated symptoms include ear pain, headaches, sinus pressure, a sore throat and swollen glands. (Post nasal drainage ) Treatments tried: Zyrtec D. The treatment provided mild relief.  Has gotten much worse overnight. No fevers, chills, or sweats.   Allergies  Allergen Reactions  . Latex     swelling  . Sulfa Antibiotics     Hives, itching  . Meperidine Rash     Current Outpatient Medications:  .  nortriptyline (PAMELOR) 10 MG capsule, Start Nortriptyline (Pamelor) 10 mg nightly for one week, then increase to 20 mg nightly, Disp: , Rfl:  .  budesonide-formoterol (SYMBICORT) 160-4.5 MCG/ACT inhaler, Inhale 2 puffs into the lungs 2 (two) times daily., Disp: , Rfl:  .  calcium carbonate (TUMS) 500 MG chewable tablet, Chew 1 tablet by mouth daily., Disp: , Rfl:  .  cholecalciferol (VITAMIN D) 1000 UNITS tablet, Take 1,000 Units by mouth daily., Disp: , Rfl:  .  cyclobenzaprine (FLEXERIL) 5 MG tablet, Take 1-2 tablets (5-10 mg total) by  mouth at bedtime., Disp: 60 tablet, Rfl: 2 .  diazepam (VALIUM) 10 MG tablet, Take one tablet 30-60 minutes prior to procedure, Disp: 1 tablet, Rfl: 0 .  diclofenac (FLECTOR) 1.3 % PTCH, Place 1 patch onto the skin 2 (two) times daily as needed., Disp: 60 patch, Rfl: 5 .  diclofenac sodium (VOLTAREN) 1 % GEL, Apply 4 g topically 4 (four) times daily., Disp: 100 g, Rfl: 1 .  ELDERBERRY PO, Take by mouth., Disp: , Rfl:  .  EPINEPHrine (EPIPEN 2-PAK) 0.3 mg/0.3 mL IJ SOAJ injection, Inject 0.3 mLs (0.3 mg total) into the muscle as needed. With allergic reaction, Disp: 2 each, Rfl: 1 .  hydrOXYzine (VISTARIL) 25 MG capsule, Take 1 capsule (25 mg total) by mouth 3 (three) times daily as needed for anxiety., Disp: 30 capsule, Rfl: 0 .  ipratropium-albuterol (DUONEB) 0.5-2.5 (3) MG/3ML SOLN, Inhale 3 mLs into the lungs every 6 (six) hours as needed. Reported on 12/28/2015, Disp: 360 mL, Rfl: 3 .  levalbuterol (XOPENEX HFA) 45 MCG/ACT inhaler, Inhale 2 puffs into the lungs every 6 (six) hours as needed for wheezing. May substitute generic, Disp: 1 Inhaler, Rfl: 12 .  methocarbamol (ROBAXIN) 500 MG tablet, Take 1 tablet (500 mg total) by mouth 2 (two) times daily as needed for muscle spasms., Disp: 60 tablet, Rfl: 3 .  mometasone (NASONEX) 50 MCG/ACT nasal spray, Place 2 sprays into the nose daily., Disp: 17 g, Rfl: 12 .  montelukast (SINGULAIR) 10 MG  tablet, Take 1 tablet (10 mg total) by mouth at bedtime., Disp: 30 tablet, Rfl: 5 .  naproxen (NAPROSYN) 500 MG tablet, Take 1 tablet (500 mg total) by mouth 2 (two) times daily with a meal., Disp: 30 tablet, Rfl: 0 .  oxyCODONE-acetaminophen (PERCOCET) 7.5-325 MG tablet, Take 1 tablet by mouth every 6 (six) hours as needed for severe pain., Disp: 30 tablet, Rfl: 0 .  phentermine 37.5 MG capsule, Take 1 capsule (37.5 mg total) by mouth every morning. DO NOT TAKE THIS MEDICATION WHEN TAKING ZYRTEC-D, Disp: 30 capsule, Rfl: 3 .  promethazine (PHENERGAN) 25 MG  tablet, Take 1 tablet (25 mg total) by mouth every 8 (eight) hours as needed for nausea., Disp: 20 tablet, Rfl: 0 .  sertraline (ZOLOFT) 100 MG tablet, TAKE 1 TABLET BY MOUTH EVERY DAY, Disp: 90 tablet, Rfl: 4 .  Spacer/Aero-Holding Chambers (OPTICHAMBER ADVANTAGE) MISC, 1 each by Other route once. Always uses her when you're using a metered-dose inhaler. You've aromatase medicine as much, he won't have his much side effect, but you it twice as much medicine and your lungs., Disp: 1 each, Rfl: 0 .  topiramate (TOPAMAX) 50 MG tablet, Take 1 tablet (50 mg total) by mouth 2 (two) times daily. TAKE 1/2 TABLET AT NIGHT X2 DAYS, THEN INCREASE TO 1 TABLET NIGHTLY X7 DAYS, THEN 1 TWICE DAILY, Disp: 180 tablet, Rfl: 0 .  triamcinolone cream (KENALOG) 0.5 %, Apply 1 application topically 3 (three) times daily., Disp: 45 g, Rfl: 2 .  ZYRTEC-D ALLERGY & CONGESTION 5-120 MG tablet, TAKE 1 TABLET BY MOUTH DAILY. AS NEEDED FOR ALLERGIES, Disp: 24 tablet, Rfl: 4  Review of Systems  Constitutional: Negative.   HENT: Positive for ear pain, postnasal drip, sinus pressure, sinus pain and sore throat.   Respiratory: Negative.   Cardiovascular: Negative.   Musculoskeletal: Negative.   Neurological: Positive for headaches.    Social History   Tobacco Use  . Smoking status: Former Smoker    Packs/day: 0.30    Years: 25.00    Pack years: 7.50    Types: Cigarettes    Quit date: 07/16/2008    Years since quitting: 11.1  . Smokeless tobacco: Never Used  Substance Use Topics  . Alcohol use: Yes    Comment: occasional beer.      Objective:   There were no vitals taken for this visit. There were no vitals filed for this visit.There is no height or weight on file to calculate BMI.   Physical Exam  Awake, alert, oriented x 3. In no apparent distress       Assessment & Plan     1. Acute non-recurrent frontal sinusitis  - amoxicillin (AMOXIL) 500 MG capsule; Take 2 capsules (1,000 mg total) by mouth 2  (two) times daily for 10 days.  Dispense: 40 capsule; Refill: 0 Call if symptoms change or if not rapidly improving.     2. Trochanteric bursitis of right hip She reports she has upcoming appointment for steroid injections and request refill of her pain medication to get by until then.   - oxyCODONE-acetaminophen (PERCOCET) 7.5-325 MG tablet; Take 1 tablet by mouth every 6 (six) hours as needed for severe pain.  Dispense: 30 tablet; Refill: 0     I discussed the assessment and treatment plan with the patient. The patient was provided an opportunity to ask questions and all were answered. The patient agreed with the plan and demonstrated an understanding of the instructions.   The patient  was advised to call back or seek an in-person evaluation if the symptoms worsen or if the condition fails to improve as anticipated.  I provided 11 minutes of non-face-to-face time during this encounter.  The entirety of the information documented in the History of Present Illness, Review of Systems and Physical Exam were personally obtained by me. Portions of this information were initially documented by Idelle Jo, CMA and reviewed by me for thoroughness and accuracy.     Lelon Huh, MD  [facility]

## 2019-09-26 DIAGNOSIS — R519 Headache, unspecified: Secondary | ICD-10-CM | POA: Insufficient documentation

## 2019-11-20 ENCOUNTER — Encounter: Payer: Self-pay | Admitting: Adult Health

## 2019-11-20 ENCOUNTER — Ambulatory Visit: Payer: Self-pay

## 2019-11-20 ENCOUNTER — Ambulatory Visit (INDEPENDENT_AMBULATORY_CARE_PROVIDER_SITE_OTHER): Payer: BC Managed Care – PPO | Admitting: Adult Health

## 2019-11-20 DIAGNOSIS — J301 Allergic rhinitis due to pollen: Secondary | ICD-10-CM | POA: Diagnosis not present

## 2019-11-20 DIAGNOSIS — J4540 Moderate persistent asthma, uncomplicated: Secondary | ICD-10-CM | POA: Insufficient documentation

## 2019-11-20 DIAGNOSIS — J4551 Severe persistent asthma with (acute) exacerbation: Secondary | ICD-10-CM | POA: Insufficient documentation

## 2019-11-20 DIAGNOSIS — J0140 Acute pansinusitis, unspecified: Secondary | ICD-10-CM | POA: Diagnosis not present

## 2019-11-20 MED ORDER — NEOMYCIN-POLYMYXIN-HC 1 % OT SOLN: 3.0000 [drp] | Freq: Four times a day (QID) | OTIC | 0 refills | Status: DC

## 2019-11-20 MED ORDER — ZYRTEC-D ALLERGY & CONGESTION 5-120 MG PO TB12: ORAL_TABLET | ORAL | 0 refills | Status: DC

## 2019-11-20 MED ORDER — MUCINEX 600 MG PO TB12: 600.0000 mg | ORAL_TABLET | Freq: Two times a day (BID) | ORAL | 0 refills | Status: DC

## 2019-11-20 MED ORDER — BUDESONIDE-FORMOTEROL FUMARATE 160-4.5 MCG/ACT IN AERO: 2.0000 | INHALATION_SPRAY | Freq: Two times a day (BID) | RESPIRATORY_TRACT | Status: DC

## 2019-11-20 MED ORDER — AMOXICILLIN-POT CLAVULANATE 875-125 MG PO TABS: 1.0000 | ORAL_TABLET | Freq: Two times a day (BID) | ORAL | 0 refills | Status: DC

## 2019-11-20 NOTE — Patient Instructions (Addendum)
Avoid decongestant when able as can cause elevated blood pressure.   Covid Testing Instructions for [facility]:  If you/your practice has a patient that needs an appointment, please have the patient text "COVID" to [phone], OR they can log on to [email or web address] to easily make an on-line appointment. Please note:  If you have a patient who does not have access to a smart phone or PC, you or the patient can call [phone] to get assistance.      Cetirizine; Pseudoephedrine extended-release tablets What is this medicine? CETIRIZINE; PSEUDOEPHEDRINE (se TI ra zeen; soo doe e FED rin) is an antihistamine and decongestant. This medicine is used to treat or prevent symptoms of allergies. It reduces congestion, sneezing, runny nose, and itching in the nose or throat. It also helps relieve itchy, watery, and red eyes. This medicine may be used for other purposes; ask your health care provider or pharmacist if you have questions. COMMON BRAND NAME(S): All Day Allergy-D, Allergy Relief Nasal Decongestant 12 Hour, H-E-B Cetrizine/Psuedoephedrine, Zyrtec-D, Zyrtec-D Allergy + Congestion What should I tell my health care provider before I take this medicine? They need to know if you have any of these conditions:  diabetes  glaucoma  heart disease  high blood pressure  kidney disease  liver disease  problems urinating  prostate disease  taken an MAOI like Carbex, Eldepryl, Marplan, Nardil, or Parnate in last 14 days  thyroid disease  an unusual or allergic reaction to cetirizine, hydroxyzine, pseudoephedrine, other medicines, foods, dyes, or preservatives  pregnant or trying to get pregnant  breast-feeding How should I use this medicine? Take this medicine by mouth with a glass of water. Follow the directions on the prescription label. Do not break, crush, or chew the tablets. Take your doses at regular intervals, no more than every 12 hours. You may take with or without  food. Do not take your medicine more often than directed. Contact your doctor or health care professional regarding the use of this medicine in children. Special care may be needed. While this drug may be prescribed for children as young as 39 years of age for selected conditions, precautions do apply. Patients over 106 years old may have a stronger reaction and need a smaller dose. Overdosage: If you think you have taken too much of this medicine contact a poison control center or emergency room at once. NOTE: This medicine is only for you. Do not share this medicine with others. What if I miss a dose? If you miss a dose, take it as soon as you can. If it is almost time for your next dose, take only that dose. Do not take double or extra doses. What may interact with this medicine? Do not take this medicine with any of the following medications:  antihistamines and other medicines for allergy, cough and cold  ergot alkaloids like dihydroergotamine, ergonovine, ergotamine, methylergonovine  herbal or dietary supplements containing ephedra or ephedrine  MAOIs like Carbex, Eldepryl, Marplan, Nardil, and Parnate This medicine may also interact with the following medications:  alcohol  bromocriptine  certain medicines for depression  digoxin  erythromycin  linezolid  ketoconazole  medicines for anxiety or sleep  methyldopa  narcotic medicines for pain  reserpine  stimulant medicines for attention disorders, weight loss, or to stay awake  yohimbine This list may not describe all possible interactions. Give your health care provider a list of all the medicines, herbs, non-prescription drugs, or dietary supplements you use. Also tell them if you  smoke, drink alcohol, or use illegal drugs. Some items may interact with your medicine. What should I watch for while using this medicine? Visit your doctor or health care professional for regular check ups. Tell your doctor or health  care professional if your symptoms do not improve. If you have high blood pressure, check your blood pressure regularly. Ask your health care professional what your blood pressure should be, and when you should contact him or her. You may get drowsy or dizzy. Do not drive, use machinery, or do anything that needs mental alertness until you know how this medicine affects you. Do not stand or sit up quickly, especially if you are an older patient. This reduces the risk of dizzy or fainting spells. Your mouth may get dry. Chewing sugarless gum or sucking hard candy, and drinking plenty of water may help. Contact your doctor if the problem does not go away or is severe. What side effects may I notice from receiving this medicine? Side effects that you should report to your doctor or health care professional as soon as possible:  allergic reactions like skin rash or hives, swelling of the face, lips, or tongue  breathing problems  changes in vision  dizziness  eye pain  fast, irregular heartbeat  high blood pressure  nervousness  restlessness  seizures  tremor  trouble passing urine or change in the amount of urine  trouble sleeping  unusually weak or tired Side effects that usually do not require medical attention (report to your doctor or health care professional if they continue or are bothersome):  dry mouth  headache  loss of appetite  nausea  sore throat  tiredness This list may not describe all possible side effects. Call your doctor for medical advice about side effects. You may report side effects to FDA at 1-800-FDA-1088. Where should I keep my medicine? Keep out of the reach of children. Store at room temperature between 15 and 30 degrees C (59 and 86 degrees F). Protect from moisture. Throw away any unused medicine after the expiration date. NOTE: This sheet is a summary. It may not cover all possible information. If you have questions about this medicine,  talk to your doctor, pharmacist, or health care provider.  2020 Elsevier/Gold Standard (2016-09-11 13:07:39) Sinusitis, Adult Sinusitis is soreness and swelling (inflammation) of your sinuses. Sinuses are hollow spaces in the bones around your face. They are located:  Around your eyes.  In the middle of your forehead.  Behind your nose.  In your cheekbones. Your sinuses and nasal passages are lined with a fluid called mucus. Mucus drains out of your sinuses. Swelling can trap mucus in your sinuses. This lets germs (bacteria, virus, or fungus) grow, which leads to infection. Most of the time, this condition is caused by a virus. What are the causes? This condition is caused by:  Allergies.  Asthma.  Germs.  Things that block your nose or sinuses.  Growths in the nose (nasal polyps).  Chemicals or irritants in the air.  Fungus (rare). What increases the risk? You are more likely to develop this condition if:  You have a weak body defense system (immune system).  You do a lot of swimming or diving.  You use nasal sprays too much.  You smoke. What are the signs or symptoms? The main symptoms of this condition are pain and a feeling of pressure around the sinuses. Other symptoms include:  Stuffy nose (congestion).  Runny nose (drainage).  Swelling and warmth  in the sinuses.  Headache.  Toothache.  A cough that may get worse at night.  Mucus that collects in the throat or the back of the nose (postnasal drip).  Being unable to smell and taste.  Being very tired (fatigue).  A fever.  Sore throat.  Bad breath. How is this diagnosed? This condition is diagnosed based on:  Your symptoms.  Your medical history.  A physical exam.  Tests to find out if your condition is short-term (acute) or long-term (chronic). Your doctor may: ? Check your nose for growths (polyps). ? Check your sinuses using a tool that has a light (endoscope). ? Check for allergies  or germs. ? Do imaging tests, such as an MRI or CT scan. How is this treated? Treatment for this condition depends on the cause and whether it is short-term or long-term.  If caused by a virus, your symptoms should go away on their own within 10 days. You may be given medicines to relieve symptoms. They include: ? Medicines that shrink swollen tissue in the nose. ? Medicines that treat allergies (antihistamines). ? A spray that treats swelling of the nostrils. ? Rinses that help get rid of thick mucus in your nose (nasal saline washes).  If caused by bacteria, your doctor may wait to see if you will get better without treatment. You may be given antibiotic medicine if you have: ? A very bad infection. ? A weak body defense system.  If caused by growths in the nose, you may need to have surgery. Follow these instructions at home: Medicines  Take, use, or apply over-the-counter and prescription medicines only as told by your doctor. These may include nasal sprays.  If you were prescribed an antibiotic medicine, take it as told by your doctor. Do not stop taking the antibiotic even if you start to feel better. Hydrate and humidify   Drink enough water to keep your pee (urine) pale yellow.  Use a cool mist humidifier to keep the humidity level in your home above 50%.  Breathe in steam for 10-15 minutes, 3-4 times a day, or as told by your doctor. You can do this in the bathroom while a hot shower is running.  Try not to spend time in cool or dry air. Rest  Rest as much as you can.  Sleep with your head raised (elevated).  Make sure you get enough sleep each night. General instructions   Put a warm, moist washcloth on your face 3-4 times a day, or as often as told by your doctor. This will help with discomfort.  Wash your hands often with soap and water. If there is no soap and water, use hand sanitizer.  Do not smoke. Avoid being around people who are smoking (secondhand  smoke).  Keep all follow-up visits as told by your doctor. This is important. Contact a doctor if:  You have a fever.  Your symptoms get worse.  Your symptoms do not get better within 10 days. Get help right away if:  You have a very bad headache.  You cannot stop throwing up (vomiting).  You have very bad pain or swelling around your face or eyes.  You have trouble seeing.  You feel confused.  Your neck is stiff.  You have trouble breathing. Summary  Sinusitis is swelling of your sinuses. Sinuses are hollow spaces in the bones around your face.  This condition is caused by tissues in your nose that become inflamed or swollen. This traps germs.  These can lead to infection.  If you were prescribed an antibiotic medicine, take it as told by your doctor. Do not stop taking it even if you start to feel better.  Keep all follow-up visits as told by your doctor. This is important. This information is not intended to replace advice given to you by your health care provider. Make sure you discuss any questions you have with your health care provider. Document Revised: 11/20/2017 Document Reviewed: 11/20/2017 Elsevier Patient Education  [place]. Hydrocortisone; Neomycin; Polymyxin B ear solution What is this medicine? HYDROCORTISONE; NEOMYCIN; and POLYMYXIN B (hye droe KOR ti sone; nee oh MYE sin; pol i MIX in B) is used to treat ear infections. This medicine may be used for other purposes; ask your health care provider or pharmacist if you have questions. COMMON BRAND NAME(S): AK-Spore HC, AK-Spore HC Otic, Antibiotic Otic, Cortisporin, Cortomycin, Oti-Sone, Oticin HC, Otimar, Otocidin What should I tell my health care provider before I take this medicine? They need to know if you have any of these conditions:  any other active infections  chronic ear infections or fluid in the ear  perforated ear drum  an unusual or allergic reaction to hydrocortisone, neomycin,  polymyxin B, sulfites, other medicines, foods, dyes, or preservatives  pregnant or trying to get pregnant  breast-feeding How should I use this medicine? This medicine is only for use in the ears. Follow the directions on the prescription label. Wash hands before and after use. Clean your ear of any fluid that can be easily removed. Do not insert any object or swab into the ear canal. Gently warm the bottle by holding it in the hand for 1 to 2 minutes. Lie down on your side with the infected ear facing upward. Try not to touch the tip of the dropper to your ear, fingertips, or other surface. Squeeze the bottle gently to put the prescribed number of drops in the ear canal. Stay in this position for 30 to 60 seconds to help the drops soak into the ear. Repeat the steps for the other ear if both ears are infected. Do not use your medicine more often than directed. Finish the full course of medicine prescribed by your doctor or health care professional even if you think your condition is better. Talk to your pediatrician regarding the use of this medicine in children. While this drug may be prescribed for selected conditions, precautions do apply. Overdosage: If you think you have taken too much of this medicine contact a poison control center or emergency room at once. NOTE: This medicine is only for you. Do not share this medicine with others. What if I miss a dose? If you miss a dose, use it as soon as you can. If it is almost time for your next dose, use only that dose. Do not take double or extra doses. What may interact with this medicine? Interactions are not expected. Do not use other ear products without talking to your doctor or health care professional. This list may not describe all possible interactions. Give your health care provider a list of all the medicines, herbs, non-prescription drugs, or dietary supplements you use. Also tell them if you smoke, drink alcohol, or use illegal drugs.  Some items may interact with your medicine. What should I watch for while using this medicine? Tell your doctor or health care professional if your ear infection does not get better in a few days. Do not use longer than 10 days  unless instructed by your doctor or health care professional. If rash or allergic reaction occurs, stop the product immediately and contact your physician. It is important that you keep the infected ear(s) clean and dry. When bathing, try not to get the infected ear(s) wet. Do not go swimming unless your doctor or health care professional has told you otherwise. To prevent the spread of infection, do not share ear products, or share towels and washcloths with anyone else. What side effects may I notice from receiving this medicine? Side effects that you should report to your doctor or health care professional as soon as possible:  rash  red, itchy, dry scaly skin at the affected site  worsening ear pain Side effects that usually do not require medical attention (report to your doctor or health care professional if they continue or are bothersome):  abnormal sensation in the ear  burning or stinging while putting the drops in the ear This list may not describe all possible side effects. Call your doctor for medical advice about side effects. You may report side effects to FDA at 1-800-FDA-1088. Where should I keep my medicine? Keep out of the reach of children. Store at room temperature between 15 and 25 degrees C (59 and 77 degrees F). Do not freeze. Throw away any unused medicine after the expiration date. NOTE: This sheet is a summary. It may not cover all possible information. If you have questions about this medicine, talk to your doctor, pharmacist, or health care provider.  2020 Elsevier/Gold Standard (2014-12-09 15:13:48) Amoxicillin; Clavulanic Acid Tablets What is this medicine? AMOXICILLIN; CLAVULANIC ACID (a mox i SIL in; KLAV yoo lan ic AS id) is a  penicillin antibiotic. It treats some infections caused by bacteria. It will not work for colds, the flu, or other viruses. This medicine may be used for other purposes; ask your health care provider or pharmacist if you have questions. COMMON BRAND NAME(S): Augmentin What should I tell my health care provider before I take this medicine? They need to know if you have any of these conditions:  bowel disease, like colitis  kidney disease  liver disease  mononucleosis  an unusual or allergic reaction to amoxicillin, penicillin, cephalosporin, other antibiotics, clavulanic acid, other medicines, foods, dyes, or preservatives  pregnant or trying to get pregnant  breast-feeding How should I use this medicine? Take this drug by mouth. Take it as directed on the prescription label at the same time every day. Take it with food at the start of a meal or snack. Take all of this drug unless your health care provider tells you to stop it early. Keep taking it even if you think you are better. Talk to your health care provider about the use of this drug in children. While it may be prescribed for selected conditions, precautions do apply. Overdosage: If you think you have taken too much of this medicine contact a poison control center or emergency room at once. NOTE: This medicine is only for you. Do not share this medicine with others. What if I miss a dose? If you miss a dose, take it as soon as you can. If it is almost time for your next dose, take only that dose. Do not take double or extra doses. What may interact with this medicine?  allopurinol  anticoagulants  birth control pills  methotrexate  probenecid This list may not describe all possible interactions. Give your health care provider a list of all the medicines, herbs, non-prescription drugs,  or dietary supplements you use. Also tell them if you smoke, drink alcohol, or use illegal drugs. Some items may interact with your  medicine. What should I watch for while using this medicine? Tell your doctor or healthcare provider if your symptoms do not improve. This medicine may cause serious skin reactions. They can happen weeks to months after starting the medicine. Contact your healthcare provider right away if you notice fevers or flu-like symptoms with a rash. The rash may be red or purple and then turn into blisters or peeling of the skin. Or, you might notice a red rash with swelling of the face, lips or lymph nodes in your neck or under your arms. Do not treat diarrhea with over the counter products. Contact your doctor if you have diarrhea that lasts more than 2 days or if it is severe and watery. If you have diabetes, you may get a false-positive result for sugar in your urine. Check with your doctor or healthcare provider. Birth control pills may not work properly while you are taking this medicine. Talk to your doctor about using an extra method of birth control. What side effects may I notice from receiving this medicine? Side effects that you should report to your doctor or health care professional as soon as possible:  allergic reactions like skin rash, itching or hives, swelling of the face, lips, or tongue  breathing problems  dark urine  fever or chills, sore throat  redness, blistering, peeling, or loosening of the skin, including inside the mouth  seizures  trouble passing urine or change in the amount of urine  unusual bleeding, bruising  unusually weak or tired  white patches or sores in the mouth or throat Side effects that usually do not require medical attention (report to your doctor or health care professional if they continue or are bothersome):  diarrhea  dizziness  headache  nausea, vomiting  stomach upset  vaginal or anal irritation This list may not describe all possible side effects. Call your doctor for medical advice about side effects. You may report side effects  to FDA at 1-800-FDA-1088. Where should I keep my medicine? Keep out of the reach of children and pets. Store at room temperature between 20 and 25 degrees C (68 and 77 degrees F). Throw away any unused drug after the expiration date. NOTE: This sheet is a summary. It may not cover all possible information. If you have questions about this medicine, talk to your doctor, pharmacist, or health care provider.  2020 Elsevier/Gold Standard (2019-01-21 11:55:53) Otitis Media, Adult  Otitis media means that the middle ear is red and swollen (inflamed) and full of fluid. The condition usually goes away on its own. Follow these instructions at home:  Take over-the-counter and prescription medicines only as told by your doctor.  If you were prescribed an antibiotic medicine, take it as told by your doctor. Do not stop taking the antibiotic even if you start to feel better.  Keep all follow-up visits as told by your doctor. This is important. Contact a doctor if:  You have bleeding from your nose.  There is a lump on your neck.  You are not getting better in 5 days.  You feel worse instead of better. Get help right away if:  You have pain that is not helped with medicine.  You have swelling, redness, or pain around your ear.  You get a stiff neck.  You cannot move part of your face (paralyzed).  You  notice that the bone behind your ear hurts when you touch it.  You get a very bad headache. Summary  Otitis media means that the middle ear is red, swollen, and full of fluid.  This condition usually goes away on its own. In some cases, treatment may be needed.  If you were prescribed an antibiotic medicine, take it as told by your doctor. This information is not intended to replace advice given to you by your health care provider. Make sure you discuss any questions you have with your health care provider. Document Revised: 06/02/2017 Document Reviewed: 07/11/2016 Elsevier Patient  Education  2020 [employer]. Allergic Rhinitis, Adult Allergic rhinitis is a reaction to allergens in the air. Allergens are tiny specks (particles) in the air that cause your body to have an allergic reaction. This condition cannot be passed from person to person (is not contagious). Allergic rhinitis cannot be cured, but it can be controlled. There are two types of allergic rhinitis:  Seasonal. This type is also called hay fever. It happens only during certain times of the year.  Perennial. This type can happen at any time of the year. What are the causes? This condition may be caused by:  Pollen from grasses, trees, and weeds.  House dust mites.  Pet dander.  Mold. What are the signs or symptoms? Symptoms of this condition include:  Sneezing.  Runny or stuffy nose (nasal congestion).  A lot of mucus in the back of the throat (postnasal drip).  Itchy nose.  Tearing of the eyes.  Trouble sleeping.  Being sleepy during day. How is this treated? There is no cure for this condition. You should avoid things that trigger your symptoms (allergens). Treatment can help to relieve symptoms. This may include:  Medicines that block allergy symptoms, such as antihistamines. These may be given as a shot, nasal spray, or pill.  Shots that are given until your body becomes less sensitive to the allergen (desensitization).  Stronger medicines, if all other treatments have not worked. Follow these instructions at home: Avoiding allergens   Find out what you are allergic to. Common allergens include smoke, dust, and pollen.  Avoid them if you can. These are some of the things that you can do to avoid allergens: ? Replace carpet with wood, tile, or vinyl flooring. Carpet can trap dander and dust. ? Clean any mold found in the home. ? Do not smoke. Do not allow smoking in your home. ? Change your heating and air conditioning filter at least once a month. ? During allergy season:   Keep windows closed as much as you can. If possible, use air conditioning when there is a lot of pollen in the air.  Use a special filter for allergies with your furnace and air conditioner.  Plan outdoor activities when pollen counts are lowest. This is usually during the early morning or evening hours.  If you do go outdoors when pollen count is high, wear a special mask for people with allergies.  When you come indoors, take a shower and change your clothes before sitting on furniture or bedding. General instructions  Do not use fans in your home.  Do not hang clothes outside to dry.  Wear sunglasses to keep pollen out of your eyes.  Wash your hands right away after you touch household pets.  Take over-the-counter and prescription medicines only as told by your doctor.  Keep all follow-up visits as told by your doctor. This is important. Contact a  doctor if:  You have a fever.  You have a cough that does not go away (is persistent).  You start to make whistling sounds when you breathe (wheeze).  Your symptoms do not get better with treatment.  You have thick fluid coming from your nose.  You start to have nosebleeds. Get help right away if:  Your tongue or your lips are swollen.  You have trouble breathing.  You feel dizzy or you feel like you are going to pass out (faint).  You have cold sweats. Summary  Allergic rhinitis is a reaction to allergens in the air.  This condition may be caused by allergens. These include pollen, dust mites, pet dander, and mold.  Symptoms include a runny, itchy nose, sneezing, or tearing eyes. You may also have trouble sleeping or feel sleepy during the day.  Treatment includes taking medicines and avoiding allergens. You may also get shots or take stronger medicines.  Get help if you have a fever or a cough that does not stop. Get help right away if you are short of breath. This information is not intended to replace advice  given to you by your health care provider. Make sure you discuss any questions you have with your health care provider. Document Revised: 10/09/2018 Document Reviewed: 01/09/2018 Elsevier Patient Education  [place].

## 2019-11-20 NOTE — Telephone Encounter (Signed)
Pt. Started having sinus pain and pressure last week. Also asthma flare up. Using her nebulizer. Has thick dark discharge from nose. Dry cough. Bilateral ear pain. Has been taking generic Zyrtec.Denies fever. "I just feel bad." Virtual visit for this morning.  Reason for Disposition . [1] SEVERE pain AND [2] not improved 2 hours after pain medicine  Answer Assessment - Initial Assessment Questions 1. LOCATION: "Where does it hurt?"      Face, ears 2. ONSET: "When did the sinus pain start?"  (e.g., hours, days)      Started last week 3. SEVERITY: "How bad is the pain?"   (Scale 1-10; mild, moderate or severe)   - MILD (1-3): doesn't interfere with normal activities    - MODERATE (4-7): interferes with normal activities (e.g., work or school) or awakens from sleep   - SEVERE (8-10): excruciating pain and patient unable to do any normal activities        Moderate 4. RECURRENT SYMPTOM: "Have you ever had sinus problems before?" If so, ask: "When was the last time?" and "What happened that time?"      Yes 5. NASAL CONGESTION: "Is the nose blocked?" If so, ask, "Can you open it or must you breathe through the mouth?"     Can breath 6. NASAL DISCHARGE: "Do you have discharge from your nose?" If so ask, "What color?"     Dark and thick 7. FEVER: "Do you have a fever?" If so, ask: "What is it, how was it measured, and when did it start?"      No 8. OTHER SYMPTOMS: "Do you have any other symptoms?" (e.g., sore throat, cough, earache, difficulty breathing)     Wheezing, ear pain, cough 9. PREGNANCY: "Is there any chance you are pregnant?" "When was your last menstrual period?"     No  Protocols used: SINUS PAIN OR CONGESTION-A-AH

## 2019-11-20 NOTE — Progress Notes (Signed)
Virtual telephone visit    Virtual Visit via Telephone Note   This visit type was conducted due to national recommendations for restrictions regarding the COVID-19 Pandemic (e.g. social distancing) in an effort to limit this patient's exposure and mitigate transmission in our community. Due to her co-morbid illnesses, this patient is at least at moderate risk for complications without adequate follow up. This format is felt to be most appropriate for this patient at this time. The patient did not have access to video technology or had technical difficulties with video requiring transitioning to audio format only (telephone). Physical exam was limited to content and character of the telephone converstion.    Patient location: at home  Provider location: Provider: Provider's office at  [facility], [place].       Visit Date: 11/20/2019  Today's healthcare provider: Marcille Buffy, FNP   Chief Complaint  Patient presents with  . Sinus Problem   Subjective    Sinus Problem This is a new problem. The current episode started in the past 7 days. There has been no fever. Associated symptoms include congestion, coughing, shortness of breath, sinus pressure and sneezing. Pertinent negatives include no chills, diaphoresis, ear pain, headaches, hoarse voice, neck pain, sore throat or swollen glands. Treatments tried: zyrtec D. The treatment provided mild relief.  Onset of symptoms was on 11/09/2019 she reports she was taking off brand Zyrtec D. She always takes Zyrtec D.   She has been having asthma flare.   She denies any loss of taste of smell. She has repeat allergy symptoms. Nasonex she is using. She has had some mild wheezing inhalers control. She wants to avoid steroids as she will be having a breast reduction in the next 6 months she reports.   She has facial pressure, and ear pain, ear pain in right ear, denies any drainage.   She denies any  respiratory distress, she is able to carry on her normal activities , she has been mowing the yard which worsened her symptoms.  Denies any ill exposures.   Patient  denies any fever, body aches,chills, rash, chest pain, shortness of breath, nausea, vomiting, or diarrhea.     Allergies  Allergen Reactions  . Latex     swelling  . Sulfa Antibiotics     Hives, itching  . Meperidine Rash    Patient Active Problem List   Diagnosis Date Noted  . Hyperplastic polyp of sigmoid colon 11/29/2016  . Polyp of sigmoid colon   . Itching 11/17/2015  . Trochanteric bursitis of right hip 07/01/2015  . Low back pain 07/01/2015  . DDD (degenerative disc disease), lumbar 05/15/2015  . Carpal tunnel syndrome 05/07/2015  . Hip pain 05/07/2015  . History of cervical cancer 05/07/2015  . HLD (hyperlipidemia) 05/07/2015  . Menopausal symptom 05/07/2015  . Obesity 05/07/2015  . 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 Urinary incontinence 05/07/2015  . Nausea 05/07/2015  . Asthma, chronic 07/16/2013  . Allergic rhinitis 07/16/2013  . GERD (gastroesophageal reflux disease) 07/16/2013   Past Medical History:  Diagnosis Date  . Cervical cancer ([place])   . Hx of bronchitis   . MI (myocardial infarction) ([place])   . Migraines   . Stomach ulcer   . Stroke [facility])    Allergies  Allergen Reactions  . Latex     swelling  . Sulfa Antibiotics     Hives, itching  . Meperidine Rash      Medications: Outpatient Medications Prior to  Visit  Medication Sig  . budesonide-formoterol (SYMBICORT) 160-4.5 MCG/ACT inhaler Inhale 2 puffs into the lungs 2 (two) times daily.  Marland Kitchen ipratropium-albuterol (DUONEB) 0.5-2.5 (3) MG/3ML SOLN Inhale 3 mLs into the lungs every 6 (six) hours as needed. Reported on 12/28/2015  . levalbuterol (XOPENEX HFA) 45 MCG/ACT inhaler Inhale 2 puffs into the lungs every 6 (six) hours as needed for wheezing. May substitute generic  . nortriptyline (PAMELOR) 10 MG capsule Start Nortriptyline (Pamelor)  10 mg nightly for one week, then increase to 20 mg nightly  . ZYRTEC-D ALLERGY & CONGESTION 5-120 MG tablet TAKE 1 TABLET BY MOUTH DAILY. AS NEEDED FOR ALLERGIES  . calcium carbonate (TUMS) 500 MG chewable tablet Chew 1 tablet by mouth daily.  . cholecalciferol (VITAMIN D) 1000 UNITS tablet Take 1,000 Units by mouth daily.  . cyclobenzaprine (FLEXERIL) 5 MG tablet Take 1-2 tablets (5-10 mg total) by mouth at bedtime. (Patient not taking: Reported on 11/20/2019)  . diclofenac (FLECTOR) 1.3 % PTCH Place 1 patch onto the skin 2 (two) times daily as needed. (Patient not taking: Reported on 11/20/2019)  . diclofenac sodium (VOLTAREN) 1 % GEL Apply 4 g topically 4 (four) times daily. (Patient not taking: Reported on 11/20/2019)  . ELDERBERRY PO Take by mouth.  . EPINEPHrine (EPIPEN 2-PAK) 0.3 mg/0.3 mL IJ SOAJ injection Inject 0.3 mLs (0.3 mg total) into the muscle as needed. With allergic reaction (Patient not taking: Reported on 11/20/2019)  . hydrOXYzine (VISTARIL) 25 MG capsule Take 1 capsule (25 mg total) by mouth 3 (three) times daily as needed for anxiety. (Patient not taking: Reported on 11/20/2019)  . methocarbamol (ROBAXIN) 500 MG tablet Take 1 tablet (500 mg total) by mouth 2 (two) times daily as needed for muscle spasms. (Patient not taking: Reported on 11/20/2019)  . mometasone (NASONEX) 50 MCG/ACT nasal spray Place 2 sprays into the nose daily. (Patient not taking: Reported on 11/20/2019)  . montelukast (SINGULAIR) 10 MG tablet Take 1 tablet (10 mg total) by mouth at bedtime. (Patient not taking: Reported on 11/20/2019)  . naproxen (NAPROSYN) 500 MG tablet Take 1 tablet (500 mg total) by mouth 2 (two) times daily with a meal. (Patient not taking: Reported on 11/20/2019)  . oxyCODONE-acetaminophen (PERCOCET) 7.5-325 MG tablet Take 1 tablet by mouth every 6 (six) hours as needed for severe pain. (Patient not taking: Reported on 11/20/2019)  . phentermine 37.5 MG capsule Take 1 capsule (37.5 mg total) by  mouth every morning. DO NOT TAKE THIS MEDICATION WHEN TAKING ZYRTEC-D (Patient not taking: Reported on 11/20/2019)  . promethazine (PHENERGAN) 25 MG tablet Take 1 tablet (25 mg total) by mouth every 8 (eight) hours as needed for nausea. (Patient not taking: Reported on 11/20/2019)  . sertraline (ZOLOFT) 100 MG tablet TAKE 1 TABLET BY MOUTH EVERY DAY (Patient not taking: Reported on 11/20/2019)  . Spacer/Aero-Holding Chambers (OPTICHAMBER ADVANTAGE) MISC 1 each by Other route once. Always uses her when you're using a metered-dose inhaler. You've aromatase medicine as much, he won't have his much side effect, but you it twice as much medicine and your lungs. (Patient not taking: Reported on 11/20/2019)  . topiramate (TOPAMAX) 50 MG tablet Take 1 tablet (50 mg total) by mouth 2 (two) times daily. TAKE 1/2 TABLET AT NIGHT X2 DAYS, THEN INCREASE TO 1 TABLET NIGHTLY X7 DAYS, THEN 1 TWICE DAILY (Patient not taking: Reported on 11/20/2019)  . triamcinolone cream (KENALOG) 0.5 % Apply 1 application topically 3 (three) times daily. (Patient not taking: Reported on  11/20/2019)  . [DISCONTINUED] diazepam (VALIUM) 10 MG tablet Take one tablet 30-60 minutes prior to procedure   No facility-administered medications prior to visit.    Review of Systems  Constitutional: Negative for chills and diaphoresis.  HENT: Positive for congestion, sinus pressure and sneezing. Negative for ear pain, hoarse voice and sore throat.   Respiratory: Positive for cough, shortness of breath and wheezing.   Musculoskeletal: Negative for neck pain.  Neurological: Negative for headaches.    Last CBC Lab Results  Component Value Date   WBC 8.8 12/18/2017   HGB 11.4 (L) 12/18/2017   HCT 34.6 (L) 12/18/2017   MCV 84.6 12/18/2017   MCH 27.9 12/18/2017   RDW 13.0 12/18/2017   PLT 308 [id number]   Last metabolic panel Lab Results  Component Value Date   GLUCOSE 96 12/18/2017   NA 135 12/18/2017   K 3.9 12/18/2017   CL 102  12/18/2017   CO2 24 12/18/2017   BUN 15 12/18/2017   CREATININE 0.70 06/26/2019   GFRNONAA >60 12/18/2017   GFRAA >60 12/18/2017   CALCIUM 8.9 12/18/2017   ALT 22 12/17/2009   ANIONGAP 9 12/18/2017   Last lipids Lab Results  Component Value Date   CHOL 292 (A) 12/17/2009   HDL 49 12/17/2009   LDLCALC 207 12/17/2009   TRIG 179 (A) 12/17/2009   Last hemoglobin A1c No results found for: HGBA1C Last thyroid functions No results found for: TSH, T3TOTAL, T4TOTAL, THYROIDAB Last vitamin D No results found for: 25OHVITD2, 25OHVITD3, VD25OH Last vitamin B12 and Folate No results found for: VITAMINB12, FOLATE    Objective    There were no vitals taken for this visit. BP Readings from Last 3 Encounters:  06/21/19 125/81  06/11/19 120/70  06/05/19 (!) 152/77  No vital signs available.    Patient is alert and oriented and responsive to questions Engages in conversation with provider. Speaks in full sentences without any pauses without any shortness of breath or distress.  Phone visit only patient did not have [place] video capabilities.       Assessment & Plan     Acute non-recurrent pansinusitis  Allergic rhinitis due to pollen, unspecified seasonality - Plan: ZYRTEC-D ALLERGY & CONGESTION 5-120 MG tablet  Asthma, chronic, moderate persistent, uncomplicated - Plan: budesonide-formoterol (SYMBICORT) 160-4.5 MCG/ACT inhaler  She requests Zyrtec D- she says she always gets a prescription for it so that she can use her flexible spending card. Advised her to take plain Zyrtec without the decongestant when she can as it can elevate her blood pressure among other side effects discussed.   She requests ear drops, for ear pain, explained antibiotic should cover and she has not been swimming or had any pain with pulling on ear. She will try drops if needed.   Recommend covid testing.    Meds ordered this encounter  Medications  . amoxicillin-clavulanate (AUGMENTIN) 875-125 MG  tablet    Sig: Take 1 tablet by mouth 2 (two) times daily.    Dispense:  20 tablet    Refill:  0  . ZYRTEC-D ALLERGY & CONGESTION 5-120 MG tablet    Sig: TAKE 1 TABLET BY MOUTH DAILY. AS NEEDED FOR ALLERGIES can raise blood pressure    Dispense:  24 tablet    Refill:  0  . budesonide-formoterol (SYMBICORT) 160-4.5 MCG/ACT inhaler    Sig: Inhale 2 puffs into the lungs 2 (two) times daily.    Dispense:  1 Inhaler  . guaiFENesin (MUCINEX) 600 MG 12  hr tablet    Sig: Take 1 tablet (600 mg total) by mouth 2 (two) times daily.    Dispense:  60 tablet    Refill:  0  . NEOMYCIN-POLYMYXIN-HYDROCORTISONE (CORTISPORIN) 1 % SOLN OTIC solution    Sig: Place 3 drops into the right ear every 6 (six) hours.    Dispense:  10 mL    Refill:  0   Return if symptoms worsen or fail to improve, for at any time for any worsening symptoms, Go to Emergency room/ urgent care if worse.  Advised patient call the office or your primary care doctor for an appointment if no improvement within 72 hours or if any symptoms change or worsen at any time  Advised ER or urgent Care if after hours or on weekend. Call 911 for emergency symptoms at any time.Patinet verbalized understanding of all instructions given/reviewed and treatment plan and has no further questions or concerns at this time.     I discussed the assessment and treatment plan with the patient. The patient was provided an opportunity to ask questions and all were answered. The patient agreed with the plan and demonstrated an understanding of the instructions.   The patient was advised to call back or seek an in-person evaluation if the symptoms worsen or if the condition fails to improve as anticipated. Covid Testing Instructions for [place]:  If you/your practice has a patient that needs an appointment, please have the patient text "COVID" to [phone], OR they can log on to [email or web address] to easily make an on-line appointment. Please note:  If you  have a patient who does not have access to a smart phone or PC, you or the patient can call [phone] to get assistance.     I provided 24 minutes of non-face-to-face time during this encounter.  IWellington Hampshire Jamaiya Tunnell, FNP, have reviewed all documentation for this visit. The documentation on 11/20/19 for the exam, diagnosis, procedures, and orders are all accurate and complete.   Marcille Buffy, [place] [phone] (phone) [phone] (fax)  [place]

## 2019-11-20 NOTE — Telephone Encounter (Signed)
Patient seen. Complete.

## 2019-11-25 ENCOUNTER — Telehealth: Payer: Self-pay | Admitting: Family Medicine

## 2019-11-25 NOTE — Telephone Encounter (Signed)
Tried calling patient. Left message to call back. OK for PEC Triage to advise and route message back to office.

## 2019-11-25 NOTE — Telephone Encounter (Signed)
Patient is requesting a different inhaler than what was prescribed- sent for review of request

## 2019-11-25 NOTE — Telephone Encounter (Signed)
Copied from [place] [id number]. Topic: Quick Communication - Rx Refill/Question >> Nov 25, 2019 12:23 PM Erin Sherman wrote: Medication: Albuterol (Patient stated that the wrong medication was called in for her asthma. Patient is requesting the albuterol with no sulfate be called in. Patient is completely out of medication and would like her request expedited.)  Has the patient contacted their pharmacy? yes (Agent: If no, request that the patient contact the pharmacy for the refill.) (Agent: If yes, when and what did the pharmacy advise?)Contact Pcp  Preferred Pharmacy (with phone number or street name): [id number] - [place], [address]  Phone:  [phone] Fax:  [phone]     Agent: Please be advised that RX refills may take up to 3 business days. We ask that you follow-up with your pharmacy.

## 2019-11-25 NOTE — Telephone Encounter (Signed)
Inhaled albuterol is always albuterol sulfate. Sulfate is not the same thing as sulfa. There may be some filler in the new inhaler that she is reacting to, or she may have just had reaction to grass and pollens from mowing the law.

## 2019-11-25 NOTE — Telephone Encounter (Signed)
Patient returned call.  She states that she has had an inhaler that did not have sulfate as on ingredient.  She states she used this on and started to itch after mowing her lawn.  She denies rash swelling of her lips or tongue. She is concerned because she has a known allergy to sulfa. Patient states she is fine using her Symbicort for now but is requesting to speak with Dr Caryn Section

## 2019-11-25 NOTE — Telephone Encounter (Signed)
All inhaled albuterol has sulfate. Is it different inhaler that she needs?

## 2019-11-26 NOTE — Telephone Encounter (Signed)
Attempted to contact patient, no answer and voicemail is full. Okay for PEC to advise patient.  

## 2019-11-26 NOTE — Telephone Encounter (Signed)
The closest thing to albuterol that does not sulfate (which is not the same thing as sulfa) is Xopenex. She should have a years worth of refills on the Xopenex. If she is out then can send in refill for the Xopenex.

## 2019-11-27 NOTE — Telephone Encounter (Signed)
Attempted to contact pt on cell/home phone; message states voicemail is full.

## 2019-11-27 NOTE — Telephone Encounter (Signed)
Pt given information per Dr Lelon Huh, "The closest thing to albuterol that does not sulfate (which is not the same thing as sulfa) is Xopenex. She should have a years worth of refills on the Xopenex. If she is out then can send in refill for the Xopenex."; pt informed this is on her active medication list, and she should contact her pharmacy for refills; she verbalized understanding; will route to office for notification of this encounter.

## 2020-01-01 ENCOUNTER — Other Ambulatory Visit: Payer: Self-pay | Admitting: Family Medicine

## 2020-01-01 DIAGNOSIS — J301 Allergic rhinitis due to pollen: Secondary | ICD-10-CM

## 2020-01-01 NOTE — Telephone Encounter (Signed)
Requested medication (s) are due for refill today: yes  Requested medication (s) are on the active medication list: yes  Last refill:  11/20/19  Future visit scheduled: no  Notes to clinic: not delegated    Requested Prescriptions  Pending Prescriptions Disp Refills   ZYRTEC-D ALLERGY & CONGESTION 5-120 MG tablet [Pharmacy Med Name: ZYRTEC-D TABLET] 24 tablet 0    Sig: TAKE 1 TABLET BY MOUTH DAILY. AS NEEDED FOR ALLERGIES can raise blood pressure      Not Delegated - Ear, Nose, and Throat:  Decongestants - Pseudoephedrine Failed - 01/01/2020  4:06 PM      Failed - This refill cannot be delegated      Passed - Last BP in normal range    BP Readings from Last 1 Encounters:  06/21/19 125/81          Passed - Valid encounter within last 12 months    Recent Outpatient Visits           1 month ago Acute non-recurrent pansinusitis   [facility] Flinchum, Kelby Aline, FNP   4 months ago Acute non-recurrent frontal sinusitis   [facility] Birdie Sons, MD   6 months ago Other headache syndrome   [facility] Birdie Sons, MD   6 months ago Concussion without loss of consciousness, subsequent encounter   [facility] Birdie Sons, MD   7 months ago Acute post-traumatic headache, not intractable   [facility] Birdie Sons, MD

## 2020-01-02 NOTE — Telephone Encounter (Signed)
Requested medication (s) are due for refill today: yes  Requested medication (s) are on the active medication list: yes  Last refill:  11/20/19  Future visit scheduled: no  Notes to clinic: not delegated    Requested Prescriptions  Pending Prescriptions Disp Refills  . ZYRTEC-D ALLERGY & CONGESTION 5-120 MG tablet [Pharmacy Med Name: ZYRTEC-D TABLET] 24 tablet 0    Sig: TAKE 1 TABLET BY MOUTH DAILY. AS NEEDED FOR ALLERGIES CAN RAISE BLOOD PRESSURE     Not Delegated - Ear, Nose, and Throat:  Decongestants - Pseudoephedrine Failed - 01/02/2020  8:12 AM      Failed - This refill cannot be delegated      Passed - Last BP in normal range    BP Readings from Last 1 Encounters:  06/21/19 125/81         Passed - Valid encounter within last 12 months    Recent Outpatient Visits          1 month ago Acute non-recurrent pansinusitis   [facility] Flinchum, Kelby Aline, FNP   4 months ago Acute non-recurrent frontal sinusitis   [facility] Birdie Sons, MD   6 months ago Other headache syndrome   [facility] Birdie Sons, MD   6 months ago Concussion without loss of consciousness, subsequent encounter   [facility] Birdie Sons, MD   7 months ago Acute post-traumatic headache, not intractable   [facility] Birdie Sons, MD

## 2020-01-02 NOTE — Telephone Encounter (Signed)
Not a patient of [facility].

## 2020-01-21 ENCOUNTER — Telehealth: Payer: Self-pay

## 2020-01-21 NOTE — Telephone Encounter (Signed)
Yes, they came through. Will complete when able.

## 2020-01-21 NOTE — Telephone Encounter (Signed)
Tried calling patient. No answer. Unable to leave message due to mailbox full. Will try calling back at a later time. OK for PEC to advise of message below if she returns call.

## 2020-01-21 NOTE — Telephone Encounter (Signed)
Dr. Have you seen an recent FMLA paperwork?   Copied from [place] [id number]. Topic: General - Other >> Jan 21, 2020 10:25 AM Antonieta Iba C wrote: Reason for CRM: pt called in to check the status of her FMLA paperwork. Pt would like a call back to confirm receipt . Pt says that she faxed them over for completion on 01/18/20.

## 2020-01-24 NOTE — Telephone Encounter (Signed)
Forms faxed 01/24/2020. Pt advised. Thanks TNP

## 2020-01-28 ENCOUNTER — Ambulatory Visit: Payer: BC Managed Care – PPO | Attending: Internal Medicine

## 2020-01-28 ENCOUNTER — Other Ambulatory Visit: Payer: Self-pay

## 2020-01-28 DIAGNOSIS — Z23 Encounter for immunization: Secondary | ICD-10-CM

## 2020-01-28 NOTE — Progress Notes (Signed)
   Covid-19 Vaccination Clinic  Name:  Erin Sherman    MRN: [id number] DOB: August 22, 1965  01/28/2020  [name] was observed post Covid-19 immunization for 15 minutes without incident. She was provided with Vaccine Information Sheet and instruction to access the V-Safe system.   [name] was instructed to call 911 with any severe reactions post vaccine: Marland Kitchen Difficulty breathing  . Swelling of face and throat  . A fast heartbeat  . A bad rash all over body  . Dizziness and weakness   Immunizations Administered    Name Date Dose VIS Date Route   Pfizer COVID-19 Vaccine 01/28/2020  4:02 PM 0.3 mL 08/28/2018 Intramuscular   Manufacturer: [place]   Lot: [id number]   [place]: [id number]

## 2020-02-03 ENCOUNTER — Telehealth: Payer: Self-pay

## 2020-02-03 NOTE — Telephone Encounter (Signed)
Received FMLA forms from [place]. I contacted pt to get information because FMLA were recently completed. Pt stated that her previous FMLA forms for intermit leave due to asthma had expired and the recent forms only covered her for missing 1 day. Pt is requesting new forms be completed so she can have leave from work when needed due to asthma. Pt stated that her asthma is getting worse with the summer heat. Pt stated she included her FMLA forms that were completed Nov. 2020 as a reference. Forms have been placed in Dr. Maralyn Sago box.   Pt also stated that she is trying to avoid taking steroids to treat her asthma and would like a call back to discuss if there is anything she can try other than steroids. Please advise. Thanks TNP

## 2020-02-07 NOTE — Telephone Encounter (Signed)
Forms completed and faxed to [facility]. TNP

## 2020-02-17 ENCOUNTER — Ambulatory Visit: Payer: BC Managed Care – PPO

## 2020-02-17 ENCOUNTER — Ambulatory Visit: Payer: BC Managed Care – PPO | Attending: Internal Medicine

## 2020-02-17 DIAGNOSIS — Z23 Encounter for immunization: Secondary | ICD-10-CM

## 2020-02-17 NOTE — Progress Notes (Signed)
   Covid-19 Vaccination Clinic  Name:  Erin Sherman    MRN: [id number] DOB: 1965/09/14  02/17/2020  Erin Sherman was observed post Covid-19 immunization for 15 minutes without incident. She was provided with Vaccine Information Sheet and instruction to access the V-Safe system.   Erin Sherman was instructed to call 911 with any severe reactions post vaccine: Marland Kitchen Difficulty breathing  . Swelling of face and throat  . A fast heartbeat  . A bad rash all over body  . Dizziness and weakness   Immunizations Administered    Name Date Dose VIS Date Route   Pfizer COVID-19 Vaccine 02/17/2020  2:39 PM 0.3 mL 08/28/2018 Intramuscular   Manufacturer: [place]   Lot: [id number]   [place]: [id number]

## 2020-02-28 ENCOUNTER — Ambulatory Visit (INDEPENDENT_AMBULATORY_CARE_PROVIDER_SITE_OTHER): Payer: BC Managed Care – PPO | Admitting: Family Medicine

## 2020-02-28 ENCOUNTER — Encounter: Payer: Self-pay | Admitting: Family Medicine

## 2020-02-28 ENCOUNTER — Other Ambulatory Visit: Payer: Self-pay

## 2020-02-28 VITALS — BP 122/84 | HR 73 | Temp 98.8°F | Resp 16 | Wt 274.2 lb

## 2020-02-28 DIAGNOSIS — R300 Dysuria: Secondary | ICD-10-CM | POA: Diagnosis not present

## 2020-02-28 DIAGNOSIS — M7061 Trochanteric bursitis, right hip: Secondary | ICD-10-CM

## 2020-02-28 DIAGNOSIS — N390 Urinary tract infection, site not specified: Secondary | ICD-10-CM

## 2020-02-28 DIAGNOSIS — T782XXD Anaphylactic shock, unspecified, subsequent encounter: Secondary | ICD-10-CM

## 2020-02-28 DIAGNOSIS — R5383 Other fatigue: Secondary | ICD-10-CM

## 2020-02-28 LAB — POCT URINALYSIS DIPSTICK
Bilirubin, UA: NEGATIVE
Blood, UA: NEGATIVE
Glucose, UA: NEGATIVE
Ketones, UA: NEGATIVE
Leukocytes, UA: NEGATIVE
Nitrite, UA: NEGATIVE
Protein, UA: POSITIVE — AB
Spec Grav, UA: 1.01 (ref 1.010–1.025)
Urobilinogen, UA: 0.2 E.U./dL
pH, UA: 6.5 (ref 5.0–8.0)

## 2020-02-28 MED ORDER — OXYCODONE-ACETAMINOPHEN 7.5-325 MG PO TABS: 1.0000 | ORAL_TABLET | Freq: Four times a day (QID) | ORAL | 0 refills | Status: DC | PRN

## 2020-02-28 MED ORDER — CEPHALEXIN 500 MG PO CAPS: 500.0000 mg | ORAL_CAPSULE | Freq: Four times a day (QID) | ORAL | 0 refills | Status: AC

## 2020-02-28 MED ORDER — EPINEPHRINE 0.3 MG/0.3ML IJ SOAJ: 0.3000 mg | INTRAMUSCULAR | 1 refills | Status: DC | PRN

## 2020-02-28 NOTE — Progress Notes (Signed)
Established patient visit   Patient: Erin Sherman   DOB: [birth year]   54 y.o. Female  MRN: [id number] Visit Date: 02/28/2020  Today's healthcare provider: Lelon Huh, MD   Chief Complaint  Patient presents with  . Dysuria   Subjective    Dysuria  This is a new problem. The current episode started in the past 7 days. The problem occurs every urination. The problem has been unchanged. The pain is mild. There has been no fever. Associated symptoms include frequency and urgency. Pertinent negatives include no chills, discharge, flank pain, hematuria, hesitancy, nausea, possible pregnancy, sweats or vomiting. She has tried increased fluids for the symptoms. The treatment provided mild relief.   She states she starting drinking cranberry and lemon juice shortly after onset of symptoms and they have improved quite a bit since then, but states urine still has strong odor.     She also reports feeling extremely tired and having pains shooting into her left leg since she received her second Covid vaccine on 02-17-2020.     Objective    BP 122/84   Pulse 73   Temp 98.8 F (37.1 C) (Oral)   Resp 16   Wt 274 lb 3.2 oz (124.4 kg)   SpO2 97%   BMI 53.55 kg/m    Physical Exam  General appearance: Severely obese female, cooperative and in no acute distress Head: Normocephalic, without obvious abnormality, atraumatic Respiratory: Respirations even and unlabored, normal respiratory rate Extremities: All extremities are intact.  Skin: Skin color, texture, turgor normal. No rashes seen  Psych: Appropriate mood and affect. Neurologic: Mental status: Alert, oriented to person, place, and time, thought content appropriate.   Results for orders placed or performed in visit on 02/28/20  POCT Urinalysis Dipstick  Result Value Ref Range   Color, UA yellow    Clarity, UA clear    Glucose, UA Negative Negative   Bilirubin, UA negative    Ketones, UA negative    Spec Grav, UA 1.010  1.010 - 1.025   Blood, UA negative    pH, UA 6.5 5.0 - 8.0   Protein, UA Positive (A) Negative   Urobilinogen, UA 0.2 0.2 or 1.0 E.U./dL   Nitrite, UA negatiove    Leukocytes, UA Negative Negative   Appearance     Odor      Assessment & Plan     1. Dysuria Improved since started drinking more Cranberry and lemon juice, but not resolved. - cephALEXin (KEFLEX) 500 MG capsule; Take 1 capsule (500 mg total) by mouth 4 (four) times daily for 3 days.  Dispense: 12 capsule; Refill: 0  2. Urinary tract infection without hematuria, site unspecified   3. Other fatigue She states this started within a day of receiving her second Covid vaccine injection. Advised that if this is related to vaccine, it should completely resolve within a few weeks. Will check labs to rule out other potential causes.  - CBC - Comprehensive metabolic panel - TSH  4. Anaphylaxis, subsequent encounter refill- EPINEPHrine (EPIPEN 2-PAK) 0.3 mg/0.3 mL IJ SOAJ injection; Inject 0.3 mLs (0.3 mg total) into the muscle as needed. With allergic reaction  Dispense: 2 each; Refill: 1  5. Trochanteric bursitis of right hip refill- oxyCODONE-acetaminophen (PERCOCET) 7.5-325 MG tablet; Take 1 tablet by mouth every 6 (six) hours as needed for severe pain.  Dispense: 30 tablet; Refill: 0         The entirety of the information documented in the History  of Present Illness, Review of Systems and Physical Exam were personally obtained by me. Portions of this information were initially documented by the CMA and reviewed by me for thoroughness and accuracy.      Lelon Huh, MD  [facility] [phone] (phone) [phone] (fax)  [place]

## 2020-02-29 LAB — CBC
Hematocrit: 37.4 % (ref 34.0–46.6)
Hemoglobin: 11.8 g/dL (ref 11.1–15.9)
MCH: 26.9 pg (ref 26.6–33.0)
MCHC: 31.6 g/dL (ref 31.5–35.7)
MCV: 85 fL (ref 79–97)
Platelets: 327 10*3/uL (ref 150–450)
RBC: 4.38 x10E6/uL (ref 3.77–5.28)
RDW: 12 % (ref 11.7–15.4)
WBC: 6.7 10*3/uL (ref 3.4–10.8)

## 2020-02-29 LAB — COMPREHENSIVE METABOLIC PANEL
ALT: 28 IU/L (ref 0–32)
AST: 24 IU/L (ref 0–40)
Albumin/Globulin Ratio: 1.6 (ref 1.2–2.2)
Albumin: 4.7 g/dL (ref 3.8–4.9)
Alkaline Phosphatase: 82 IU/L (ref 48–121)
BUN/Creatinine Ratio: 15 (ref 9–23)
BUN: 9 mg/dL (ref 6–24)
Bilirubin Total: 0.2 mg/dL (ref 0.0–1.2)
CO2: 23 mmol/L (ref 20–29)
Calcium: 9.3 mg/dL (ref 8.7–10.2)
Chloride: 104 mmol/L (ref 96–106)
Creatinine, Ser: 0.62 mg/dL (ref 0.57–1.00)
GFR calc Af Amer: 119 mL/min/{1.73_m2} (ref >59)
GFR calc non Af Amer: 103 mL/min/{1.73_m2} (ref >59)
Globulin, Total: 3 g/dL (ref 1.5–4.5)
Glucose: 122 mg/dL — ABNORMAL HIGH (ref 65–99)
Potassium: 4.3 mmol/L (ref 3.5–5.2)
Sodium: 140 mmol/L (ref 134–144)
Total Protein: 7.7 g/dL (ref 6.0–8.5)

## 2020-02-29 LAB — TSH: TSH: 1.29 u[IU]/mL (ref 0.450–4.500)

## 2020-03-02 ENCOUNTER — Telehealth: Payer: Self-pay

## 2020-03-02 NOTE — Telephone Encounter (Signed)
-----   Message from Birdie Sons, MD sent at 02/29/2020  7:54 AM EDT ----- Slightly elevated blood sugar, but not in diabetic range. Rest of labs are completely normal.

## 2020-03-02 NOTE — Telephone Encounter (Signed)
Pt given lab results per notes of 02/29/20 on 0754. Pt verbalized understanding.

## 2020-03-02 NOTE — Telephone Encounter (Signed)
Left message to call back. Ok for [facility] to give results.

## 2020-03-02 NOTE — Telephone Encounter (Signed)
Called patient and no answer left voicemail message for patient to return call. If patient calls back okay for pEC to advise of message.

## 2020-03-02 NOTE — Telephone Encounter (Signed)
Patient notified in other telephone encounter on 03/02/20.

## 2020-03-04 ENCOUNTER — Other Ambulatory Visit: Payer: Self-pay

## 2020-03-04 DIAGNOSIS — J4540 Moderate persistent asthma, uncomplicated: Secondary | ICD-10-CM

## 2020-03-04 DIAGNOSIS — R300 Dysuria: Secondary | ICD-10-CM

## 2020-03-04 DIAGNOSIS — M7061 Trochanteric bursitis, right hip: Secondary | ICD-10-CM

## 2020-03-04 MED ORDER — BUDESONIDE-FORMOTEROL FUMARATE 160-4.5 MCG/ACT IN AERO: 2.0000 | INHALATION_SPRAY | Freq: Two times a day (BID) | RESPIRATORY_TRACT | 12 refills | Status: DC

## 2020-03-04 MED ORDER — BUDESONIDE-FORMOTEROL FUMARATE 160-4.5 MCG/ACT IN AERO: 2.0000 | INHALATION_SPRAY | Freq: Two times a day (BID) | RESPIRATORY_TRACT | Status: DC

## 2020-03-04 NOTE — Addendum Note (Signed)
Addended by: Randal Buba on: 03/04/2020 02:29 PM   Modules accepted: Orders

## 2020-03-04 NOTE — Progress Notes (Signed)
Patient says that the antibiotic (Keflex), and her pain medication Percocet are not at the pharmacy. I called the pharmacy and spoke with pharmacist Shawn who advised me that they did receive those prescriptions from our office and they are ready for patient to pick up. Shawn also advised me that they received the prescription for her epi pen and that is also ready. I called patient and advised her of this. Patient would like a refill on Symbicort inhaler. I sent in refill for Symbicort and advised patient.

## 2020-03-04 NOTE — Progress Notes (Signed)
Please verify which medications and which pharmacy.

## 2020-03-04 NOTE — Progress Notes (Signed)
Patient states the pharmacy has not received her refills.  She has been twice.  She is going to go again tonight.  Can you resend please?  She also said she checked and does not have any more Symbicort so she needs that sent in as well. Thanks

## 2020-03-16 ENCOUNTER — Ambulatory Visit: Payer: BC Managed Care – PPO

## 2020-06-10 ENCOUNTER — Other Ambulatory Visit: Payer: Self-pay | Admitting: Family Medicine

## 2020-06-10 DIAGNOSIS — J4540 Moderate persistent asthma, uncomplicated: Secondary | ICD-10-CM

## 2020-06-10 NOTE — Telephone Encounter (Signed)
Requested medication (s) are due for refill today: yes  Requested medication (s) are on the active medication list: No  Last refill:  05/28/19  18g  6 refills  Future visit scheduled:No  Notes to clinic:  looks as if patient has been using up refills last refill per pharmacy request 12/03/19  Rx does not appear on med list except to say it has end date of 06/15/19. Please review    Requested Prescriptions  Pending Prescriptions Disp Refills   albuterol (VENTOLIN HFA) 108 (90 Base) MCG/ACT inhaler [Pharmacy Med Name: ALBUTEROL HFA (PROVENTIL) INH] 6.7 each 6    Sig: TAKE 2 PUFFS BY MOUTH EVERY 6 HOURS AS NEEDED      Pulmonology:  Beta Agonists Failed - 06/10/2020  3:47 PM      Failed - One inhaler should last at least one month. If the patient is requesting refills earlier, contact the patient to check for uncontrolled symptoms.      Passed - Valid encounter within last 12 months    Recent Outpatient Visits           3 months ago [place], Donald E, MD   6 months ago Acute non-recurrent pansinusitis   [facility] Flinchum, Kelby Aline, FNP   9 months ago Acute non-recurrent frontal sinusitis   [facility] Birdie Sons, MD   11 months ago Other headache syndrome   [facility] [place] Birdie Sons, MD   1 year ago Concussion without loss of consciousness, subsequent encounter   [facility] Birdie Sons, MD

## 2020-07-13 ENCOUNTER — Ambulatory Visit: Payer: Self-pay | Admitting: *Deleted
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
## 2020-07-28 ENCOUNTER — Encounter: Payer: Self-pay | Admitting: Family Medicine

## 2020-07-28 ENCOUNTER — Ambulatory Visit (INDEPENDENT_AMBULATORY_CARE_PROVIDER_SITE_OTHER): Payer: BC Managed Care – PPO | Admitting: Family Medicine

## 2020-07-28 ENCOUNTER — Other Ambulatory Visit: Payer: Self-pay

## 2020-07-28 VITALS — BP 114/64 | HR 100 | Temp 97.3°F | Resp 16 | Ht 60.0 in | Wt 269.0 lb

## 2020-07-28 DIAGNOSIS — H9201 Otalgia, right ear: Secondary | ICD-10-CM | POA: Diagnosis not present

## 2020-07-28 DIAGNOSIS — F5101 Primary insomnia: Secondary | ICD-10-CM

## 2020-07-28 DIAGNOSIS — M792 Neuralgia and neuritis, unspecified: Secondary | ICD-10-CM

## 2020-07-28 DIAGNOSIS — J4540 Moderate persistent asthma, uncomplicated: Secondary | ICD-10-CM

## 2020-07-28 MED ORDER — GABAPENTIN 300 MG PO CAPS: 300.0000 mg | ORAL_CAPSULE | Freq: Every day | ORAL | 2 refills | Status: DC

## 2020-07-28 MED ORDER — ALBUTEROL SULFATE HFA 108 (90 BASE) MCG/ACT IN AERS: INHALATION_SPRAY | RESPIRATORY_TRACT | 6 refills | Status: DC
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
## 2020-08-05 ENCOUNTER — Telehealth: Payer: Self-pay

## 2020-08-05 DIAGNOSIS — R059 Cough, unspecified: Secondary | ICD-10-CM

## 2020-08-05 DIAGNOSIS — J4540 Moderate persistent asthma, uncomplicated: Secondary | ICD-10-CM

## 2020-08-05 NOTE — Telephone Encounter (Signed)
That's fine, but we need to know reason for getting covid test

## 2020-08-05 NOTE — Telephone Encounter (Signed)
Ok to order covid test for the patient? Please advise. Thanks!

## 2020-08-05 NOTE — Telephone Encounter (Signed)
Copied from [place] [id number]. Topic: General - Other >> Aug 05, 2020  9:59 AM Celene Kras wrote: Reason for CRM: Pt Calling stating that she would like to set up an appt to get a covid test done in office. Pt also states that she has been out of work with her asthma x2 days.  Please advise.

## 2020-08-05 NOTE — Telephone Encounter (Signed)
Patient reports that she has a cough that is worse due to her asthma. Covid test ordered. Advised patient.

## 2020-08-05 NOTE — Telephone Encounter (Signed)
Received FMLA re-certification forms from [place]. Forms placed in Dr. Maralyn Sago box and blank copy on file with medical records. Please advise. Thanks TNP

## 2020-08-06 LAB — SARS-COV-2, NAA 2 DAY TAT

## 2020-08-06 LAB — NOVEL CORONAVIRUS, NAA: SARS-CoV-2, NAA: NOT DETECTED

## 2020-08-07 NOTE — Telephone Encounter (Signed)
-----   Message from Birdie Sons, MD sent at 08/07/2020  6:59 AM EST ----- Covid test is negative.

## 2020-08-10 ENCOUNTER — Telehealth: Payer: Self-pay

## 2020-08-10 NOTE — Telephone Encounter (Signed)
[place] [employer] forms received and placed on Dr. Maralyn Sago desk for review.

## 2020-08-10 NOTE — Telephone Encounter (Signed)
FMLA forms received and place in Dr. Maralyn Sago box. Blank copy on file with medical records. Please advise. Thanks TNP

## 2020-08-10 NOTE — Telephone Encounter (Signed)
Copied from [place] [id number]. Topic: General - Other >> Aug 10, 2020  1:59 PM Leward Quan A wrote: Reason for CRM: Patient called in she is in the process of faxing over paperwork for Dr Caryn Section. Asking that he gets it today please

## 2020-08-12 NOTE — Telephone Encounter (Signed)
FMLA forms faxed 08/12/20. TNP

## 2020-09-28 ENCOUNTER — Ambulatory Visit: Payer: Self-pay

## 2020-09-28 ENCOUNTER — Other Ambulatory Visit: Payer: Self-pay | Admitting: Family Medicine

## 2020-09-28 DIAGNOSIS — H6691 Otitis media, unspecified, right ear: Secondary | ICD-10-CM

## 2020-09-28 DIAGNOSIS — J020 Streptococcal pharyngitis: Secondary | ICD-10-CM

## 2020-09-28 MED ORDER — AMOXICILLIN-POT CLAVULANATE 875-125 MG PO TABS: 1.0000 | ORAL_TABLET | Freq: Two times a day (BID) | ORAL | 0 refills | Status: AC

## 2020-09-28 NOTE — Telephone Encounter (Signed)
No appointments available. Have sent [facility]. If not better in a few days will need to go to Urgent Care.

## 2020-09-28 NOTE — Telephone Encounter (Signed)
Pt. States she started coughing and having sinus drainage Friday. Has yellow mucus. No fever.Has some wheezing. States she feels like she has a sinus infection. History of asthma. Would like to be worked in today - stayed home from worked. Spoke with Elmyra Ricks in the practice and will send triage over. Please advise pt.  Reason for Disposition . [1] Continuous (nonstop) coughing interferes with work or school AND [2] no improvement using cough treatment per Care Advice  Answer Assessment - Initial Assessment Questions 1. ONSET: "When did the cough begin?"      Friday 2. SEVERITY: "How bad is the cough today?"      Moderate 3. SPUTUM: "Describe the color of your sputum" (none, dry cough; clear, white, yellow, green)     Yellow 4. HEMOPTYSIS: "Are you coughing up any blood?" If so ask: "How much?" (flecks, streaks, tablespoons, etc.)     No 5. DIFFICULTY BREATHING: "Are you having difficulty breathing?" If Yes, ask: "How bad is it?" (e.g., mild, moderate, severe)    - MILD: No SOB at rest, mild SOB with walking, speaks normally in sentences, can lay down, no retractions, pulse < 100.    - MODERATE: SOB at rest, SOB with minimal exertion and prefers to sit, cannot lie down flat, speaks in phrases, mild retractions, audible wheezing, pulse 100-120.    - SEVERE: Very SOB at rest, speaks in single words, struggling to breathe, sitting hunched forward, retractions, pulse > 120      Mild 6. FEVER: "Do you have a fever?" If Yes, ask: "What is your temperature, how was it measured, and when did it start?"     No 7. CARDIAC HISTORY: "Do you have any history of heart disease?" (e.g., heart attack, congestive heart failure)      No 8. LUNG HISTORY: "Do you have any history of lung disease?"  (e.g., pulmonary embolus, asthma, emphysema)     Asthma 9. PE RISK FACTORS: "Do you have a history of blood clots?" (or: recent major surgery, recent prolonged travel, bedridden)     No 10. OTHER SYMPTOMS: "Do you have  any other symptoms?" (e.g., runny nose, wheezing, chest pain)       Runny nose, wheezing 11. PREGNANCY: "Is there any chance you are pregnant?" "When was your last menstrual period?"       No 12. TRAVEL: "Have you traveled out of the country in the last month?" (e.g., travel history, exposures)       No  Protocols used: [place]

## 2020-09-29 NOTE — Telephone Encounter (Signed)
I called pt and pt verbalized understanding of information below. Pt also stated that she has been having stomach upset for past two days.

## 2020-10-21 ENCOUNTER — Ambulatory Visit: Payer: Self-pay

## 2020-10-21 NOTE — Telephone Encounter (Signed)
Pt. Reports she started coughing 2 days ago. Coughing up yellow mucus. Cough is moderate to severe. Audible wheezing. Using her nebulizer without relief. Short of breath with walking. States :I think I need a steroid." No availability today. Will have husband take her to UC.  Reason for Disposition . [1] MILD difficulty breathing (e.g., minimal/no SOB at rest, SOB with walking, pulse <100) AND [2] still present when not coughing  Answer Assessment - Initial Assessment Questions 1. ONSET: "When did the cough begin?"      2 days ago 2. SEVERITY: "How bad is the cough today?"      Moderate 3. SPUTUM: "Describe the color of your sputum" (none, dry cough; clear, white, yellow, green)     Yellow 4. HEMOPTYSIS: "Are you coughing up any blood?" If so ask: "How much?" (flecks, streaks, tablespoons, etc.)     No 5. DIFFICULTY BREATHING: "Are you having difficulty breathing?" If Yes, ask: "How bad is it?" (e.g., mild, moderate, severe)    - MILD: No SOB at rest, mild SOB with walking, speaks normally in sentences, can lay down, no retractions, pulse < 100.    - MODERATE: SOB at rest, SOB with minimal exertion and prefers to sit, cannot lie down flat, speaks in phrases, mild retractions, audible wheezing, pulse 100-120.    - SEVERE: Very SOB at rest, speaks in single words, struggling to breathe, sitting hunched forward, retractions, pulse > 120      Moderate 6. FEVER: "Do you have a fever?" If Yes, ask: "What is your temperature, how was it measured, and when did it start?"     No 7. CARDIAC HISTORY: "Do you have any history of heart disease?" (e.g., heart attack, congestive heart failure)      No 8. LUNG HISTORY: "Do you have any history of lung disease?"  (e.g., pulmonary embolus, asthma, emphysema)     Asthma 9. PE RISK FACTORS: "Do you have a history of blood clots?" (or: recent major surgery, recent prolonged travel, bedridden)     No 10. OTHER SYMPTOMS: "Do you have any other symptoms?" (e.g.,  runny nose, wheezing, chest pain)       Wheezing 11. PREGNANCY: "Is there any chance you are pregnant?" "When was your last menstrual period?"       No 12. TRAVEL: "Have you traveled out of the country in the last month?" (e.g., travel history, exposures)       No  Protocols used: [place]

## 2020-11-17 ENCOUNTER — Other Ambulatory Visit: Payer: Self-pay | Admitting: Family Medicine

## 2020-11-17 DIAGNOSIS — J4540 Moderate persistent asthma, uncomplicated: Secondary | ICD-10-CM

## 2020-11-17 DIAGNOSIS — J309 Allergic rhinitis, unspecified: Secondary | ICD-10-CM

## 2020-11-17 DIAGNOSIS — M7061 Trochanteric bursitis, right hip: Secondary | ICD-10-CM

## 2020-11-17 MED ORDER — MOMETASONE FUROATE 50 MCG/ACT NA SUSP: 2.0000 | Freq: Every day | NASAL | 3 refills | Status: DC

## 2020-11-17 MED ORDER — OXYCODONE-ACETAMINOPHEN 7.5-325 MG PO TABS: 1.0000 | ORAL_TABLET | Freq: Four times a day (QID) | ORAL | 0 refills | Status: DC | PRN

## 2020-11-17 NOTE — Telephone Encounter (Signed)
Requested medication (s) are due for refill today:yes  Requested medication (s) are on the active medication list: Percocet yes    ///Nasonex expired 12/07/19  Last refill:  Percocet: 02/28/20            Nasonex: 12/07/18  Future visit scheduled: yes  Notes to clinic:  Percocet: med not delegated to NT to RF                           Nasonex expired   Requested Prescriptions  Pending Prescriptions Disp Refills   oxyCODONE-acetaminophen (PERCOCET) 7.5-325 MG tablet 30 tablet     Sig: Take 1 tablet by mouth every 6 (six) hours as needed for severe pain.      Not Delegated - Analgesics:  Opioid Agonist Combinations Failed - 11/17/2020  1:37 PM      Failed - This refill cannot be delegated      Failed - Urine Drug Screen completed in last 360 days      Passed - Valid encounter within last 6 months    Recent Outpatient Visits           3 months ago Primary insomnia   [facility] Birdie Sons, MD   8 months ago [place], Donald E, MD   12 months ago Acute non-recurrent pansinusitis   [facility] Flinchum, Kelby Aline, FNP   1 year ago Acute non-recurrent frontal sinusitis   [facility] Birdie Sons, MD   1 year ago Other headache syndrome   [place], MD       Future Appointments             In 2 weeks Fisher, Kirstie Peri, MD [facility]               mometasone (NASONEX) 50 MCG/ACT nasal spray 17 g     Sig: Place 2 sprays into the nose daily.      Ear, Nose, and Throat: Nasal Preparations - Corticosteroids Passed - 11/17/2020  1:37 PM      Passed - Valid encounter within last 12 months    Recent Outpatient Visits           3 months ago Primary insomnia   [facility] Birdie Sons, MD   8 months ago [place], Donald E, MD   12 months ago Acute non-recurrent pansinusitis    [facility] Flinchum, Kelby Aline, FNP   1 year ago Acute non-recurrent frontal sinusitis   [facility] Birdie Sons, MD   1 year ago Other headache syndrome   [facility] Birdie Sons, MD       Future Appointments             In 2 weeks Fisher, Kirstie Peri, MD [facility], [facility]              Refused Prescriptions Disp Refills   budesonide-formoterol [facility]) 160-4.5 MCG/ACT inhaler 1 each     Sig: Inhale 2 puffs into the lungs 2 (two) times daily.      Pulmonology:  Combination Products Passed - 11/17/2020  1:37 PM      Passed - Valid encounter within last 12 months    Recent Outpatient Visits           3 months ago Primary insomnia  [place], [place]   8 months ago [place], Donald E, MD   12 months ago Acute non-recurrent pansinusitis   [facility] Flinchum, Kelby Aline, FNP   1 year ago Acute non-recurrent frontal sinusitis   [facility] Birdie Sons, MD   1 year ago Other headache syndrome   [facility] Birdie Sons, MD       Future Appointments             In 2 weeks Fisher, Kirstie Peri, MD [facility], [place]

## 2020-11-17 NOTE — Telephone Encounter (Signed)
Requested Prescriptions  Pending Prescriptions Disp Refills  . oxyCODONE-acetaminophen (PERCOCET) 7.5-325 MG tablet 30 tablet     Sig: Take 1 tablet by mouth every 6 (six) hours as needed for severe pain.     Not Delegated - Analgesics:  Opioid Agonist Combinations Failed - 11/17/2020  1:37 PM      Failed - This refill cannot be delegated      Failed - Urine Drug Screen completed in last 360 days      Passed - Valid encounter within last 6 months    Recent Outpatient Visits          3 months ago Primary insomnia   [facility] Birdie Sons, MD   8 months ago [place], Donald E, MD   12 months ago Acute non-recurrent pansinusitis   [facility] Flinchum, Kelby Aline, FNP   1 year ago Acute non-recurrent frontal sinusitis   [facility] Birdie Sons, MD   1 year ago Other headache syndrome   [place], MD      Future Appointments            In 2 weeks Fisher, Kirstie Peri, MD [facility], [facility]           . mometasone (NASONEX) 50 MCG/ACT nasal spray 17 g     Sig: Place 2 sprays into the nose daily.     Ear, Nose, and Throat: Nasal Preparations - Corticosteroids Passed - 11/17/2020  1:37 PM      Passed - Valid encounter within last 12 months    Recent Outpatient Visits          3 months ago Primary insomnia   [facility] Birdie Sons, MD   8 months ago [place], Donald E, MD   12 months ago Acute non-recurrent pansinusitis   [facility] Flinchum, Kelby Aline, FNP   1 year ago Acute non-recurrent frontal sinusitis   [facility] Birdie Sons, MD   1 year ago Other headache syndrome   [facility] Birdie Sons, MD      Future Appointments            In 2 weeks Fisher, Kirstie Peri, MD [facility]           .  budesonide-formoterol [facility]) 160-4.5 MCG/ACT inhaler 1 each     Sig: Inhale 2 puffs into the lungs 2 (two) times daily.     Pulmonology:  Combination Products Passed - 11/17/2020  1:37 PM      Passed - Valid encounter within last 12 months    Recent Outpatient Visits          3 months ago Primary insomnia   [facility] Birdie Sons, MD   8 months ago [place], Donald E, MD   12 months ago Acute non-recurrent pansinusitis   [facility] Flinchum, Kelby Aline, FNP   1 year ago Acute non-recurrent frontal sinusitis   [facility] Birdie Sons, MD   1 year ago Other headache syndrome   [facility] Birdie Sons, MD      Future Appointments            In 2 weeks Fisher, Kirstie Peri, MD [facility], [place]

## 2020-11-17 NOTE — Telephone Encounter (Signed)
Copied from [place] [id number]. Topic: Quick Communication - Rx Refill/Question >> Nov 17, 2020  1:22 PM Leward Quan A wrote: Medication: oxyCODONE-acetaminophen (PERCOCET) 7.5-325 MG tablet, mometasone (NASONEX) 50 MCG/ACT nasal spray,budesonide-formoterol (SYMBICORT) 160-4.5 MCG/ACT inhaler  Has the patient contacted their pharmacy? Yes.   (Agent: If no, request that the patient contact the pharmacy for the refill.) (Agent: If yes, when and what did the pharmacy advise?)  Preferred Pharmacy (with phone number or street name): [id number] - [place], [address]  Phone:  [phone] Fax:  [phone]     Agent: Please be advised that RX refills may take up to 3 business days. We ask that you follow-up with your pharmacy.

## 2020-12-04 ENCOUNTER — Ambulatory Visit: Payer: BC Managed Care – PPO | Admitting: Family Medicine

## 2020-12-04 ENCOUNTER — Encounter: Payer: Self-pay | Admitting: Family Medicine

## 2020-12-04 ENCOUNTER — Other Ambulatory Visit: Payer: Self-pay

## 2020-12-04 VITALS — BP 128/74 | HR 100 | Wt 272.0 lb

## 2020-12-04 DIAGNOSIS — R110 Nausea: Secondary | ICD-10-CM

## 2020-12-04 DIAGNOSIS — M7061 Trochanteric bursitis, right hip: Secondary | ICD-10-CM | POA: Diagnosis not present

## 2020-12-04 DIAGNOSIS — R591 Generalized enlarged lymph nodes: Secondary | ICD-10-CM

## 2020-12-04 DIAGNOSIS — M5416 Radiculopathy, lumbar region: Secondary | ICD-10-CM | POA: Diagnosis not present

## 2020-12-04 MED ORDER — AMOXICILLIN 500 MG PO CAPS: 1000.0000 mg | ORAL_CAPSULE | Freq: Three times a day (TID) | ORAL | 0 refills | Status: AC

## 2020-12-04 MED ORDER — PROMETHAZINE HCL 25 MG PO TABS: 25.0000 mg | ORAL_TABLET | Freq: Three times a day (TID) | ORAL | 0 refills | Status: DC | PRN

## 2020-12-04 MED ORDER — OXYCODONE-ACETAMINOPHEN 7.5-325 MG PO TABS: 1.0000 | ORAL_TABLET | Freq: Four times a day (QID) | ORAL | 0 refills | Status: DC | PRN

## 2020-12-04 MED ORDER — PREDNISONE 10 MG PO TABS: ORAL_TABLET | ORAL | 0 refills | Status: DC

## 2020-12-04 NOTE — Progress Notes (Signed)
Established patient visit   Patient: Erin Sherman   DOB: 02-Feb-1966   55 y.o. Female  MRN: [id number] Visit Date: 12/04/2020  Today's healthcare provider: Lelon Huh, MD   Chief Complaint  Patient presents with   Leg Pain   Sinusitis   Subjective    Leg Pain  The incident occurred more than 1 week ago. There was no injury mechanism. The pain is present in the left leg. Quality: Sharp pain. Associated symptoms include numbness. Pertinent negatives include no inability to bear weight, loss of motion, loss of sensation, muscle weakness or tingling. Nothing aggravates the symptoms. She has tried acetaminophen and NSAIDs for the symptoms. The treatment provided mild relief.  Sinusitis This is a new problem. The current episode started in the past 7 days. The problem has been gradually worsening since onset. There has been no fever. Associated symptoms include congestion, ear pain, sinus pressure and a sore throat. Pertinent negatives include no headaches or sneezing.    Also having persistent hip pain in both hips. 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   calcium carbonate (TUMS - DOSED IN MG ELEMENTAL CALCIUM) 500 MG chewable tablet Chew 1 tablet by mouth daily.   cholecalciferol (VITAMIN D) 1000 UNITS tablet Take 1,000 Units by mouth daily.   ELDERBERRY PO Take by mouth.   EPINEPHrine (EPIPEN 2-PAK) 0.3 mg/0.3 mL IJ SOAJ injection Inject 0.3 mLs (0.3 mg total) into the muscle as needed. With allergic reaction   ipratropium-albuterol (DUONEB) 0.5-2.5 (3) MG/3ML SOLN Inhale 3 mLs into the lungs every 6 (six) hours as needed. Reported on 12/28/2015   levalbuterol (XOPENEX HFA) 45 MCG/ACT inhaler Inhale 2 puffs into the lungs every 6 (six) hours as needed for wheezing. May substitute  generic   mometasone (NASONEX) 50 MCG/ACT nasal spray Place 2 sprays into the nose daily.   Spacer/Aero-Holding Chambers (OPTICHAMBER ADVANTAGE) MISC 1 each by Other route once. Always uses her when you're using a metered-dose inhaler. You've aromatase medicine as much, he won't have his much side effect, but you it twice as much medicine and your lungs.   ZYRTEC-D ALLERGY & CONGESTION 5-120 MG tablet TAKE 1 TABLET BY MOUTH DAILY. AS NEEDED FOR ALLERGIES CAN RAISE BLOOD PRESSURE   albuterol (VENTOLIN HFA) 108 (90 Base) MCG/ACT inhaler TAKE 2 PUFFS BY MOUTH EVERY 6 HOURS AS NEEDED (Patient not taking: Reported on 12/04/2020)   gabapentin (NEURONTIN) 300 MG capsule Take 1 capsule (300 mg total) by mouth at bedtime.   oxyCODONE-acetaminophen (PERCOCET) 7.5-325 MG tablet Take 1 tablet by mouth every 6 (six) hours as needed for severe pain.   promethazine (PHENERGAN) 25 MG tablet Take 1 tablet (25 mg total) by mouth every 8 (eight) hours as needed for nausea. (Patient not taking: No sig reported)   triamcinolone cream (KENALOG) 0.5 % Apply 1 application topically 3 (three) times daily. (Patient not taking: No sig reported)   No facility-administered medications prior to visit.    Review of Systems  HENT: Positive for congestion, ear pain, postnasal drip, sinus pressure, sinus pain and sore throat. Negative for ear discharge, rhinorrhea, sneezing, tinnitus, trouble swallowing and voice change.   Eyes: Positive for discharge and itching. Negative for photophobia, pain, redness and visual disturbance.  Respiratory: Negative.   Cardiovascular: Negative.   Gastrointestinal: Negative.  Neurological: Positive for numbness. Negative for dizziness, tingling, light-headedness and headaches.       Objective    BP 128/74 (BP Location: Right Arm, Patient Position: Sitting, Cuff Size: Large)   Pulse 100   Wt 272 lb (123.4 kg)   SpO2 98%   BMI 53.12 kg/m     Physical Exam  General Appearance:    Obese  female, alert, cooperative, in no acute distress  HENT:   Tender frontal sinuses.   Eyes:    PERRL, conjunctiva/corneas clear, EOM's intact       Lungs:     Clear to auscultation bilaterally, respirations unlabored  Heart:    Tachycardic. Normal rhythm. No murmurs, rubs, or gallops.   Neurologic:   Awake, alert, oriented x 3. No apparent focal neurological           defect.         Assessment & Plan     1. Lumbar radiculitis  - predniSONE (DELTASONE) 10 MG tablet; 6 tablets for 1 day, then 5 for 1 day, then 4 for 1 day, then 3 for 1 day, then 2 for 1 day then 1 daily  Dispense: 30 tablet; Refill: 0  2. Trochanteric bursitis of right hip  - oxyCODONE-acetaminophen (PERCOCET) 7.5-325 MG tablet; Take 1 tablet by mouth every 6 (six) hours as needed for severe pain.  Dispense: 30 tablet; Refill: 0  3. Lymphadenopathy  - amoxicillin (AMOXIL) 500 MG capsule; Take 2 capsules (1,000 mg total) by mouth 3 (three) times daily for 5 days.  Dispense: 30 capsule; Refill: 0  4. Nausea refill- promethazine (PHENERGAN) 25 MG tablet; Take 1 tablet (25 mg total) by mouth every 8 (eight) hours as needed for nausea.  Dispense: 20 tablet; Refill: 0         The entirety of the information documented in the History of Present Illness, Review of Systems and Physical Exam were personally obtained by me. Portions of this information were initially documented by the CMA and reviewed by me for thoroughness and accuracy.     Lelon Huh, MD  [facility] [phone] (phone) [phone] (fax)  [place]

## 2021-01-17 ENCOUNTER — Emergency Department
Admission: EM | Admit: 2021-01-17 | Discharge: 2021-01-18 | Disposition: A | Discharge status: Home / Self Care | Payer: BC Managed Care – PPO | Attending: Emergency Medicine | Admitting: Emergency Medicine

## 2021-01-17 ENCOUNTER — Other Ambulatory Visit: Payer: Self-pay

## 2021-01-17 ENCOUNTER — Emergency Department: Payer: BC Managed Care – PPO

## 2021-01-17 DIAGNOSIS — R0789 Other chest pain: Secondary | ICD-10-CM

## 2021-01-17 DIAGNOSIS — J45909 Unspecified asthma, uncomplicated: Secondary | ICD-10-CM | POA: Diagnosis not present

## 2021-01-17 DIAGNOSIS — R531 Weakness: Secondary | ICD-10-CM | POA: Diagnosis present

## 2021-01-17 DIAGNOSIS — R42 Dizziness and giddiness: Secondary | ICD-10-CM | POA: Diagnosis not present

## 2021-01-17 DIAGNOSIS — R61 Generalized hyperhidrosis: Secondary | ICD-10-CM | POA: Diagnosis not present

## 2021-01-17 DIAGNOSIS — Z7951 Long term (current) use of inhaled steroids: Secondary | ICD-10-CM | POA: Diagnosis not present

## 2021-01-17 DIAGNOSIS — Z87891 Personal history of nicotine dependence: Secondary | ICD-10-CM | POA: Insufficient documentation

## 2021-01-17 DIAGNOSIS — Z91040 Latex allergy status: Secondary | ICD-10-CM | POA: Insufficient documentation

## 2021-01-17 DIAGNOSIS — Z8541 Personal history of malignant neoplasm of cervix uteri: Secondary | ICD-10-CM | POA: Diagnosis not present

## 2021-01-17 DIAGNOSIS — R29898 Other symptoms and signs involving the musculoskeletal system: Secondary | ICD-10-CM

## 2021-01-17 LAB — BASIC METABOLIC PANEL
Anion gap: 7 (ref 5–15)
BUN: 7 mg/dL (ref 6–20)
CO2: 23 mmol/L (ref 22–32)
Calcium: 9 mg/dL (ref 8.9–10.3)
Chloride: 106 mmol/L (ref 98–111)
Creatinine, Ser: 0.67 mg/dL (ref 0.44–1.00)
GFR, Estimated: >60 mL/min (ref >60)
Glucose, Bld: 99 mg/dL (ref 70–99)
Potassium: 3.4 mmol/L — ABNORMAL LOW (ref 3.5–5.1)
Sodium: 136 mmol/L (ref 135–145)

## 2021-01-17 LAB — CBC
HCT: 30.3 % — ABNORMAL LOW (ref 36.0–46.0)
Hemoglobin: 9.3 g/dL — ABNORMAL LOW (ref 12.0–15.0)
MCH: 21.9 pg — ABNORMAL LOW (ref 26.0–34.0)
MCHC: 30.7 g/dL (ref 30.0–36.0)
MCV: 71.3 fL — ABNORMAL LOW (ref 80.0–100.0)
Platelets: 333 10*3/uL (ref 150–400)
RBC: 4.25 MIL/uL (ref 3.87–5.11)
RDW: 16.5 % — ABNORMAL HIGH (ref 11.5–15.5)
WBC: 8.6 10*3/uL (ref 4.0–10.5)
nRBC: 0 % (ref 0.0–0.2)

## 2021-01-17 LAB — TROPONIN I (HIGH SENSITIVITY)
Troponin I (High Sensitivity): 3 ng/L (ref <18)
Troponin I (High Sensitivity): 4 ng/L (ref <18)

## 2021-01-17 NOTE — ED Triage Notes (Signed)
Pt in via EMS from home with c/o HA intermittent since yesterday that went away with tylenol. Today pt reported sharp pain to chest that went away and some dizziness. Pt did not want to come but family insisted.

## 2021-01-17 NOTE — ED Provider Notes (Signed)
[facility] Emergency Department Provider Note ____________________________________________   Event Date/Time   First MD Initiated Contact with Patient 01/17/21 1941     (approximate)  I have reviewed the triage vital signs and the nursing notes.   HISTORY  Chief Complaint Chest Pain    HPI Erin Sherman is a 55 y.o. female with PMH as noted below including a remote history of MI (with no stents) migraine, and stroke who presents with chest pain, substernal, acute onset this afternoon, described as stabbing in quality, radiating to her left arm, and associated with lightheadedness and sweating.  She states that this lasted about 30 minutes.  She was just sitting when it started.  She reports that it feels similar to when she had a heart attack many years ago.  She is not having any pain currently.  In addition the patient states that she noted pain and weakness to the left arm since yesterday.  She states that she was unable to lift weight with the left arm that she normally is able to.  She denies any weakness in her legs or other extremities.  She states that every time she tries to lift something with that arm it is painful.  She states it no longer feels weak.  In addition the patient reports a right-sided headache yesterday which has subsequently resolved.  She has had similar headaches ever since she had a concussion last year.  Past Medical History:  Diagnosis Date   Cervical cancer ([place])    Hx of bronchitis    MI (myocardial infarction) ([place])    Migraines    Stomach ulcer    Stroke [facility])     Patient Active Problem List   Diagnosis Date Noted   Asthma, chronic, moderate persistent, uncomplicated 47/42/5956   Acute non-recurrent pansinusitis 11/20/2019   Headache disorder 09/26/2019   Difficulty sleeping 08/09/2019   Photophobia 08/09/2019   Hyperplastic polyp of sigmoid colon 11/29/2016   Polyp of sigmoid colon    Itching 11/17/2015    Trochanteric bursitis of right hip 07/01/2015   Low back pain 07/01/2015   DDD (degenerative disc disease), lumbar 05/15/2015   Carpal tunnel syndrome 05/07/2015   Hip pain 05/07/2015   History of cervical cancer 05/07/2015   HLD (hyperlipidemia) 05/07/2015   Menopausal symptom 05/07/2015   Obesity 05/07/2015   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 Rica Mast, MD (Orthopedic, [facility])   CESAREAN SECTION     COLONOSCOPY WITH PROPOFOL N/A 11/29/2016   Procedure: COLONOSCOPY WITH PROPOFOL;  Surgeon: [name], MD;  Location[facility] ENDOSCOPY;  Service: Endoscopy;  Laterality: N/A;   exercise treadmill Test  05/31/2006   Normal   TUBAL LIGATION      Prior to Admission medications   Medication Sig Start Date End Date Taking? Authorizing Provider  albuterol (VENTOLIN HFA) 108 (90 Base) MCG/ACT inhaler TAKE 2 PUFFS BY MOUTH EVERY 6 HOURS AS NEEDED Patient not taking: Reported on 12/04/2020 07/28/20   Birdie Sons, MD  budesonide-formoterol [facility]) 160-4.5 MCG/ACT inhaler Inhale 2 puffs into the  lungs 2 (two) times daily. 03/04/20   Birdie Sons, MD  calcium carbonate (TUMS - DOSED IN MG ELEMENTAL CALCIUM) 500 MG chewable tablet Chew 1 tablet by mouth daily.    [provider]  cholecalciferol (VITAMIN D) 1000 UNITS tablet Take 1,000 Units by mouth daily.    [provider]  ELDERBERRY PO Take by mouth.    [provider]  EPINEPHrine (EPIPEN 2-PAK) 0.3 mg/0.3 mL IJ SOAJ injection Inject 0.3 mLs (0.3 mg total) into the muscle as needed.  With allergic reaction 02/28/20   Birdie Sons, MD  gabapentin (NEURONTIN) 300 MG capsule Take 1 capsule (300 mg total) by mouth at bedtime. 07/28/20   Birdie Sons, MD  ipratropium-albuterol (DUONEB) 0.5-2.5 (3) MG/3ML SOLN Inhale 3 mLs into the lungs every 6 (six) hours as needed. Reported on 12/28/2015 12/28/15   Mar Daring, PA-C  levalbuterol [facility] HFA) 45 MCG/ACT inhaler Inhale 2 puffs into the lungs every 6 (six) hours as needed for wheezing. May substitute generic 06/15/19   Birdie Sons, MD  mometasone (NASONEX) 50 MCG/ACT nasal spray Place 2 sprays into the nose daily. 11/17/20 11/17/21  Birdie Sons, MD  oxyCODONE-acetaminophen (PERCOCET) 7.5-325 MG tablet Take 1 tablet by mouth every 6 (six) hours as needed for severe pain. 12/04/20   Birdie Sons, MD  promethazine (PHENERGAN) 25 MG tablet Take 1 tablet (25 mg total) by mouth every 8 (eight) hours as needed for nausea. 12/04/20   Birdie Sons, MD  Spacer/Aero-Holding [facility] ADVANTAGE) MISC 1 each by Other route once. Always uses her when you're using a metered-dose inhaler. You've aromatase medicine as much, he won't have his much side effect, but you it twice as much medicine and your lungs. 05/24/15   Ahmed Prima, MD  triamcinolone cream (KENALOG) 0.5 % Apply 1 application topically 3 (three) times daily. Patient not taking: No sig reported 06/11/19   Birdie Sons, MD  ZYRTEC-D ALLERGY & CONGESTION 5-120 MG tablet TAKE 1 TABLET BY MOUTH DAILY. AS NEEDED FOR ALLERGIES CAN RAISE BLOOD PRESSURE 01/02/20   Birdie Sons, MD    Allergies Latex, Sulfa antibiotics, and Meperidine  Family History  Problem Relation Age of Onset   Emphysema Mother    Asthma Mother    Diabetes Mother        type 2   COPD Mother    Hypertension Mother    Emphysema Father    Hypertension Father    COPD Father    Colon cancer Father        died of colon cancer 2017/07/30   Asthma Sister    Asthma Brother     Heart attack Brother    Heart attack Maternal Grandmother    Heart attack Paternal Grandmother    Anemia Sister    Asthma Son    Cancer Maternal Grandfather        prostate   Cancer Maternal Aunt        breast   Cancer Other     Social History Social History   Tobacco Use   Smoking status: Former    Packs/day: 0.30    Years: 25.00    Pack years: 7.50    Types: Cigarettes    Quit date: 07/16/2008    Years since quitting: 12.5   Smokeless tobacco: Never  Substance Use Topics   Alcohol use: Yes    Comment: occasional beer.   Drug use: No  Review of Systems  Constitutional: No fever. Eyes: No visual changes. ENT: No neck pain. Cardiovascular: Positive for resolved chest pain. Respiratory: Denies shortness of breath. Gastrointestinal: No vomiting or diarrhea.  Genitourinary: Negative for dysuria.  Musculoskeletal: Negative for back pain. Skin: Negative for rash. Neurological: Positive for resolved headache.  Positive for resolved left arm weakness.   ____________________________________________   PHYSICAL EXAM:  VITAL SIGNS: ED Triage Vitals  Enc Vitals Group     BP 01/17/21 1815 (!) 156/110     Pulse Rate 01/17/21 1815 87     Resp 01/17/21 1815 18     Temp 01/17/21 1815 97.8 F (36.6 C)     Temp src --      SpO2 01/17/21 1815 97 %     Weight --      Height --      Head Circumference --      Peak Flow --      Pain Score 01/17/21 1814 8     Pain Loc --      Pain Edu? --      Excl. in [place]? --     Constitutional: Alert and oriented. Well appearing and in no acute distress. Eyes: Conjunctivae are normal.  Head: Atraumatic. Nose: No congestion/rhinnorhea. Mouth/Throat: Mucous membranes are moist.   Neck: Normal range of motion.  Cardiovascular: Normal rate, regular rhythm. Grossly normal heart sounds.  Good peripheral circulation. Respiratory: Normal respiratory effort.  No retractions. Lungs CTAB. Gastrointestinal: No distention.   Musculoskeletal: No lower extremity edema.  No calf or popliteal swelling or tenderness.  Extremities warm and well perfused.  Neurologic:  Normal speech and language.  5/5 motor strength and intact sensation to all extremities.  Normal coordination with no ataxia on finger-to-nose.  No pronator drift.  Cranial nerves III through XII intact.   Skin:  Skin is warm and dry. No rash noted. Psychiatric: Mood and affect are normal. Speech and behavior are normal.  ____________________________________________   LABS (all labs ordered are listed, but only abnormal results are displayed)  Labs Reviewed  BASIC METABOLIC PANEL - Abnormal; Notable for the following components:      Result Value   Potassium 3.4 (*)    All other components within normal limits  CBC - Abnormal; Notable for the following components:   Hemoglobin 9.3 (*)    HCT 30.3 (*)    MCV 71.3 (*)    MCH 21.9 (*)    RDW 16.5 (*)    All other components within normal limits  TROPONIN I (HIGH SENSITIVITY)  TROPONIN I (HIGH SENSITIVITY)   ____________________________________________  EKG  ED ECG REPORT I, Arta Silence, the attending physician, personally viewed and interpreted this ECG.  Date: 01/17/2021 EKG Time: 1819 Rate: 86 Rhythm: normal sinus rhythm QRS Axis: normal Intervals: normal ST/T Wave abnormalities: normal Narrative Interpretation: no evidence of acute ischemia  ____________________________________________  RADIOLOGY  Chest x-ray interpreted by me shows no focal consolidation or edema CT head: No acute abnormality  ____________________________________________   PROCEDURES  Procedure(s) performed: No  Procedures  Critical Care performed: No ____________________________________________   INITIAL IMPRESSION / ASSESSMENT AND PLAN / ED COURSE  Pertinent labs & imaging results that were available during my care of the patient were reviewed by me and considered in my medical decision  making (see chart for details).   55 year old female with PMH as noted above including MI, migraine, and stroke presents with resolved atypical chest pain that lasted for about 30 minutes today, and  also reports left arm weakness since yesterday which is associated with pain to the arm and is also now resolved.  I reviewed the past medical records in [place].  The patient has no recent ED visits or admissions since it was in November 2020 for head injury.  On exam currently the patient is overall well-appearing.  Her vital signs are normal.  The physical exam is unremarkable.  Neurologic exam is currently nonfocal.  There is no perceptible left upper extremity weakness or ataxia and no decreased sensation.  1.  Chest pain: The chest pain is resolved and atypical.  EKG is normal.  The patient is at somewhat increased risk given the apparent prior history of MI although she does not have any stents and does not follow with a cardiologist.  It does not appear that she has any recent history of CAD.  There is no clinical evidence for PE or for aortic dissection or other vascular cause given that the pain has resolved and the patient is overall well-appearing with normal vitals.  We will obtain troponins x2 to rule out ACS.  2.  Left upper extremity weakness: This is also somewhat atypical and has been associated with pain the whole time, so I suspect most likely muscular pain or radiculopathy.  However the patient's symptoms could be consistent with a TIA.  We will obtain CT head and reassess.  ----------------------------------------- 11:14 PM on 01/17/2021 -----------------------------------------  Troponins are negative x2.  The patient has had no further chest pain.  The CT head is also negative for acute findings.  I discussed with the patient the possibility of a TIA.  She has a strong desire to go home if at all possible.  I recommended that we at least perform an MRI to further evaluate for any  subtle stroke findings, and the patient agreed.  If this is negative, plan will be discharged home and the patient agrees to follow-up closely with her PMD.  I signed the patient out to the oncoming ED physician Dr. Karma Greaser.   ____________________________________________   FINAL CLINICAL IMPRESSION(S) / ED DIAGNOSES  Final diagnoses:  Atypical chest pain  Left arm weakness      NEW MEDICATIONS STARTED DURING THIS VISIT:  New Prescriptions   No medications on file     Note:  This document was prepared using Dragon voice recognition software and may include unintentional dictation errors.    Arta Silence, MD 01/17/21 2315

## 2021-01-17 NOTE — ED Notes (Signed)
Pt ambulatory to RR at this time.

## 2021-01-17 NOTE — ED Notes (Signed)
ED Provider at bedside. 

## 2021-01-17 NOTE — ED Triage Notes (Signed)
Pt comes with c/o CP and left arm pain. Pt states intense headache. Pt states dizziness. Pt states pain when she tries to raise her left arm.

## 2021-01-17 NOTE — ED Provider Notes (Signed)
-----------------------------------------   10:58 PM on 01/17/2021 -----------------------------------------  Assuming care from Dr. Cherylann Banas.  In short, Erin Sherman is a 55 y.o. female with a chief complaint of chest pain and left arm weakness (now resolved).  Refer to the original H&P for additional details.  The current plan of care is to follow-up on the results of the MRI brain.  Anticipate discharge if reassuring imaging.   ----------------------------------------- 1:58 AM on 01/18/2021 -----------------------------------------  MRI shows no acute abnormalities.  Patient is very ready to go with no recurrent symptoms at this time.  I gave my usual and customary follow-up recommendations and return precautions.  I reviewed her lab results which are all reassuring as well.  No focal neurological deficits, no chest pain.   Hinda Kehr, MD 01/18/21 [phone]

## 2021-01-18 NOTE — Discharge Instructions (Addendum)
Your workup in the Emergency Department today was reassuring.  We did not find any specific abnormalities.  We recommend you drink plenty of fluids, take your regular medications and/or any new ones prescribed today, and follow up with the doctor(s) listed in these documents as recommended.  Return to the Emergency Department if you develop new or worsening symptoms that concern you.  

## 2021-01-18 NOTE — ED Notes (Signed)
Patient transported to MRI 

## 2021-01-18 NOTE — ED Notes (Signed)
ED Provider at bedside. 

## 2021-01-19 ENCOUNTER — Other Ambulatory Visit: Payer: Self-pay

## 2021-01-19 ENCOUNTER — Encounter: Payer: Self-pay | Admitting: Family Medicine

## 2021-01-19 ENCOUNTER — Ambulatory Visit: Payer: BC Managed Care – PPO | Admitting: Family Medicine

## 2021-01-19 VITALS — BP 126/72 | HR 91 | Temp 98.1°F | Resp 18 | Wt 273.0 lb

## 2021-01-19 DIAGNOSIS — R079 Chest pain, unspecified: Secondary | ICD-10-CM

## 2021-01-19 DIAGNOSIS — R200 Anesthesia of skin: Secondary | ICD-10-CM | POA: Diagnosis not present

## 2021-01-19 DIAGNOSIS — M778 Other enthesopathies, not elsewhere classified: Secondary | ICD-10-CM | POA: Diagnosis not present

## 2021-01-19 DIAGNOSIS — R202 Paresthesia of skin: Secondary | ICD-10-CM

## 2021-01-19 DIAGNOSIS — Z8249 Family history of ischemic heart disease and other diseases of the circulatory system: Secondary | ICD-10-CM

## 2021-01-19 MED ORDER — ASPIRIN 81 MG PO TBEC: 81.0000 mg | DELAYED_RELEASE_TABLET | Freq: Every day | ORAL | Status: DC

## 2021-01-19 NOTE — Progress Notes (Signed)
Established patient visit   Patient: Erin Sherman   DOB: 02-22-1966   55 y.o. Female  MRN: [id number] Visit Date: 01/19/2021  Today's healthcare provider: Lelon Huh, MD   Chief Complaint  Patient presents with   Follow-up    Subjective    HPI  Follow up ER visit  Patient was seen in ER for atypical chest pain and left arm weakness on 01/17/2021. Treatment for this included ordering cardiac enzymes, EKG, head CT and brain MRI which were non--diagnostic.She reports good compliance with treatment. She reports this condition is Improved. Patient still has some soreness and weakness in her left arm.   She does report remote of "small heart attack" about 20 years ago not requiring any invasive intervention. She did take a statin after words, but stopped due to myalgias.  -----------------------------------------------------------------------------------------  Family History  Problem Relation Age of Onset   Emphysema Mother    Asthma Mother    Diabetes Mother        type 2   COPD Mother    Hypertension Mother    Emphysema Father    Hypertension Father    COPD Father    Colon cancer Father        died of colon cancer 11-Aug-2017   Asthma Sister    Asthma Brother    Heart attack Brother    Heart attack Maternal Grandmother    Heart attack Paternal Grandmother    Anemia Sister    Asthma Son    Cancer Maternal Grandfather        prostate   Cancer Maternal Aunt        breast   Cancer Other          Medications: Outpatient Medications Prior to Visit  Medication Sig   albuterol (VENTOLIN HFA) 108 (90 Base) MCG/ACT inhaler TAKE 2 PUFFS BY MOUTH EVERY 6 HOURS AS NEEDED   calcium carbonate (TUMS - DOSED IN MG ELEMENTAL CALCIUM) 500 MG chewable tablet Chew 1 tablet by mouth daily.   cholecalciferol (VITAMIN D) 1000 UNITS tablet Take 1,000 Units by mouth daily.   EPINEPHrine (EPIPEN 2-PAK) 0.3 mg/0.3 mL IJ SOAJ injection Inject 0.3 mLs (0.3 mg total) into the  muscle as needed. With allergic reaction   ipratropium-albuterol (DUONEB) 0.5-2.5 (3) MG/3ML SOLN Inhale 3 mLs into the lungs every 6 (six) hours as needed. Reported on 12/28/2015   levalbuterol (XOPENEX HFA) 45 MCG/ACT inhaler Inhale 2 puffs into the lungs every 6 (six) hours as needed for wheezing. May substitute generic   mometasone (NASONEX) 50 MCG/ACT nasal spray Place 2 sprays into the nose daily.   oxyCODONE-acetaminophen (PERCOCET) 7.5-325 MG tablet Take 1 tablet by mouth every 6 (six) hours as needed for severe pain.   promethazine (PHENERGAN) 25 MG tablet Take 1 tablet (25 mg total) by mouth every 8 (eight) hours as needed for nausea.   Spacer/Aero-Holding Chambers (OPTICHAMBER ADVANTAGE) MISC 1 each by Other route once. Always uses her when you're using a metered-dose inhaler. You've aromatase medicine as much, he won't have his much side effect, but you it twice as much medicine and your lungs.   triamcinolone cream (KENALOG) 0.5 % Apply 1 application topically 3 (three) times daily.   ZYRTEC-D ALLERGY & CONGESTION 5-120 MG tablet TAKE 1 TABLET BY MOUTH DAILY. AS NEEDED FOR ALLERGIES CAN RAISE BLOOD PRESSURE   gabapentin (NEURONTIN) 300 MG capsule Take 1 capsule (300 mg total) by mouth at bedtime. (Patient not taking: Reported on  01/19/2021)   [DISCONTINUED] budesonide-formoterol (SYMBICORT) 160-4.5 MCG/ACT inhaler Inhale 2 puffs into the lungs 2 (two) times daily. (Patient not taking: Reported on 01/19/2021)   [DISCONTINUED] ELDERBERRY PO Take by mouth. (Patient not taking: Reported on 01/19/2021)   No facility-administered medications prior to visit.    Review of Systems  Constitutional:  Negative for appetite change, chills, fatigue and fever.  Respiratory:  Negative for chest tightness and shortness of breath.   Cardiovascular:  Negative for chest pain and palpitations.  Gastrointestinal:  Negative for abdominal pain, nausea and vomiting.  Musculoskeletal:  Positive for myalgias  (left arm).  Neurological:  Positive for weakness (left arm). Negative for dizziness.      Objective    BP 126/72 (BP Location: Right Arm, Patient Position: Sitting, Cuff Size: Large)   Pulse 91   Temp 98.1 F (36.7 C) (Temporal)   Resp 18   Wt 273 lb (123.8 kg)   BMI 53.32 kg/m     Physical Exam   General: Appearance:    Severely obese female in no acute distress  Eyes:    PERRL, conjunctiva/corneas clear, EOM's intact       Lungs:     Clear to auscultation bilaterally, respirations unlabored  Heart:    Normal heart rate. Normal rhythm. No murmurs, rubs, or gallops.    MS:   All extremities are intact.    Neurologic:   Awake, alert, oriented x 3. No apparent focal neurological defect.         Assessment & Plan     1. Chest pain, unspecified type Unremarkable work up in ER, but multiple cardiac risk factors.  - Ambulatory referral to Cardiology start aspirin 81 MG EC tablet; Take 1 tablet (81 mg total) by mouth daily. Swallow whole.  See if we can add lipid panel to labs drawn in ER on 01-17-2021. Advised she may need to try another statin.   2. Numbness and tingling in left arm Cardiac versus TIA - US Carotid Bilateral; Future  3. Family history of early CAD   84. Tendinitis of left hand Recommend OTC Voltaren gel. Consider orthopedic referral.       The entirety of the information documented in the History of Present Illness, Review of Systems and Physical Exam were personally obtained by me. Portions of this information were initially documented by the CMA and reviewed by me for thoroughness and accuracy.     Lelon Huh, MD  [facility] [phone] (phone) [phone] (fax)  [place]

## 2021-01-19 NOTE — Patient Instructions (Addendum)
Please review the attached list of medications and notify my office if there are any errors.   Please bring all of your medications to every appointment so we can make sure that our medication list is the same as yours.   Start taking 81mg  coated aspirin once a day

## 2021-02-10 ENCOUNTER — Encounter: Payer: Self-pay | Admitting: Internal Medicine

## 2021-02-10 ENCOUNTER — Ambulatory Visit: Payer: BC Managed Care – PPO | Admitting: Internal Medicine

## 2021-02-10 ENCOUNTER — Other Ambulatory Visit: Payer: Self-pay

## 2021-02-10 VITALS — BP 120/68 | HR 103 | Ht 60.0 in | Wt 272.0 lb

## 2021-02-10 DIAGNOSIS — R29898 Other symptoms and signs involving the musculoskeletal system: Secondary | ICD-10-CM | POA: Diagnosis not present

## 2021-02-10 DIAGNOSIS — D509 Iron deficiency anemia, unspecified: Secondary | ICD-10-CM | POA: Diagnosis not present

## 2021-02-10 DIAGNOSIS — R079 Chest pain, unspecified: Secondary | ICD-10-CM | POA: Diagnosis not present

## 2021-02-10 NOTE — Patient Instructions (Addendum)
Medication Instructions:   Your physician recommends that you continue on your current medications as directed. Please refer to the Current Medication list given to you today.  *If you need a refill on your cardiac medications before your next appointment, please call your pharmacy*   Lab Work:  None ordered  Testing/Procedures:  1) Your physician has requested that you have an echocardiogram. Echocardiography is a painless test that uses sound waves to create images of your heart. It provides your doctor with information about the size and shape of your heart and how well your heart's chambers and valves are working. This procedure takes approximately one hour. There are no restrictions for this procedure.  2) Your provider has recommended that you have a Myocardial PET CT at [facility].  You will be contacted regarding scheduling this appointment.    Follow-Up: At [facility], you and your health needs are our priority.  As part of our continuing mission to provide you with exceptional heart care, we have created designated Provider Care Teams.  These Care Teams include your primary Cardiologist (physician) and Advanced Practice Providers (APPs -  Physician Assistants and Nurse Practitioners) who all work together to provide you with the care you need, when you need it.  We recommend signing up for the patient portal called "MyChart".  Sign up information is provided on this After Visit Summary.  MyChart is used to connect with patients for Virtual Visits (Telemedicine).  Patients are able to view lab/test results, encounter notes, upcoming appointments, etc.  Non-urgent messages can be sent to your provider as well.   To learn more about what you can do with MyChart, go to [email or web address].    Your next appointment:   6 week(s)  The format for your next appointment:   In Person  Provider:   You may see Nelva Bush, MD or one of the following Advanced Practice Providers on  your designated Care Team:   Murray Hodgkins, NP Christell Faith, PA-C Marrianne Mood, PA-C Cadence Kathlen Mody, [place]   Other Instructions  You have been referred to [facility]rology. You will be contacted to schedule this appointment.

## 2021-02-10 NOTE — Progress Notes (Signed)
New Outpatient Visit Date: 02/10/2021  Referring Provider: Birdie Sons, [address] [place],  [place] [address]  Chief Complaint: Chest pain and left arm weakness  HPI:  Erin Sherman is a 55 y.o. female who is being seen today for the evaluation of chest pain at the request of Dr. Caryn Section. She has a history of peptic ulcer disease, cervical cancer, bronchitis, arthritis, migraines, and recent TIA.  Last month, she suddenly noticed weakness in the left arm manifesting as difficulty lifting up a 20 pound weight on her bed.  The next day, she began to experience a sharp left-sided chest pain radiating to the arm associated with lightheadedness and nausea.  She continued to have weakness of the left arm that ultimately prompted her to come to the [facility] emergency department on 01/17/2021).  Work-up was unrevealing other than a hemoglobin of 9.3.  She was given a diagnosis of TIA after brain MRI did not show any acute abnormality.  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  Today, Erin Sherman reports that her left arm is still weak though it has improved since her ED visit.  She recently noticed a small "bump" in the palm of her left hand and believes this is related to her left arm weakness.  She reports 1 other episode of transient chest pain, less severe than what brought her to the ED.  She notes occasional leg edema.  She has not had any shortness of breath.  She endorses transient lightheadedness when she moves quickly.  She reports undergoing cardiac testing 25 years ago at [facility] for chest pain.  She believes the tests were normal.  --------------------------------------------------------------------------------------------------  Cardiovascular History & Procedures: Cardiovascular Problems: Chest pain Question TIA  Risk Factors: Prior tobacco use, morbid obesity, and family history  Cath/PCI: None  CV Surgery: None  EP Procedures and  Devices: None  Non-Invasive Evaluation(s): None  Recent CV Pertinent Labs: Lab Results  Component Value Date   CHOL 292 (A) 12/17/2009   HDL 49 12/17/2009   LDLCALC 207 12/17/2009   TRIG 179 (A) 12/17/2009   BNP 27 09/30/2014   K 3.4 (L) 01/17/2021   K 4.5 09/30/2014   MG 2.1 12/22/2013   BUN 7 01/17/2021   BUN 9 02/28/2020   BUN 16 09/30/2014   CREATININE 0.67 01/17/2021   CREATININE 0.70 09/30/2014    --------------------------------------------------------------------------------------------------  Past Medical History:  Diagnosis Date   Arthritis    Cervical cancer ([place])    Hx of bronchitis    Migraines    Stomach ulcer    TIA (transient ischemic attack)     Past Surgical History:  Procedure Laterality Date   ABDOMINAL HYSTERECTOMY     with unilateral oophorectomy and part of cervix removed due to cervical cancer   CARPAL TUNNEL RELEASE Right 2013   CARPAL TUNNEL RELEASE  03/02/2010   Endoscopic carpal tunnel release. Rica Mast, MD (Orthopedic, [facility])   CESAREAN SECTION     COLONOSCOPY WITH PROPOFOL N/A 11/29/2016   Procedure: COLONOSCOPY WITH PROPOFOL;  Surgeon: [name], MD;  Location: [facility] ENDOSCOPY;  Service: Endoscopy;  Laterality: N/A;   exercise treadmill Test  05/31/2006   Normal   TUBAL LIGATION      Current Meds  Medication Sig   albuterol (VENTOLIN HFA) 108 (90 Base) MCG/ACT inhaler TAKE 2 PUFFS BY MOUTH EVERY 6 HOURS AS NEEDED   aspirin 81 MG EC tablet Take 1 tablet (81 mg total) by mouth daily.  Swallow whole.   calcium carbonate (TUMS - DOSED IN MG ELEMENTAL CALCIUM) 500 MG chewable tablet Chew 1 tablet by mouth daily as needed for indigestion.   cholecalciferol (VITAMIN D) 1000 UNITS tablet Take 1,000 Units by mouth daily.   EPINEPHrine (EPIPEN 2-PAK) 0.3 mg/0.3 mL IJ SOAJ injection Inject 0.3 mLs (0.3 mg total) into the muscle as needed. With allergic reaction   ipratropium-albuterol (DUONEB) 0.5-2.5 (3) MG/3ML SOLN Inhale 3 mLs into  the lungs every 6 (six) hours as needed. Reported on 12/28/2015   levalbuterol (XOPENEX HFA) 45 MCG/ACT inhaler Inhale 2 puffs into the lungs every 6 (six) hours as needed for wheezing. May substitute generic   mometasone (NASONEX) 50 MCG/ACT nasal spray Place 2 sprays into the nose daily.   oxyCODONE-acetaminophen (PERCOCET) 7.5-325 MG tablet Take 1 tablet by mouth every 6 (six) hours as needed for severe pain.   promethazine (PHENERGAN) 25 MG tablet Take 1 tablet (25 mg total) by mouth every 8 (eight) hours as needed for nausea.   Spacer/Aero-Holding Chambers (OPTICHAMBER ADVANTAGE) MISC 1 each by Other route once. Always uses her when you're using a metered-dose inhaler. You've aromatase medicine as much, he won't have his much side effect, but you it twice as much medicine and your lungs.   ZYRTEC-D ALLERGY & CONGESTION 5-120 MG tablet TAKE 1 TABLET BY MOUTH DAILY. AS NEEDED FOR ALLERGIES CAN RAISE BLOOD PRESSURE    Allergies: Latex, Sulfa antibiotics, and Meperidine  Social History   Tobacco Use   Smoking status: Former    Packs/day: 0.30    Years: 25.00    Pack years: 7.50    Types: Cigarettes    Quit date: 07/16/2008    Years since quitting: 12.5   Smokeless tobacco: Never  Vaping Use   Vaping Use: Never used  Substance Use Topics   Alcohol use: Not Currently   Drug use: No    Family History  Problem Relation Age of Onset   Emphysema Mother    Asthma Mother    Diabetes Mother        type 2   COPD Mother    Hypertension Mother    Emphysema Father    Hypertension Father    COPD Father    Colon cancer Father        died of colon cancer 08-07-2017   Asthma Sister    Asthma Brother    Heart attack Brother    Heart attack Maternal Grandmother    Heart attack Paternal Grandmother    Anemia Sister    Asthma Son    Cancer Maternal Grandfather        prostate   Cancer Maternal Aunt        breast   Cancer Other     Review of Systems: A 12-system review of systems was  performed and was negative except as noted in the HPI.  --------------------------------------------------------------------------------------------------  Physical Exam: BP 120/68 (BP Location: Right Arm, Patient Position: Sitting, Cuff Size: Large)   Pulse (!) 103   Ht 5' (1.524 m)   Wt 272 lb (123.4 kg)   SpO2 98%   BMI 53.12 kg/m   General: NAD. HEENT: No conjunctival pallor or scleral icterus. Facemask in place. Neck: Supple without lymphadenopathy, thyromegaly, JVD, or HJR. No carotid bruit. Lungs: Normal work of breathing. Clear to auscultation bilaterally without wheezes or crackles. Heart: Distant heart sounds.  Regular rate and rhythm without murmurs, rubs, or gallops. Non-displaced PMI. Abd: Bowel sounds present. Soft, NT/ND without  hepatosplenomegaly Ext: No lower extremity edema. Radial, PT, and DP pulses are 2+ bilaterally Skin: Warm and dry without rash. Neuro: CNIII-XII intact.  Normal fine touch sensation throughout.  Left hand grip strength 4+/5.  Otherwise, 5/5 strength in upper and lower extremities. Psych: Normal mood and affect.  EKG: Normal sinus rhythm with nonspecific T wave changes.  No significant change from prior tracing on 01/17/2021.  Lab Results  Component Value Date   WBC 8.6 01/17/2021   HGB 9.3 (L) 01/17/2021   HCT 30.3 (L) 01/17/2021   MCV 71.3 (L) 01/17/2021   PLT 333 01/17/2021    Lab Results  Component Value Date   NA 136 01/17/2021   K 3.4 (L) 01/17/2021   CL 106 01/17/2021   CO2 23 01/17/2021   BUN 7 01/17/2021   CREATININE 0.67 01/17/2021   GLUCOSE 99 01/17/2021   ALT 28 02/28/2020    Lab Results  Component Value Date   CHOL 292 (A) 12/17/2009   HDL 49 12/17/2009   LDLCALC 207 12/17/2009   TRIG 179 (A) 12/17/2009     --------------------------------------------------------------------------------------------------  ASSESSMENT AND PLAN: Chest pain: Erin Sherman reports at least 2 episodes of chest pain, one of which  was severe and ultimately led to ED visit last month.  Symptoms have occurred primarily at rest without a clear exertional component.  ED work-up did not show evidence of acute ischemia.  EKG today shows nonspecific T wave changes but overall is quite similar to the prior tracings in the ED.  Physical exam is challenging due to body habitus.  Cardiac risk factors include morbid obesity, prior tobacco use, and family history of premature ASCVD.  We have discussed further evaluation options and have agreed to obtain an echocardiogram as well as a pharmacologic myocardial PET/CT at [facility], given her BMI greater than 50.  Left arm weakness: Etiology of weakness is uncertain.  1 would expect symptoms of TIA to have resolved.  Brain MRI did not show any evidence of stroke.  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  Carotid Dopplers are currently pending (ordered by Dr. Caryn Section).  We will plan to continue aspirin 81 mg daily.  We will refer Erin Sherman to neurology [facility] per her request).  I advised her to seek immediate medical attention should she develop new focal neurologic deficits.  Microcytic anemia: Hemoglobin noted to be 9.3 during ED visit last month with MCV of 71 (both normal on last check in 02/2020).  These findings are concerning for iron deficiency from occult blood loss.  I have encouraged Erin Sherman to speak with Dr. Caryn Section soon as possible for further work-up.  Morbid obesity: BMI greater than 50.  Erin Sherman is working on weight loss through diet, which I encouraged her to continue with.  Follow-up: Return to clinic in 6 weeks  Nelva Bush, MD 02/10/2021 9:21 PM

## 2021-03-02 ENCOUNTER — Telehealth: Payer: Self-pay

## 2021-03-02 NOTE — Telephone Encounter (Signed)
Copied from [place] [id number]. Topic: General - Other >> Mar 02, 2021 12:03 PM Celene Kras wrote: Reason for CRM: Pt called stating that [employer] will be faxing over FMLA paperwork to office Yesterday or Today. She states that she is needing to have this taken care of 03/19/21. Please advise.

## 2021-03-03 ENCOUNTER — Ambulatory Visit (INDEPENDENT_AMBULATORY_CARE_PROVIDER_SITE_OTHER): Payer: BC Managed Care – PPO

## 2021-03-03 ENCOUNTER — Other Ambulatory Visit: Payer: Self-pay

## 2021-03-03 DIAGNOSIS — R079 Chest pain, unspecified: Secondary | ICD-10-CM

## 2021-03-03 LAB — ECHOCARDIOGRAM COMPLETE
AR max vel: 1.27 cm2
AV Area VTI: 1.13 cm2
AV Area mean vel: 1.23 cm2
AV Mean grad: 10 mmHg
AV Peak grad: 18.8 mmHg
Ao pk vel: 2.17 m/s
Area-P 1/2: 4.99 cm2
MV VTI: 2.25 cm2
S' Lateral: 2.6 cm

## 2021-03-03 MED ORDER — PERFLUTREN LIPID MICROSPHERE
1.0000 mL | INTRAVENOUS | Status: AC | PRN
Start: 2021-03-03 — End: 2021-03-03
  Administered 2021-03-03: 2 mL via INTRAVENOUS

## 2021-03-04 ENCOUNTER — Telehealth: Payer: Self-pay | Admitting: *Deleted

## 2021-03-04 NOTE — Telephone Encounter (Signed)
Attempted to call pt. No answer. Lmtcb.  

## 2021-03-04 NOTE — Telephone Encounter (Signed)
-----   Message from Nelva Bush, MD sent at 03/04/2021  7:34 AM EDT ----- Please let Erin Sherman know that her echocardiogram shows that her heart is contracting well.  No significant valvular abnormality is seen.  I recommend that we proceed with myocardial PET/CT stress test at [facility], as discussed at our recent office visit.

## 2021-03-05 NOTE — Telephone Encounter (Signed)
Attempted to call pt. No answer. Unable to leave vm.  Will try to reach pt at a later time.

## 2021-03-05 NOTE — Telephone Encounter (Signed)
Erin Sherman, has FMLA paper work been faxed yet?

## 2021-03-05 NOTE — Telephone Encounter (Signed)
Forms completed

## 2021-03-11 ENCOUNTER — Other Ambulatory Visit: Payer: Self-pay | Admitting: Family Medicine

## 2021-03-11 ENCOUNTER — Telehealth: Payer: Self-pay

## 2021-03-11 DIAGNOSIS — J4540 Moderate persistent asthma, uncomplicated: Secondary | ICD-10-CM

## 2021-03-11 DIAGNOSIS — M7061 Trochanteric bursitis, right hip: Secondary | ICD-10-CM

## 2021-03-11 DIAGNOSIS — J301 Allergic rhinitis due to pollen: Secondary | ICD-10-CM

## 2021-03-11 NOTE — Telephone Encounter (Signed)
Copied from [place] [id number]. Topic: General - Other >> Mar 11, 2021 10:50 AM Holley Dexter N wrote: Reason for CRM: Pt called in stating she wanted to speak with PCP nurse letting her know that she did receive her FMLA forms, and wanted to let them know that she was out for two days in august and she works 8 hours a day. Please advise.

## 2021-03-11 NOTE — Telephone Encounter (Signed)
Tried calling to get more information.  Pt's voicemail is not set up.    I'm not sure what the pt is needing.  It looks like she has the FMLA forms.  Generally she would need to report her absence to her the company that is handling her FMLA.   Please advise if she has anymore questions.   Thanks,   -Mickel Baas

## 2021-03-11 NOTE — Telephone Encounter (Signed)
Patient states company sent FMLA paperwork back to patient stating it was incomplete, please have PCP fill out  Part 2 Part A medical facts and dates patient was out of work which was 8/15 , 8/29.  Part 3 part B  amount of leave needed. Patient faxed over form to [phone]

## 2021-03-11 NOTE — Telephone Encounter (Signed)
Medication Refill - Medication:  oxyCODONE-acetaminophen (PERCOCET) 7.5-325 MG tablet, albuterol (VENTOLIN HFA) 108 (90 Base) MCG/ACT inhaler and ZYRTEC-D ALLERGY & CONGESTION 5-120 MG tablet  Has the patient contacted their pharmacy? No.   Preferred Pharmacy (with phone number or street name):  [id number] [place] [address] Phone:  [phone] Fax:  [phone]     Agent: Please be advised that RX refills may take up to 3 business days. We ask that you follow-up with your pharmacy.

## 2021-03-12 NOTE — Telephone Encounter (Signed)
Requested medication (s) are due for refill today: yes, expired med zyrtec D.  Requested medication (s) are on the active medication list: yes  Last refill:  oxy-12/04/20 #30 0 refills, albuterol- 07/28/20 6.7 each 6 refills .  Future visit scheduled: no  Notes to clinic:  noted delegated per protocol. Expired medication  zyrtec D. Requesting to use [facility]. Do you want to renew Rx?     Requested Prescriptions  Pending Prescriptions Disp Refills   oxyCODONE-acetaminophen (PERCOCET) 7.5-325 MG tablet 30 tablet 0    Sig: Take 1 tablet by mouth every 6 (six) hours as needed for severe pain.     Not Delegated - Analgesics:  Opioid Agonist Combinations Failed - 03/12/2021  5:43 PM      Failed - This refill cannot be delegated      Failed - Urine Drug Screen completed in last 360 days      Passed - Valid encounter within last 6 months    Recent Outpatient Visits           1 month ago Chest pain, unspecified type   [facility] Birdie Sons, MD   3 months ago Lumbar radiculitis   [facility] Birdie Sons, MD   7 months ago Primary insomnia   [facility] Birdie Sons, MD   1 year ago [place], Donald E, MD   1 year ago Acute non-recurrent pansinusitis   [place], FNP       Future Appointments             In 2 weeks Sharolyn Douglas, Clance Boll, NP [place], [facility]             ZYRTEC-D ALLERGY & CONGESTION 5-120 MG tablet 24 tablet 5     Not Delegated - Ear, Nose, and Throat:  Decongestants - Pseudoephedrine Failed - 03/12/2021  5:43 PM      Failed - This refill cannot be delegated      Passed - Last BP in normal range    BP Readings from Last 1 Encounters:  02/10/21 120/68          Passed - Valid encounter within last 12 months    Recent Outpatient Visits           1 month ago Chest pain, unspecified type   [facility] Birdie Sons, MD   3 months ago Lumbar radiculitis   [facility] Birdie Sons, MD   7 months ago Primary insomnia   [facility] Birdie Sons, MD   1 year ago [place], Donald E, MD   1 year ago Acute non-recurrent pansinusitis   [place], FNP       Future Appointments             In 2 weeks Theora Gianotti, NP [place], [facility]             albuterol (VENTOLIN HFA) 108 (90 Base) MCG/ACT inhaler 6.7 each 6    Sig: TAKE 2 PUFFS BY MOUTH EVERY 6 HOURS AS NEEDED     Pulmonology:  Beta Agonists Failed - 03/12/2021  5:43 PM      Failed - One inhaler should last at least one month. If the patient is requesting refills earlier, contact the patient to check for uncontrolled symptoms.  Passed - Valid encounter within last 12 months    Recent Outpatient Visits           1 month ago Chest pain, unspecified type   [facility] Birdie Sons, MD   3 months ago Lumbar radiculitis   [facility] Birdie Sons, MD   7 months ago Primary insomnia   [facility] Birdie Sons, MD   1 year ago [place], Donald E, MD   1 year ago Acute non-recurrent pansinusitis   [facility] [place], Kelby Aline, FNP       Future Appointments             In 2 weeks Sharolyn Douglas, Clance Boll, NP [facility], [facility]

## 2021-03-14 MED ORDER — ZYRTEC-D ALLERGY & CONGESTION 5-120 MG PO TB12: 1.0000 | ORAL_TABLET | Freq: Two times a day (BID) | ORAL | 5 refills | Status: DC | PRN

## 2021-03-14 MED ORDER — OXYCODONE-ACETAMINOPHEN 7.5-325 MG PO TABS: 1.0000 | ORAL_TABLET | Freq: Four times a day (QID) | ORAL | 0 refills | Status: DC | PRN

## 2021-03-14 MED ORDER — ALBUTEROL SULFATE HFA 108 (90 BASE) MCG/ACT IN AERS: INHALATION_SPRAY | RESPIRATORY_TRACT | 5 refills | Status: DC

## 2021-03-15 NOTE — Telephone Encounter (Signed)
There is nothing in the medical record about her being out on those two days. We need some type of documentation about why she was out on those days.

## 2021-03-17 NOTE — Telephone Encounter (Signed)
-----   Message from Nelva Bush, MD sent at 03/04/2021  7:34 AM EDT ----- Please let Erin Sherman know that her echocardiogram shows that her heart is contracting well.  No significant valvular abnormality is seen.  I recommend that we proceed with myocardial PET/CT stress test at [facility], as discussed at our recent office visit.

## 2021-03-17 NOTE — Telephone Encounter (Signed)
Spoke with pt on third attempt to call.  Pt made aware of echo results and Dr. Darnelle Bos recc.  Pt states that her myocardial PET/CT stress test was scheduled at [facility], however, pt decided to cancel.  Pt refuses to proceed with further testing at this time.  Pt states "After I saw that my heart was ok, I decided to cancel PET/CT. I am stressed with work, starting nursing school. I am working on losing weight and will follow through with my referral to neurology at [facility]." Pt asks if she still needs to follow up with our office.  Pt is scheduled for follow up 03/26/21.  Notified pt I will make Dr. Saunders Revel aware of above information and ask if follow up is recommended at this time.  Pt voiced understanding.

## 2021-03-18 NOTE — Telephone Encounter (Signed)
As echocardiogram does not exclude coronary artery disease, I suggest that Ms. Golonka keep her f/u appointment later this month to reassess her symptoms and to determine if it would be helpful to proceed with myocardial PET/CT.  Nelva Bush, MD [facility]

## 2021-03-19 NOTE — Telephone Encounter (Signed)
Pt called back in to follow up on FMLA paperwork being faxed. Pt says that today is the deadline. Advised pt of providers note below. Pt says that she went to [place] (8/15) and due to the climate change it caused a asthmatic flare up. Pt says that she wasn't seen, she self treated at home. Pt says when returning from [place] (8/29) due to the climate change she had another flare up. Pt says that again she was able to treat at home and wasn't seen by a provider. Pt says that she would like to have forms completed to cover her for those.   Please assist further.

## 2021-03-19 NOTE — Telephone Encounter (Signed)
Attempted to call pt. No answer. No voicemail set up.  Will try to reach pt at a later time.

## 2021-03-22 ENCOUNTER — Ambulatory Visit: Payer: Self-pay

## 2021-03-22 NOTE — Telephone Encounter (Signed)
Pt c/o wheezing and SOB with activity. Pt has a dry frequent cough. Pt c/o both sides of chest sore. Pt has been using her Albuterol neb without results. Pt's husband tested positive. Pt was negative.  Sx began yesterday. Care advice given and pt verbalized understanding. Advised pt to go to UC within next 4 hours. Pt agreeable.

## 2021-03-22 NOTE — Telephone Encounter (Signed)
Reason for Disposition  [1] Longstanding difficulty breathing AND [2] not responding to usual therapy  Answer Assessment - Initial Assessment Questions 1. RESPIRATORY STATUS: "Describe your breathing?" (e.g., wheezing, shortness of breath, unable to speak, severe coughing)      SOB at rest,  2. ONSET: "When did this breathing problem begin?"      Sunday 3. PATTERN "Does the difficult breathing come and go, or has it been constant since it started?"      constant 4. SEVERITY: "How bad is your breathing?" (e.g., mild, moderate, severe)    - MILD: No SOB at rest, mild SOB with walking, speaks normally in sentences, can lie down, no retractions, pulse < 100.    - MODERATE: SOB at rest, SOB with minimal exertion and prefers to sit, cannot lie down flat, speaks in phrases, mild retractions, audible wheezing, pulse 100-120.    - SEVERE: Very SOB at rest, speaks in single words, struggling to breathe, sitting hunched forward, retractions, pulse > 120      Moderate 5. RECURRENT SYMPTOM: "Have you had difficulty breathing before?" If Yes, ask: "When was the last time?" and "What happened that time?"      Last month to [place]. - Nebulizer 6. CARDIAC HISTORY: "Do you have any history of heart disease?" (e.g., heart attack, angina, bypass surgery, angioplasty)      no 7. LUNG HISTORY: "Do you have any history of lung disease?"  (e.g., pulmonary embolus, asthma, emphysema)     asthma 8. CAUSE: "What do you think is causing the breathing problem?"      Asthma- covid  9. OTHER SYMPTOMS: "Do you have any other symptoms? (e.g., dizziness, runny nose, cough, chest pain, fever)     Cough, nasal congestion 10. O2 SATURATION MONITOR:  "Do you use an oxygen saturation monitor (pulse oximeter) at home?" If Yes, "What is your reading (oxygen level) today?" "What is your usual oxygen saturation reading?" (e.g., 95%)       no 11. PREGNANCY: "Is there any chance you are pregnant?" "When was your last menstrual  period?"       No-no 12. TRAVEL: "Have you traveled out of the country in the last month?" (e.g., travel history, exposures)       no  Protocols used: Breathing Difficulty-A-AH

## 2021-03-22 NOTE — Telephone Encounter (Signed)
Spoke with pt.  Notified of Dr. Darnelle Bos recc.  Pt voiced understanding, states she will follow up as scheduled 03/26/21.

## 2021-03-23 NOTE — Telephone Encounter (Signed)
Please review triage message below, patient began having covid  symptoms on 9/18 and tested positive yesterday 03/22/21, Dr. Caryn Section does have an available virtual appointment but that is not until Friday, please advise. KW

## 2021-03-23 NOTE — Telephone Encounter (Signed)
Patient is calling to report she got tested at [facility]- her results came back + COVID. Patient is requesting  treatment.Patient states her symptoms started 9/18- headache, body ache, SOB. Patient has not been seen and was advised UC yesterday. Patient is requesting prednisone and other medications- she wants PCP to see her. Advised no virtual appointment- call to office- they request note for review and possible treatment- patient has been advised.

## 2021-03-23 NOTE — Telephone Encounter (Signed)
Pt called stating that she has been denied due to [facility] paperwork not being sent in. She is requesting to have this paperwork filled out and sent into her job as she is out now due to covid. Please advise.

## 2021-03-23 NOTE — Telephone Encounter (Signed)
Pt advised.  She states she is going to go to an Urgent Care.   Thanks,   -Mickel Baas

## 2021-03-23 NOTE — Telephone Encounter (Signed)
Oral medications are not indicated for covid if having shortness of breath or hypoxia. She needs to go to ER or urgent care today.

## 2021-03-24 NOTE — Telephone Encounter (Signed)
Form from [place] were received on 03/02/21. Dr. Caryn Section completed them on 03/05/21. Arbie Cookey faxed forms to [place] on 03/09/21. I have faxed the forms again to [facility] of this morning 03/24/21.

## 2021-03-24 NOTE — Telephone Encounter (Signed)
The addendum to the  FMLA forms was completed and faxed to [facility] this afternoon.Ok for [facility] to advise pt. TNP

## 2021-03-24 NOTE — Telephone Encounter (Signed)
Tried calling patient to advise of information below. No answer. I was unable to leave a message due to patient's voicemailbox not set up yet. OK for [facility] triage to advise if she returns my call.

## 2021-03-26 ENCOUNTER — Ambulatory Visit: Payer: BC Managed Care – PPO | Admitting: Nurse Practitioner

## 2021-04-08 ENCOUNTER — Telehealth: Payer: Self-pay | Admitting: Family Medicine

## 2021-04-08 ENCOUNTER — Ambulatory Visit: Payer: Self-pay

## 2021-04-08 NOTE — Telephone Encounter (Signed)
Pt called requesting a new breathing machine and also nebulizer with the [employer]. She is requesting for this to be ordered and shipped to her address on file. Please advise   She says "make sure the nebulizer machine pours out a lot of oxygen"

## 2021-04-08 NOTE — Telephone Encounter (Signed)
Pt. Diagnosed with COVID 19 03/28/21. Continues with cough and shortness of breath, chest tightness. No availability in the practice. Pt. Instructed to go to UC. Verbalizes understanding.   Reason for Disposition  [1] MILD difficulty breathing (e.g., minimal/no SOB at rest, SOB with walking, pulse <100) AND [2] new-onset  Answer Assessment - Initial Assessment Questions 1. COVID-19 ONSET: "When did the symptoms of COVID-19 first start?"     03/28/21 2. DIAGNOSIS CONFIRMATION: "How were you diagnosed?" (e.g., COVID-19 oral or nasal viral test; COVID-19 antibody test; doctor visit)     Home test 3. MAIN SYMPTOM:  "What is your main concern or symptom right now?" (e.g., breathing difficulty, cough, fatigue. loss of smell)     Shortness of breath 4. SYMPTOM ONSET: "When did the  symptoms  start?"     03/28/21 5. BETTER-SAME-WORSE: "Are you getting better, staying the same, or getting worse over the last 1 to 2 weeks?"     Same 6. RECENT MEDICAL VISIT: "Have you been seen by a healthcare provider (doctor, NP, PA) for these persisting COVID-19 symptoms?" If Yes, ask: "When were you seen?" (e.g., date)     Had tele visit 03/28/21 7. COUGH: "Do you have a cough?" If Yes, ask: "How bad is the cough?"       Mild 8. FEVER: "Do you have a fever?" If Yes, ask: "What is your temperature, how was it measured, and when did it start?"     No 9. BREATHING DIFFICULTY: "Are you having any trouble breathing?" If Yes, ask: "How bad is your breathing?" (e.g., mild, moderate, severe)    - MILD: No SOB at rest, mild SOB with walking, speaks normally in sentences, can lie down, no retractions, pulse < 100.    - MODERATE: SOB at rest, SOB with minimal exertion and prefers to sit, cannot lie down flat, speaks in phrases, mild retractions, audible wheezing, pulse 100-120.    - SEVERE: Very SOB at rest, speaks in single words, struggling to breathe, sitting hunched forward, retractions, pulse > 120       Mild 10. HIGH  RISK DISEASE: "Do you have any chronic medical problems?" (e.g., asthma, heart or lung disease, weak immune system, obesity, etc.)       No 11. VACCINE: "Have you gotten the COVID-19 vaccine?" If Yes, ask: "Which one, how many shots, when did you get it?"        12. BOOSTER: "Have you received your COVID-19 booster?" If Yes, ask: "Which one and when did you get it?"        13. PREGNANCY: "Is there any chance you are pregnant?" "When was your last menstrual period?"       No 14. OTHER SYMPTOMS: "Do you have any other symptoms?"  (e.g., fatigue, headache, muscle pain, weakness)       Cough, chest tightness 15. O2 SATURATION MONITOR:  "Do you use an oxygen saturation monitor (pulse oximeter) at home?" If Yes, ask "What is your reading (oxygen level) today?" "What is your usual oxygen saturation reading?" (e.g., 95%)       No  Protocols used: Coronavirus (COVID-19) Persisting Symptoms Follow-up Call-[facility]

## 2021-04-14 ENCOUNTER — Telehealth: Payer: BC Managed Care – PPO | Admitting: Family Medicine

## 2021-04-14 ENCOUNTER — Encounter: Payer: Self-pay | Admitting: Family Medicine

## 2021-04-14 DIAGNOSIS — J45909 Unspecified asthma, uncomplicated: Secondary | ICD-10-CM | POA: Diagnosis not present

## 2021-04-14 DIAGNOSIS — R0609 Other forms of dyspnea: Secondary | ICD-10-CM

## 2021-04-14 DIAGNOSIS — E669 Obesity, unspecified: Secondary | ICD-10-CM

## 2021-04-14 DIAGNOSIS — U099 Post covid-19 condition, unspecified: Secondary | ICD-10-CM

## 2021-04-14 MED ORDER — IPRATROPIUM-ALBUTEROL 0.5-2.5 (3) MG/3ML IN SOLN: 3.0000 mL | Freq: Four times a day (QID) | RESPIRATORY_TRACT | 3 refills | Status: DC | PRN

## 2021-04-14 MED ORDER — PHENTERMINE HCL 37.5 MG PO CAPS: 37.5000 mg | ORAL_CAPSULE | ORAL | 3 refills | Status: DC

## 2021-04-14 NOTE — Progress Notes (Signed)
MyChart Video Visit    Virtual Visit via Video Note   This visit type was conducted due to national recommendations for restrictions regarding the COVID-19 Pandemic (e.g. social distancing) in an effort to limit this patient's exposure and mitigate transmission in our community. This patient is at least at moderate risk for complications without adequate follow up. This format is felt to be most appropriate for this patient at this time. Physical exam was limited by quality of the video and audio technology used for the visit.   Patient location: home Provider location: [facility]  I discussed the limitations of evaluation and management by telemedicine and the availability of in person appointments. The patient expressed understanding and agreed to proceed.  Patient: Erin Sherman   DOB: 09/03/1965   55 y.o. Female  MRN: [id number] Visit Date: 04/14/2021  Today's healthcare provider: Lelon Huh, MD   Chief Complaint  Patient presents with   Shortness of Breath    Subjective    Shortness of Breath This is a new problem. Associated symptoms include chest pain.   Pt. Diagnosed with COVID 19 03/28/21. She continues to have a dry cough, shortness of breath with exertion and chest tightness. She has been using her albuterol inhaler several times a day. She is requesting a new nebulizer machine since her current one is not working properly. She requests this be ordered through [facility] at [place] at [phone] She states that she does not have home oximeter but when last checked at work it was in the mid 70s.   Medications: Outpatient Medications Prior to Visit  Medication Sig   albuterol (VENTOLIN HFA) 108 (90 Base) MCG/ACT inhaler TAKE 2 PUFFS BY MOUTH EVERY 6 HOURS AS NEEDED   aspirin 81 MG EC tablet Take 1 tablet (81 mg total) by mouth daily. Swallow whole.   calcium carbonate (TUMS - DOSED IN MG ELEMENTAL CALCIUM) 500 MG chewable tablet Chew 1 tablet by  mouth daily as needed for indigestion.   cholecalciferol (VITAMIN D) 1000 UNITS tablet Take 1,000 Units by mouth daily.   EPINEPHrine (EPIPEN 2-PAK) 0.3 mg/0.3 mL IJ SOAJ injection Inject 0.3 mLs (0.3 mg total) into the muscle as needed. With allergic reaction   ipratropium-albuterol (DUONEB) 0.5-2.5 (3) MG/3ML SOLN Inhale 3 mLs into the lungs every 6 (six) hours as needed. Reported on 12/28/2015   mometasone (NASONEX) 50 MCG/ACT nasal spray Place 2 sprays into the nose daily.   oxyCODONE-acetaminophen (PERCOCET) 7.5-325 MG tablet Take 1 tablet by mouth every 6 (six) hours as needed for severe pain.   promethazine (PHENERGAN) 25 MG tablet Take 1 tablet (25 mg total) by mouth every 8 (eight) hours as needed for nausea.   Spacer/Aero-Holding Chambers (OPTICHAMBER ADVANTAGE) MISC 1 each by Other route once. Always uses her when you're using a metered-dose inhaler. You've aromatase medicine as much, he won't have his much side effect, but you it twice as much medicine and your lungs.   ZYRTEC-D ALLERGY & CONGESTION 5-120 MG tablet Take 1 tablet by mouth 2 (two) times daily as needed for allergies.   levalbuterol (XOPENEX HFA) 45 MCG/ACT inhaler Inhale 2 puffs into the lungs every 6 (six) hours as needed for wheezing. May substitute generic (Patient not taking: Reported on 04/14/2021)   No facility-administered medications prior to visit.    Review of Systems  Respiratory:  Positive for cough (dry cough) and shortness of breath.   Cardiovascular:  Positive for chest pain.  Objective    There were no vitals taken for this visit.   Physical Exam   Awake, alert, oriented x 3. In no apparent distress   Assessment & Plan     1. Post-COVID chronic dyspnea  - Ambulatory referral to Pulmonology  2. Uncomplicated asthma, unspecified asthma severity, unspecified whether persistent  - Ambulatory referral to Pulmonology Refill- ipratropium-albuterol (DUONEB) 0.5-2.5 (3) MG/3ML SOLN; Inhale 3  mLs into the lungs every 6 (six) hours as needed. Reported on 12/28/2015  Dispense: 360 mL; Refill: 3  Order for new nebulizer sent to [facility].  3. Obesity refill - phentermine 37.5 MG capsule; Take 1 capsule (37.5 mg total) by mouth every morning. DO NOT TAKE THIS MEDICATION WHEN TAKING ZYRTEC-D  Dispense: 30 capsule; Refill: 3        I discussed the assessment and treatment plan with the patient. The patient was provided an opportunity to ask questions and all were answered. The patient agreed with the plan and demonstrated an understanding of the instructions.   The patient was advised to call back or seek an in-person evaluation if the symptoms worsen or if the condition fails to improve as anticipated.  I provided 12 minutes of non-face-to-face time during this encounter.  Video connection was lost when more than 50% of the duration of the visit was complete, at which time the remainder of the visit was completed via audio only.   The entirety of the information documented in the History of Present Illness, Review of Systems and Physical Exam were personally obtained by me. Portions of this information were initially documented by the CMA and reviewed by me for thoroughness and accuracy.    Lelon Huh, MD [facility] [phone] (phone) [phone] (fax)  [place]

## 2021-04-15 ENCOUNTER — Encounter: Payer: Self-pay | Admitting: Family Medicine

## 2021-04-18 ENCOUNTER — Other Ambulatory Visit: Payer: Self-pay | Admitting: Family Medicine

## 2021-04-18 DIAGNOSIS — J4540 Moderate persistent asthma, uncomplicated: Secondary | ICD-10-CM

## 2021-04-18 DIAGNOSIS — J301 Allergic rhinitis due to pollen: Secondary | ICD-10-CM

## 2021-04-18 DIAGNOSIS — M5416 Radiculopathy, lumbar region: Secondary | ICD-10-CM

## 2021-04-18 NOTE — Telephone Encounter (Signed)
Requested medication (s) are due for refill today: yes  Requested medication (s) are on the active medication list: yes  Last refill:  03/14/21  Future visit scheduled: yes  Notes to clinic:  med not delegated to NT to RF   Requested Prescriptions  Pending Prescriptions Disp Refills   ZYRTEC-D ALLERGY & CONGESTION 5-120 MG tablet [Pharmacy Med Name: ZYRTEC-D TABLET] 24 tablet 5    Sig: TAKE 1 TABLET BY MOUTH DAILY. AS NEEDED FOR ALLERGIES CAN RAISE BLOOD PRESSURE     Not Delegated - Ear, Nose, and Throat:  Decongestants - Pseudoephedrine Failed - 04/18/2021  4:39 PM      Failed - This refill cannot be delegated      Passed - Last BP in normal range    BP Readings from Last 1 Encounters:  02/10/21 120/68          Passed - Valid encounter within last 12 months    Recent Outpatient Visits           4 days ago Post-COVID chronic dyspnea   [facility] Birdie Sons, MD   2 months ago Chest pain, unspecified type   [facility] Birdie Sons, MD   4 months ago Lumbar radiculitis   [facility] Birdie Sons, MD   8 months ago Primary insomnia   [facility] Birdie Sons, MD   1 year ago [place], Donald E, MD       Future Appointments             In 1 month Fisher, Kirstie Peri, MD [facility], [place]

## 2021-04-18 NOTE — Telephone Encounter (Signed)
Requested Prescriptions  Pending Prescriptions Disp Refills  . predniSONE (DELTASONE) 10 MG tablet [Pharmacy Med Name: PREDNISONE 10 MG TABLET] 30 tablet     Sig: 6 TABLETS FOR 1 DAY, THEN 5 X 1 DAY, THEN 4 X 1 DAY, THEN 3 X 1 DAY, THEN 2 X 1 DAY THEN 1 DAILY     Not Delegated - Endocrinology:  Oral Corticosteroids Failed - 04/18/2021 11:58 AM      Failed - This refill cannot be delegated      Passed - Last BP in normal range    BP Readings from Last 1 Encounters:  02/10/21 120/68         Passed - Valid encounter within last 6 months    Recent Outpatient Visits          4 days ago Post-COVID chronic dyspnea   [facility] Birdie Sons, MD   2 months ago Chest pain, unspecified type   [facility] Birdie Sons, MD   4 months ago Lumbar radiculitis   [facility] Birdie Sons, MD   8 months ago Primary insomnia   [facility] Birdie Sons, MD   1 year ago [place], Kirstie Peri, MD      Future Appointments            In 1 month Fisher, Kirstie Peri, MD [place], [place] 160-4.5 MCG/ACT inhaler [Pharmacy Med Name: SYMBICORT 160-4.5 MCG INHALER] 10.2 each     Sig: TAKE 2 PUFFS BY MOUTH TWICE A DAY     Pulmonology:  Combination Products Passed - 04/18/2021 11:58 AM      Passed - Valid encounter within last 12 months    Recent Outpatient Visits          4 days ago Post-COVID chronic dyspnea   [facility] Birdie Sons, MD   2 months ago Chest pain, unspecified type   [facility] Birdie Sons, MD   4 months ago Lumbar radiculitis   [facility] Birdie Sons, MD   8 months ago Primary insomnia   [facility] Birdie Sons, MD   1 year ago [place], Donald E, MD      Future Appointments            In 1 month Fisher, Kirstie Peri, MD  [facility], [place]

## 2021-04-18 NOTE — Telephone Encounter (Signed)
Requested medication (s) are due for refill today: no  Requested medication (s) are on the active medication list: no  Last refill:  12/10/20  Future visit scheduled: yes  Notes to clinic:  med not delegated to NT to RF, or refuse   Requested Prescriptions  Pending Prescriptions Disp Refills   predniSONE (DELTASONE) 10 MG tablet [Pharmacy Med Name: PREDNISONE 10 MG TABLET] 30 tablet     Sig: 6 TABLETS FOR 1 DAY, THEN 5 X 1 DAY, THEN 4 X 1 DAY, THEN 3 X 1 DAY, THEN 2 X 1 DAY THEN 1 DAILY     Not Delegated - Endocrinology:  Oral Corticosteroids Failed - 04/18/2021 11:58 AM      Failed - This refill cannot be delegated      Passed - Last BP in normal range    BP Readings from Last 1 Encounters:  02/10/21 120/68          Passed - Valid encounter within last 6 months    Recent Outpatient Visits           4 days ago Post-COVID chronic dyspnea   [facility] Birdie Sons, MD   2 months ago Chest pain, unspecified type   [facility] Birdie Sons, MD   4 months ago Lumbar radiculitis   [facility] Birdie Sons, MD   8 months ago Primary insomnia   [facility] Birdie Sons, MD   1 year ago [place], Kirstie Peri, MD       Future Appointments             In 1 month Fisher, Kirstie Peri, MD [facility], [place] Prescriptions Disp Refills   SYMBICORT 160-4.5 MCG/ACT inhaler [Pharmacy Med Name: SYMBICORT 160-4.5 MCG INHALER] 10.2 each     Sig: TAKE 2 PUFFS BY MOUTH TWICE A DAY     Pulmonology:  Combination Products Passed - 04/18/2021 11:58 AM      Passed - Valid encounter within last 12 months    Recent Outpatient Visits           4 days ago Post-COVID chronic dyspnea   [facility] Birdie Sons, MD   2 months ago Chest pain, unspecified type   [facility] Birdie Sons, MD   4 months ago Lumbar  radiculitis   [facility] Birdie Sons, MD   8 months ago Primary insomnia   [facility] Birdie Sons, MD   1 year ago [place], Donald E, MD       Future Appointments             In 1 month Fisher, Kirstie Peri, MD [facility], [place]

## 2021-04-19 NOTE — Telephone Encounter (Signed)
LOV 04/14/2021 NOV: 06/08/2021  Last Refill: 03/14/2021 #24 5 Refills   Thanks,   -Mickel Baas

## 2021-04-20 MED ORDER — BUDESONIDE-FORMOTEROL FUMARATE 160-4.5 MCG/ACT IN AERO: 2.0000 | INHALATION_SPRAY | Freq: Two times a day (BID) | RESPIRATORY_TRACT | 12 refills | Status: DC

## 2021-05-04 NOTE — Telephone Encounter (Signed)
Erin Sherman, have you seen or do you know where the order is for patient's Nebulizer machine? It was supposed to have been faxed after her office visit on 04/14/2021. Patient has not reviewed the machine. I called [employer] at the numbers listed below, but the agent I spoke with states that they don't have the patient in their system, and an order was never received.

## 2021-05-19 ENCOUNTER — Other Ambulatory Visit: Payer: Self-pay

## 2021-05-19 ENCOUNTER — Encounter: Payer: Self-pay | Admitting: Internal Medicine

## 2021-05-19 ENCOUNTER — Ambulatory Visit: Payer: BC Managed Care – PPO | Admitting: Internal Medicine

## 2021-05-19 VITALS — BP 112/70 | HR 97 | Temp 98.0°F | Ht 60.0 in | Wt 283.2 lb

## 2021-05-19 DIAGNOSIS — G4719 Other hypersomnia: Secondary | ICD-10-CM | POA: Diagnosis not present

## 2021-05-19 DIAGNOSIS — J4540 Moderate persistent asthma, uncomplicated: Secondary | ICD-10-CM | POA: Diagnosis not present

## 2021-05-19 DIAGNOSIS — U071 COVID-19: Secondary | ICD-10-CM

## 2021-05-19 MED ORDER — SPIRIVA RESPIMAT 2.5 MCG/ACT IN AERS: 2.0000 | INHALATION_SPRAY | Freq: Every day | RESPIRATORY_TRACT | 5 refills | Status: DC

## 2021-05-19 NOTE — Addendum Note (Signed)
Addended by: Elby Beck R on: 05/19/2021 01:39 PM   Modules accepted: Orders

## 2021-05-19 NOTE — Progress Notes (Signed)
[facility] Consultation      Date: 05/19/2021,   MRN# [id number] TRAN RANDLE 1966/04/19   [name] is a 55 y.o. old female seen in consultation for ASTHMA      CHIEF COMPLAINT:   Cough and congestion and wheezing associated with    HISTORY OF PRESENT ILLNESS   55 year old pleasant African-American female morbidly obese seen today for ongoing symptoms of cough congestion and wheezing related to post COVID-19 infection several months ago  Patient has been on several rounds of antibiotics and prednisone She is currently on Symbicort 160 Also using albuterol MDIs as needed  She does have a nebulizer machine that she had but has broken and is waiting for DME company to renew her machine  At this time there is no active respiratory distress at this time Patient does have symptoms of severe persistent asthma    Patient is seen today for problems and issues with sleep related to excessive daytime sleepiness Patient  has been having sleep problems for many years Patient has been having excessive daytime sleepiness for a long time Patient has been having extreme fatigue and tiredness, lack of energy +  very Loud snoring every night + struggling breathe at night and gasps for air   Discussed sleep data and reviewed with patient.  Encouraged proper weight management.  Discussed driving precautions and its relationship with hypersomnolence.  Discussed operating dangerous equipment and its relationship with hypersomnolence.  Discussed sleep hygiene, and benefits of a fixed sleep waked time.  The importance of getting eight or more hours of sleep discussed with patient.  Discussed limiting the use of the computer and television before bedtime.  Decrease naps during the day, so night time sleep will become enhanced.  Limit caffeine, and sleep deprivation.  HTN, stroke, and heart failure are potential risk factors.   Discussed risk of  untreated sleep apnea including cardiac arrhthymias, stroke, DM, pulm HTN.    EPWORTH SLEEP SCORE 14    PAST MEDICAL HISTORY   Past Medical History:  Diagnosis Date   Arthritis    Cervical cancer ([place])    Hx of bronchitis    Migraines    Stomach ulcer    TIA (transient ischemic attack)      SURGICAL HISTORY   Past Surgical History:  Procedure Laterality Date   ABDOMINAL HYSTERECTOMY     with unilateral oophorectomy and part of cervix removed due to cervical cancer   CARPAL TUNNEL RELEASE Right 2013   CARPAL TUNNEL RELEASE  03/02/2010   Endoscopic carpal tunnel release. Rica Mast, MD (Orthopedic, [facility])   CESAREAN SECTION     COLONOSCOPY WITH PROPOFOL N/A 11/29/2016   Procedure: COLONOSCOPY WITH PROPOFOL;  Surgeon: [name], MD;  Location: [facility] ENDOSCOPY;  Service: Endoscopy;  Laterality: N/A;   exercise treadmill Test  05/31/2006   Normal   TUBAL LIGATION       FAMILY HISTORY   Family History  Problem Relation Age of Onset   Emphysema Mother    Asthma Mother    Diabetes Mother        type 2   COPD Mother    Hypertension Mother    Emphysema Father    Hypertension Father    COPD Father    Colon cancer Father        died of colon cancer Aug 06, 2017   Asthma Sister    Asthma Brother    Heart attack Brother    Heart attack Maternal Grandmother  Heart attack Paternal Grandmother    Anemia Sister    Asthma Son    Cancer Maternal Grandfather        prostate   Cancer Maternal Aunt        breast   Cancer Other      SOCIAL HISTORY   Social History   Tobacco Use   Smoking status: Former    Packs/day: 0.30    Years: 25.00    Pack years: 7.50    Types: Cigarettes    Quit date: 07/16/2008    Years since quitting: 12.8   Smokeless tobacco: Never  Vaping Use   Vaping Use: Never used  Substance Use Topics   Alcohol use: Not Currently   Drug use: No     MEDICATIONS    Home Medication:  Current Outpatient Rx   Order #: [id number] Class: Normal    Order #: [id number] Class: OTC   Order #: [id number] Class: Normal   Order #: [id number] Class: Historical Med   Order #: [id number] Class: Historical Med   Order #: [id number] Class: Normal   Order #: [id number] Class: Normal   Order #: [id number] Class: Normal   Order #: [id number] Class: Normal   Order #: [id number] Class: Normal   Order #: [id number] Class: Normal   Order #: [id number] Class: Normal   Order #: [id number] Class: Print   Order #: [id number] Class: Normal   Order #: [id number] Class: Normal    Current Medication:  Current Outpatient Medications:    albuterol (VENTOLIN HFA) 108 (90 Base) MCG/ACT inhaler, TAKE 2 PUFFS BY MOUTH EVERY 6 HOURS AS NEEDED, Disp: 6.7 g, Rfl: 5   aspirin 81 MG EC tablet, Take 1 tablet (81 mg total) by mouth daily. Swallow whole., Disp: , Rfl:    budesonide-formoterol (SYMBICORT) 160-4.5 MCG/ACT inhaler, Inhale 2 puffs into the lungs 2 (two) times daily., Disp: 1 each, Rfl: 12   calcium carbonate (TUMS - DOSED IN MG ELEMENTAL CALCIUM) 500 MG chewable tablet, Chew 1 tablet by mouth daily as needed for indigestion., Disp: , Rfl:    cholecalciferol (VITAMIN D) 1000 UNITS tablet, Take 1,000 Units by mouth daily., Disp: , Rfl:    EPINEPHrine (EPIPEN 2-PAK) 0.3 mg/0.3 mL IJ SOAJ injection, Inject 0.3 mLs (0.3 mg total) into the muscle as needed. With allergic reaction, Disp: 2 each, Rfl: 1   ipratropium-albuterol (DUONEB) 0.5-2.5 (3) MG/3ML SOLN, Inhale 3 mLs into the lungs every 6 (six) hours as needed. Reported on 12/28/2015, Disp: 360 mL, Rfl: 3   levalbuterol (XOPENEX HFA) 45 MCG/ACT inhaler, Inhale 2 puffs into the lungs every 6 (six) hours as needed for wheezing. May substitute generic, Disp: 1 Inhaler, Rfl: 12   mometasone (NASONEX) 50 MCG/ACT nasal spray, Place 2 sprays into the nose daily., Disp: 17 g, Rfl: 3   oxyCODONE-acetaminophen (PERCOCET) 7.5-325 MG tablet, Take 1 tablet by mouth every 6 (six) hours as needed for severe pain., Disp: 30 tablet, Rfl: 0    phentermine 37.5 MG capsule, Take 1 capsule (37.5 mg total) by mouth every morning. DO NOT TAKE THIS MEDICATION WHEN TAKING ZYRTEC-D, Disp: 30 capsule, Rfl: 3   promethazine (PHENERGAN) 25 MG tablet, Take 1 tablet (25 mg total) by mouth every 8 (eight) hours as needed for nausea., Disp: 20 tablet, Rfl: 0   Spacer/Aero-Holding Chambers (OPTICHAMBER ADVANTAGE) MISC, 1 each by Other route once. Always uses her when you're using a metered-dose inhaler. You've aromatase medicine as much, he won't have his much side effect, but you it twice as much medicine and your lungs., Disp: 1 each,  Rfl: 0   ZYRTEC-D ALLERGY & CONGESTION 5-120 MG tablet, TAKE 1 TABLET BY MOUTH DAILY. AS NEEDED FOR ALLERGIES CAN RAISE BLOOD PRESSURE, Disp: 30 tablet, Rfl: 5   predniSONE (DELTASONE) 10 MG tablet, 6 TABLETS FOR 1 DAY, THEN 5 X 1 DAY, THEN 4 X 1 DAY, THEN 3 X 1 DAY, THEN 2 X 1 DAY THEN 1 DAILY (Patient not taking: Reported on 05/19/2021), Disp: 21 tablet, Rfl: 0    ALLERGIES   Latex, Sulfa antibiotics, and Meperidine     REVIEW OF SYSTEMS    Review of Systems:  Gen:  Denies  fever, sweats, chills weigh loss  HEENT: Denies blurred vision, double vision, ear pain, eye pain, hearing loss, nose bleeds, sore throat Cardiac:  No dizziness, chest pain or heaviness, chest tightness,edema Resp:  + cough +sputum porduction, +shortness of breath,+wheezing, -hemoptysis,  Gi: Denies swallowing difficulty, stomach pain, nausea or vomiting, diarrhea, constipation, bowel incontinence Gu:  Denies bladder incontinence, burning urine Ext:   Denies Joint pain, stiffness or swelling Skin: Denies  skin rash, easy bruising or bleeding or hives Endoc:  Denies polyuria, polydipsia , polyphagia or weight change Psych:   Denies depression, insomnia or hallucinations   Other:  All other systems negative  BP 112/70 (BP Location: Right Arm, Patient Position: Sitting, Cuff Size: Large)   Pulse 97   Temp 98 F (36.7 C) (Oral)   Ht  5' (1.524 m)   Wt 283 lb 3.2 oz (128.5 kg)   SpO2 98%   BMI 55.31 kg/m    Physical Examination:   General Appearance: No distress  EYES PERRLA, EOM intact.   NECK Supple, No JVD Pulmonary: normal breath sounds, + wheezing.  CardiovascularNormal S1,S2.  No m/r/g.   Abdomen: Benign, Soft, non-tender. Skin:   warm, no rashes, no ecchymosis  Extremities: normal, no cyanosis, clubbing. Neuro:without focal findings,  speech normal  PSYCHIATRIC: Mood, affect within normal limits.   ALL OTHER ROS ARE NEGATIVE       ASSESSMENT/PLAN   55 year old pleasant African-American female seen today for moderate persistent asthma related to recent  COVID-19 infection with persistent symptoms of wheezing coughing and increased work of breathing and shortness of breath, associated with morbid obesity in the setting of deconditioned state with probable underlying obstructive sleep apnea with a history of excessive daytime sleepiness fatigue snoring and choking episodes  Severely persistent asthma secondary to COVID-19 infection At this time patient does not want any more steroids Will add Spiriva Respimat 2.5 to her inhaler regimen of Symbicort Patient needs new machine nebulizer from DME company  Excessive daytime sleepiness with fatigue and snoring and choking High probability of obstructive sleep apnea in the setting of morbid obesity Home sleep study ordered  Please avoid secondhand smoke exposure  Obesity -recommend significant weight loss -recommend changing diet  Deconditioned state -Recommend increased daily activity and exercise     MEDICATION ADJUSTMENTS/LABS AND TESTS ORDERED: Patient needs nebulizer machine DME company is [facility]   Recommend home sleep study to assess for sleep apnea  Start Spiriva Respimat 2.5  Continue Symbicort as prescribed  Please avoid secondhand smoke  Recommend weight loss  CURRENT MEDICATIONS REVIEWED AT LENGTH WITH PATIENT  TODAY   Patient  satisfied with Plan of action and management. All questions answered  Follow up   Total Time Spent  50 mins   Corrin Parker, M.D.  [facility]  Medical Director [place] Director [facility] Cardio-Pulmonary Department

## 2021-05-19 NOTE — Patient Instructions (Addendum)
Patient needs nebulizer machine DME company [facility]   Recommend home sleep study to assess for sleep apnea  Start Spiriva Respimat 2.5  Continue Symbicort as prescribed  Please avoid secondhand smoke  Recommend weight loss

## 2021-06-08 ENCOUNTER — Ambulatory Visit: Payer: BC Managed Care – PPO | Admitting: Family Medicine

## 2021-06-08 ENCOUNTER — Encounter: Payer: Self-pay | Admitting: Family Medicine

## 2021-06-08 ENCOUNTER — Other Ambulatory Visit: Payer: Self-pay

## 2021-06-08 VITALS — BP 144/74 | HR 86 | Temp 98.3°F | Resp 18 | Wt 284.0 lb

## 2021-06-08 DIAGNOSIS — M545 Low back pain, unspecified: Secondary | ICD-10-CM | POA: Diagnosis not present

## 2021-06-08 DIAGNOSIS — E669 Obesity, unspecified: Secondary | ICD-10-CM

## 2021-06-08 DIAGNOSIS — J309 Allergic rhinitis, unspecified: Secondary | ICD-10-CM | POA: Diagnosis not present

## 2021-06-08 DIAGNOSIS — L282 Other prurigo: Secondary | ICD-10-CM

## 2021-06-08 DIAGNOSIS — M5416 Radiculopathy, lumbar region: Secondary | ICD-10-CM

## 2021-06-08 DIAGNOSIS — J45909 Unspecified asthma, uncomplicated: Secondary | ICD-10-CM

## 2021-06-08 DIAGNOSIS — R1030 Lower abdominal pain, unspecified: Secondary | ICD-10-CM

## 2021-06-08 DIAGNOSIS — R21 Rash and other nonspecific skin eruption: Secondary | ICD-10-CM

## 2021-06-08 DIAGNOSIS — M51369 Other intervertebral disc degeneration, lumbar region without mention of lumbar back pain or lower extremity pain: Secondary | ICD-10-CM

## 2021-06-08 DIAGNOSIS — M5136 Other intervertebral disc degeneration, lumbar region: Secondary | ICD-10-CM

## 2021-06-08 DIAGNOSIS — G8929 Other chronic pain: Secondary | ICD-10-CM

## 2021-06-08 MED ORDER — PREDNISONE 10 MG PO TABS: ORAL_TABLET | ORAL | 0 refills | Status: DC

## 2021-06-08 MED ORDER — MOMETASONE FUROATE 50 MCG/ACT NA SUSP: 2.0000 | Freq: Every day | NASAL | 3 refills | Status: DC

## 2021-06-08 MED ORDER — PHENTERMINE HCL 37.5 MG PO CAPS: 37.5000 mg | ORAL_CAPSULE | ORAL | 3 refills | Status: DC

## 2021-06-08 MED ORDER — AZELASTINE HCL 0.1 % NA SOLN: 2.0000 | Freq: Two times a day (BID) | NASAL | 12 refills | Status: AC

## 2021-06-08 MED ORDER — TRIAMCINOLONE ACETONIDE 0.5 % EX CREA: 1.0000 "application " | TOPICAL_CREAM | Freq: Three times a day (TID) | CUTANEOUS | 2 refills | Status: DC

## 2021-06-08 MED ORDER — OXYCODONE-ACETAMINOPHEN 7.5-325 MG PO TABS: 1.0000 | ORAL_TABLET | Freq: Four times a day (QID) | ORAL | 0 refills | Status: DC | PRN

## 2021-06-08 NOTE — Patient Instructions (Addendum)
Please review the attached list of medications and notify my office if there are any errors.   The best weight loss medications are [place] and Saxenda. Check with your insurance to see if either of these are covered thisy year or in 2023.   Try taking OTC Align probiotic to help with upset stomach

## 2021-06-08 NOTE — Progress Notes (Signed)
Established patient visit   Patient: Erin Sherman   DOB: [birth year]   55 y.o. Female  MRN: [id number] Visit Date: 06/08/2021  Today's healthcare provider: Lelon Huh, MD   Chief Complaint  Patient presents with   Asthma   Subjective    HPI  Follow up for Asthma:  She reports good compliance with treatment. She feels that condition is Unchanged. Patient states that she has had frequent intermittent flare ups and  exacerbations of Asthma since having COVID this past summer. Symptoms include dry cough, head/ sinus congestion shortness of breath and wheezing. She has been using Spiriva daily and Alka Seltzer cold plus for the past 2-3 days. Patient is currently on a prednisone taper. She is not having side effects.   Was seen by Dr. Mortimer Sherman 05/19/2021 and prescribed tiotropium to take in addition to [facility] and ordered home sleep test, she has follow up in February. She is also on last 2 days of steroid taper -----------------------------------------------------------------------------------------   [name] She has not been able to lose weight, but has also been on several rounds of steroid contributing to weight. She is tolerating phentermine well with no adverse effects.   She also reports her sinus stay congested all the time. Was recently prescribed fluticasone nasal spray, but previously prescribed Nasonex which she states worked better. Has constant sinus pressure and clear/yellow drainage.   She also has persistent arthritis pains in back and legs getting worse over time. She attributes this to obesity.   She also complains of pain and cramping across her lower stomach every time she eats certain foods, especially fruits and grains. State she has had this every since having abdominal surgery for what sounds like some type of perforation year ago.      Medications: Outpatient Medications Prior to Visit   Medication Sig   albuterol (VENTOLIN HFA) 108 (90 Base) MCG/ACT inhaler TAKE 2 PUFFS BY MOUTH EVERY 6 HOURS AS NEEDED   aspirin 81 MG EC tablet Take 1 tablet (81 mg total) by mouth daily. Swallow whole.   budesonide-formoterol (SYMBICORT) 160-4.5 MCG/ACT inhaler Inhale 2 puffs into the lungs 2 (two) times daily.   calcium carbonate (TUMS - DOSED IN MG ELEMENTAL CALCIUM) 500 MG chewable tablet Chew 1 tablet by mouth daily as needed for indigestion.   cholecalciferol (VITAMIN D) 1000 UNITS tablet Take 1,000 Units by mouth daily.   EPINEPHrine (EPIPEN 2-PAK) 0.3 mg/0.3 mL IJ SOAJ injection Inject 0.3 mLs (0.3 mg total) into the muscle as needed. With allergic reaction   ipratropium-albuterol (DUONEB) 0.5-2.5 (3) MG/3ML SOLN Inhale 3 mLs into the lungs every 6 (six) hours as needed. Reported on 12/28/2015   levalbuterol (XOPENEX HFA) 45 MCG/ACT inhaler Inhale 2 puffs into the lungs every 6 (six) hours as needed for wheezing. May substitute generic   mometasone (NASONEX) 50 MCG/ACT nasal spray Place 2 sprays into the nose daily.   oxyCODONE-acetaminophen (PERCOCET) 7.5-325 MG tablet Take 1 tablet by mouth every 6 (six) hours as needed for severe pain.   phentermine 37.5 MG capsule Take 1 capsule (37.5 mg total) by mouth every morning. DO NOT TAKE THIS MEDICATION WHEN TAKING ZYRTEC-D   predniSONE (DELTASONE) 10 MG tablet 6 TABLETS FOR 1 DAY, THEN 5 X 1 DAY, THEN 4 X 1 DAY, THEN 3 X 1 DAY, THEN 2 X 1 DAY THEN 1 DAILY   promethazine (PHENERGAN) 25 MG tablet Take  1 tablet (25 mg total) by mouth every 8 (eight) hours as needed for nausea.   Spacer/Aero-Holding Chambers (OPTICHAMBER ADVANTAGE) MISC 1 each by Other route once. Always uses her when you're using a metered-dose inhaler. You've aromatase medicine as much, he won't have his much side effect, but you it twice as much medicine and your lungs.   Tiotropium Bromide Monohydrate (SPIRIVA RESPIMAT) 2.5 MCG/ACT AERS Inhale 2 puffs into the lungs daily.    ZYRTEC-D ALLERGY & CONGESTION 5-120 MG tablet TAKE 1 TABLET BY MOUTH DAILY. AS NEEDED FOR ALLERGIES CAN RAISE BLOOD PRESSURE   No facility-administered medications prior to visit.    Review of Systems  Constitutional:  Positive for fatigue. Negative for appetite change, chills and fever.  Respiratory:  Positive for cough, shortness of breath and wheezing. Negative for chest tightness.   Cardiovascular:  Negative for chest pain and palpitations.  Gastrointestinal:  Negative for abdominal pain, nausea and vomiting.  Neurological:  Negative for dizziness and weakness.      Objective    BP (!) 144/74 (BP Location: Left Arm, Patient Position: Sitting, Cuff Size: Large)   Pulse 86   Temp 98.3 F (36.8 C) (Oral)   Resp 18   Wt 284 lb (128.8 kg)   SpO2 99% Comment: room air  BMI 55.46 kg/m  {Show previous vital signs (optional):23777}  Physical Exam   General: Appearance:    Severely obese female in no acute distress  Eyes:    PERRL, conjunctiva/corneas clear, EOM's intact       HEENT:   Nasal mucous pale and congested with clear discharge.   Lungs:     Occasional expiratory wheezes, no rales or rhonchi, respirations unlabored  Heart:    Normal heart rate. Normal rhythm. No murmurs, rubs, or gallops.    MS:   All extremities are intact.    Neurologic:   Awake, alert, oriented x 3. No apparent focal neurological defect.          Assessment & Plan     1. Allergic rhinitis Fluticasone nasal spray not effective, start back on  mometasone (NASONEX) 50 MCG/ACT nasal spray; Place 2 sprays into the nose daily.  Dispense: 17 g; Refill: 3  add azelastine (ASTELIN) 0.1 % nasal spray; Place 2 sprays into both nostrils 2 (two) times daily. Use in each nostril as directed  Dispense: 30 mL; Refill: 12. Take on schedule until sx controlled   2. Morbid obesity ([place]) Tolerating phentermine 37.5 MG capsule; Take 1 capsule (37.5 mg total) by mouth every morning. DO NOT TAKE THIS MEDICATION WHEN  TAKING ZYRTEC-D  Dispense: 30 capsule; Refill: 3. Weight loss effect hampered by frequent rounds of prednisone which she hopes to be able to avoid with other medication changes.    3. Lumbar radiculitis refill- oxyCODONE-acetaminophen (PERCOCET) 7.5-325 MG tablet; Take 1 tablet by mouth every 6 (six) hours as needed for severe pain.  Dispense: 30 tablet; Refill: 0  She is to keep prednisone on hand in case of frequent flares - predniSONE (DELTASONE) 10 MG tablet; 6 TABLETS FOR 1 DAY, THEN 5 X 1 DAY, THEN 4 X 1 DAY, THEN 3 X 1 DAY, THEN 2 X 1 DAY THEN 1 DAILY  Dispense: 21 tablet; Refill: 0  4. Chronic midline low back pain without sciatica refill - oxyCODONE-acetaminophen (PERCOCET) 7.5-325 MG tablet; Take 1 tablet by mouth every 6 (six) hours as needed for severe pain.  Dispense: 30 tablet; Refill: 0  5. DDD (degenerative disc disease),  lumbar refill - oxyCODONE-acetaminophen (PERCOCET) 7.5-325 MG tablet; Take 1 tablet by mouth every 6 (six) hours as needed for severe pain.  Dispense: 30 tablet; Refill: 0  6. Chronic asthma without complication, unspecified asthma severity, unspecified whether persistent No being followed by Dr. Mortimer Sherman with recent addition of tiotropium. Follow up pulmonology as scheduled.    Sent in 6 day prednisone taper to keep on hind for future exacerbations.   7.  Lower abdominal pain This seems to be post prandial following ingesting of certain foods. Recommend she try OTC pro biotic such as Align. Consider GI referral.   8. Papular urticaria Re currant. refill triamcinolone cream (KENALOG) 0.5 %; Apply 1 application topically 3 (three) times daily.  Dispense: 45 g; Refill: 2   Addressed extensive list of chronic and acute medical problems today requiring 48 minutes reviewing her medical record, counseling patient regarding her conditions and coordination of care.        The entirety of the information documented in the History of Present Illness, Review of  Systems and Physical Exam were personally obtained by me. Portions of this information were initially documented by the CMA and reviewed by me for thoroughness and accuracy.     Erin Huh, MD  [facility] [phone] (phone) [phone] (fax)  [place]

## 2021-06-21 ENCOUNTER — Telehealth: Payer: Self-pay | Admitting: Family Medicine

## 2021-06-21 ENCOUNTER — Ambulatory Visit: Payer: Self-pay | Admitting: *Deleted

## 2021-06-21 ENCOUNTER — Ambulatory Visit: Payer: Self-pay

## 2021-06-21 MED ORDER — AZITHROMYCIN 250 MG PO TABS: ORAL_TABLET | ORAL | 0 refills | Status: AC

## 2021-06-21 NOTE — Telephone Encounter (Signed)
Pt called saying she would like Dr. Caryn Section to send her tessalon pearls and a antibiotic.  She said she is coughing.  I ask her to make an appt but she said she couldn't come in and does not want to see anyone except him.   CB#  [phone]   Attempted to call patient- no answer on call back number given

## 2021-06-21 NOTE — Telephone Encounter (Signed)
Pt called in returning the call.   The line either disconnected or she hung up prior to being connected from the agent.   I attempted to call her back but did not get an answer or a voicemail machine.

## 2021-06-21 NOTE — Telephone Encounter (Signed)
3rd attempt, pt called, unable to leave VM d/t mailbox not being set up. Unable to reach patient after 3 attempts by [facility] NT, routing to the provider for resolution per protocol.

## 2021-06-21 NOTE — Addendum Note (Signed)
Addended by: Birdie Sons on: 06/21/2021 05:21 PM   Modules accepted: Orders

## 2021-06-21 NOTE — Telephone Encounter (Signed)
°  Chief Complaint: meds request-  Symptoms: thick  yellow phlegm from chest Frequency: very frequent harsh cough and congested cough Pertinent Negatives: Patient denies sore thraot vomiting breathing is at baseline, fatigue, SOB with exertion Disposition: [] ED /[] Urgent Care (no appt availability in office) / [] Appointment(In office/virtual)/ []  [facility]/ [] Home Care/ [x] Refused Recommended Disposition  Additional Notes:  Requesting abx and tessalon perels  Send to pharmacy on file.     Reason for Disposition  [1] Caller has NON-URGENT medicine question about med that PCP prescribed AND [2] triager unable to answer question  Answer Assessment - Initial Assessment Questions 1. DRUG NAME: "What medicine do you need to have refilled?"     Tessalon perels, abx 2. REFILLS REMAINING: "How many refills are remaining?" (Note: The label on the medicine or pill bottle will show how many refills are remaining. If there are no refills remaining, then a renewal may be needed.)     none 3. EXPIRATION DATE: "What is the expiration date?" (Note: The label states when the prescription will expire, and thus can no longer be refilled.)     N/a 4. PRESCRIBING HCP: "Who prescribed it?" Reason: If prescribed by specialist, call should be referred to that group.     N/a 5. SYMPTOMS: "Do you have any symptoms?"    Thick yellow phlegm, freq. cough 6. PREGNANCY: "Is there any chance that you are pregnant?" "When was your last menstrual period?"     *No Answer*  Protocols used: Medication Refill and Renewal Call-A-AH

## 2021-06-22 ENCOUNTER — Telehealth: Payer: Self-pay | Admitting: Family Medicine

## 2021-06-22 MED ORDER — BENZONATATE 200 MG PO CAPS: 200.0000 mg | ORAL_CAPSULE | Freq: Two times a day (BID) | ORAL | 0 refills | Status: DC | PRN

## 2021-06-22 NOTE — Telephone Encounter (Signed)
Patient following up on tessalon perles rx and would like script sent to   [id number] - [place], [place] - [address] Phone:  [phone]  Fax:  [phone]      Patient wanted PCP to know she has humidifier as per his recommendation.

## 2021-07-13 ENCOUNTER — Other Ambulatory Visit: Payer: Self-pay

## 2021-07-13 ENCOUNTER — Ambulatory Visit (INDEPENDENT_AMBULATORY_CARE_PROVIDER_SITE_OTHER): Payer: BC Managed Care – PPO

## 2021-07-13 DIAGNOSIS — G4733 Obstructive sleep apnea (adult) (pediatric): Secondary | ICD-10-CM

## 2021-07-13 DIAGNOSIS — G4719 Other hypersomnia: Secondary | ICD-10-CM

## 2021-07-16 DIAGNOSIS — G4733 Obstructive sleep apnea (adult) (pediatric): Secondary | ICD-10-CM | POA: Diagnosis not present

## 2021-07-19 ENCOUNTER — Telehealth: Payer: Self-pay | Admitting: Internal Medicine

## 2021-07-19 DIAGNOSIS — G4733 Obstructive sleep apnea (adult) (pediatric): Secondary | ICD-10-CM

## 2021-07-19 NOTE — Telephone Encounter (Signed)
VS has read sleep study and it has been scanned to pt's chart.

## 2021-07-21 NOTE — Telephone Encounter (Signed)
Dr.Kasa will be in office 07/22/2021

## 2021-07-22 NOTE — Telephone Encounter (Signed)
Patient is aware of results and voiced her understanding.  Order placed for cpap.  Recall placed. Nothing further needed.

## 2021-08-11 ENCOUNTER — Encounter: Payer: Self-pay | Admitting: Internal Medicine

## 2021-08-11 ENCOUNTER — Other Ambulatory Visit: Payer: Self-pay

## 2021-08-11 ENCOUNTER — Ambulatory Visit (INDEPENDENT_AMBULATORY_CARE_PROVIDER_SITE_OTHER): Payer: BC Managed Care – PPO | Admitting: Internal Medicine

## 2021-08-11 VITALS — BP 130/80 | HR 96 | Temp 97.3°F | Ht 61.0 in | Wt 283.0 lb

## 2021-08-11 DIAGNOSIS — J4540 Moderate persistent asthma, uncomplicated: Secondary | ICD-10-CM | POA: Diagnosis not present

## 2021-08-11 DIAGNOSIS — G4733 Obstructive sleep apnea (adult) (pediatric): Secondary | ICD-10-CM

## 2021-08-11 DIAGNOSIS — U071 COVID-19: Secondary | ICD-10-CM | POA: Diagnosis not present

## 2021-08-11 MED ORDER — ALBUTEROL SULFATE HFA 108 (90 BASE) MCG/ACT IN AERS: INHALATION_SPRAY | RESPIRATORY_TRACT | 5 refills | Status: DC

## 2021-08-11 NOTE — Progress Notes (Signed)
[facility] Consultation      Date: 08/11/2021,   MRN# [id number] Erin Sherman [birth year]   Erin Sherman is a 56 y.o. old female seen in consultation for ASTHMA   SYNOPSIS 56 year old pleasant African-American female morbidly obese seen today for ongoing symptoms of cough congestion and wheezing related to post COVID-19 infection several months ago  Patient has been on several rounds of antibiotics and prednisone She is currently on Symbicort 160 Also using albuterol MDIs as needed  She does have a nebulizer machine that she had but has broken and is waiting for DME company to renew her machine A while CHIEF COMPLAINT:   Follow-up cough congestion and wheezing    HISTORY OF PRESENT ILLNESS   No exacerbation at this time No evidence of heart failure at this time No evidence or signs of infection at this time No respiratory distress No fevers, chills, nausea, vomiting, diarrhea No evidence of lower extremity edema No evidence hemoptysis  Patient states short of breath increased dyspnea on exertion for the past 8 months Patient refused to take any type of prednisone at this time Patient was diagnosed with COVID in July 2022 Since then patient has had increased work of breathing and shortness of breath and dyspnea on exertion  Patient currently on Symbicort twice a day Patient on Spiriva Respimat 2.5 Patient ran out of albuterol  Home sleep study shows mild sleep apnea AHI of 10 Results relayed to patient in detail CPAP titration study pending    PAST MEDICAL HISTORY   Past Medical History:  Diagnosis Date   Arthritis    Cervical cancer ([place])    Hx of bronchitis    Migraines    Stomach ulcer    TIA (transient ischemic attack)      SURGICAL HISTORY   Past Surgical History:  Procedure Laterality Date   ABDOMINAL HYSTERECTOMY     with unilateral oophorectomy and part of cervix removed due to cervical cancer   CARPAL TUNNEL  RELEASE Right 2013   CARPAL TUNNEL RELEASE  03/02/2010   Endoscopic carpal tunnel release. Rica Mast, MD (Orthopedic, [facility])   CESAREAN SECTION     COLONOSCOPY WITH PROPOFOL N/A 11/29/2016   Procedure: COLONOSCOPY WITH PROPOFOL;  Surgeon: [name], MD;  Location: [facility] ENDOSCOPY;  Service: Endoscopy;  Laterality: N/A;   exercise treadmill Test  05/31/2006   Normal   TUBAL LIGATION       FAMILY HISTORY   Family History  Problem Relation Age of Onset   Emphysema Mother    Asthma Mother    Diabetes Mother        type 2   COPD Mother    Hypertension Mother    Emphysema Father    Hypertension Father    COPD Father    Colon cancer Father        died of colon cancer July 28, 2017   Asthma Sister    Asthma Brother    Heart attack Brother    Heart attack Maternal Grandmother    Heart attack Paternal Grandmother    Anemia Sister    Asthma Son    Cancer Maternal Grandfather        prostate   Cancer Maternal Aunt        breast   Cancer Other      SOCIAL HISTORY   Social History   Tobacco Use   Smoking status: Former    Packs/day: 0.30    Years: 25.00  Pack years: 7.50    Types: Cigarettes    Quit date: 07/16/2008    Years since quitting: 13.0   Smokeless tobacco: Never  Vaping Use   Vaping Use: Never used  Substance Use Topics   Alcohol use: Not Currently   Drug use: No     MEDICATIONS    Home Medication:  Current Outpatient Rx   Order #: [id number] Class: Normal   Order #: [id number] Class: OTC   Order #: [id number] Class: Normal   Order #: [id number] Class: Normal   Order #: [id number] Class: Normal   Order #: [id number] Class: Historical Med   Order #: [id number] Class: Historical Med   Order #: [id number] Class: Normal   Order #: [id number] Class: Normal   Order #: [id number] Class: Normal   Order #: [id number] Class: Normal   Order #: [id number] Class: Normal   Order #: [id number] Class: Normal   Order #: [id number] Class: Normal   Order #: [id number] Class: Normal   Order  #: [id number] Class: Print   Order #: [id number] Class: Normal   Order #: [id number] Class: Normal   Order #: [id number] Class: Normal    Current Medication:  Current Outpatient Medications:    albuterol (VENTOLIN HFA) 108 (90 Base) MCG/ACT inhaler, TAKE 2 PUFFS BY MOUTH EVERY 6 HOURS AS NEEDED, Disp: 6.7 g, Rfl: 5   aspirin 81 MG EC tablet, Take 1 tablet (81 mg total) by mouth daily. Swallow whole., Disp: , Rfl:    azelastine (ASTELIN) 0.1 % nasal spray, Place 2 sprays into both nostrils 2 (two) times daily. Use in each nostril as directed, Disp: 30 mL, Rfl: 12   benzonatate (TESSALON) 200 MG capsule, Take 1 capsule (200 mg total) by mouth 2 (two) times daily as needed for cough., Disp: 20 capsule, Rfl: 0   budesonide-formoterol (SYMBICORT) 160-4.5 MCG/ACT inhaler, Inhale 2 puffs into the lungs 2 (two) times daily., Disp: 1 each, Rfl: 12   calcium carbonate (TUMS - DOSED IN MG ELEMENTAL CALCIUM) 500 MG chewable tablet, Chew 1 tablet by mouth daily as needed for indigestion., Disp: , Rfl:    cholecalciferol (VITAMIN D) 1000 UNITS tablet, Take 1,000 Units by mouth daily., Disp: , Rfl:    EPINEPHrine (EPIPEN 2-PAK) 0.3 mg/0.3 mL IJ SOAJ injection, Inject 0.3 mLs (0.3 mg total) into the muscle as needed. With allergic reaction, Disp: 2 each, Rfl: 1   ipratropium-albuterol (DUONEB) 0.5-2.5 (3) MG/3ML SOLN, Inhale 3 mLs into the lungs every 6 (six) hours as needed. Reported on 12/28/2015, Disp: 360 mL, Rfl: 3   levalbuterol (XOPENEX HFA) 45 MCG/ACT inhaler, Inhale 2 puffs into the lungs every 6 (six) hours as needed for wheezing. May substitute generic, Disp: 1 Inhaler, Rfl: 12   mometasone (NASONEX) 50 MCG/ACT nasal spray, Place 2 sprays into the nose daily., Disp: 17 g, Rfl: 3   oxyCODONE-acetaminophen (PERCOCET) 7.5-325 MG tablet, Take 1 tablet by mouth every 6 (six) hours as needed for severe pain., Disp: 30 tablet, Rfl: 0   phentermine 37.5 MG capsule, Take 1 capsule (37.5 mg total) by mouth every  morning. DO NOT TAKE THIS MEDICATION WHEN TAKING ZYRTEC-D, Disp: 30 capsule, Rfl: 3   predniSONE (DELTASONE) 10 MG tablet, 6 TABLETS FOR 1 DAY, THEN 5 X 1 DAY, THEN 4 X 1 DAY, THEN 3 X 1 DAY, THEN 2 X 1 DAY THEN 1 DAILY, Disp: 21 tablet, Rfl: 0   promethazine (PHENERGAN) 25 MG tablet, Take 1 tablet (25 mg total) by mouth every 8 (eight) hours as needed for nausea., Disp: 20 tablet, Rfl: 0   Spacer/Aero-Holding Chambers (  OPTICHAMBER ADVANTAGE) MISC, 1 each by Other route once. Always uses her when you're using a metered-dose inhaler. You've aromatase medicine as much, he won't have his much side effect, but you it twice as much medicine and your lungs., Disp: 1 each, Rfl: 0   Tiotropium Bromide Monohydrate (SPIRIVA RESPIMAT) 2.5 MCG/ACT AERS, Inhale 2 puffs into the lungs daily., Disp: 1 each, Rfl: 5   triamcinolone cream (KENALOG) 0.5 %, Apply 1 application topically 3 (three) times daily., Disp: 45 g, Rfl: 2   ZYRTEC-D ALLERGY & CONGESTION 5-120 MG tablet, TAKE 1 TABLET BY MOUTH DAILY. AS NEEDED FOR ALLERGIES CAN RAISE BLOOD PRESSURE, Disp: 30 tablet, Rfl: 5    ALLERGIES   Latex, Sulfa antibiotics, and Meperidine     REVIEW OF SYSTEMS    Review of Systems:  Gen:  Denies  fever, sweats, chills weigh loss  HEENT: Denies blurred vision, double vision, ear pain, eye pain, hearing loss, nose bleeds, sore throat Cardiac:  No dizziness, chest pain or heaviness, chest tightness,edema Resp:  - cough -sputum porduction, +shortness of breath,+wheezing, -hemoptysis,  Other:  All other systems negative  BP 130/80 (BP Location: Left Arm, Patient Position: Sitting, Cuff Size: Normal)    Pulse 96    Temp (!) 97.3 F (36.3 C) (Oral)    Ht 5\' 1"  (1.549 m)    Wt 283 lb (128.4 kg)    SpO2 100%    BMI 53.47 kg/m   Physical Examination:   General Appearance: No distress  EYES PERRLA, EOM intact.   NECK Supple, No JVD Pulmonary: normal breath sounds, + wheezing.  CardiovascularNormal S1,S2.  No  m/r/g.   ALL OTHER ROS ARE NEGATIVE       ASSESSMENT/PLAN   56 year old pleasant African-American female seen today for moderate persistent asthma related to recent  COVID-19 infection with persistent symptoms of wheezing coughing and increased work of breathing and shortness of breath, associated with morbid obesity in the setting of deconditioned state with obstructive sleep apnea with a history of excessive daytime sleepiness fatigue snoring and choking episodes   Severely persistent asthma secondary to COVID-19 infection At this time patient does not want any more steroids Continue Symbicort and Spiriva as prescribed   Excessive daytime sleepiness with fatigue and snoring and choking Home SLeep study reveal AHI 10 CPAP titration study pending   Please avoid secondhand smoke exposure  Obesity -recommend significant weight loss -recommend changing diet  Deconditioned state -Recommend increased daily activity and exercise     MEDICATION ADJUSTMENTS/LABS AND TESTS ORDERED: Follow-up CPAP titration study Recommend nasal pillows for therapy  Continue Symbicort as prescribed Continue Spiriva as prescribed Albuterol as needed  Please avoid secondhand smoke  Recommend weight loss  CURRENT MEDICATIONS REVIEWED AT LENGTH WITH PATIENT TODAY   Patient  satisfied with Plan of action and management. All questions answered  Follow up 6 months  Total Time Spent 22 mins   Maretta Bees Patricia Pesa, M.D.  [facility]  Medical Director [place] Director [facility] Cardio-Pulmonary Department

## 2021-08-11 NOTE — Patient Instructions (Addendum)
Follow-up CPAP titration study Recommend nasal pillows for therapy  Continue Symbicort as prescribed Continue Spiriva as prescribed Albuterol as needed

## 2021-09-01 ENCOUNTER — Other Ambulatory Visit: Payer: Self-pay | Admitting: Family Medicine

## 2021-09-01 ENCOUNTER — Ambulatory Visit: Payer: BC Managed Care – PPO | Admitting: Family Medicine

## 2021-09-01 ENCOUNTER — Other Ambulatory Visit: Payer: Self-pay

## 2021-09-01 ENCOUNTER — Encounter: Payer: Self-pay | Admitting: Family Medicine

## 2021-09-01 VITALS — BP 106/55 | HR 85 | Resp 20 | Ht 61.0 in | Wt 286.0 lb

## 2021-09-01 DIAGNOSIS — R110 Nausea: Secondary | ICD-10-CM

## 2021-09-01 DIAGNOSIS — Q181 Preauricular sinus and cyst: Secondary | ICD-10-CM | POA: Diagnosis not present

## 2021-09-01 DIAGNOSIS — R5382 Chronic fatigue, unspecified: Secondary | ICD-10-CM | POA: Diagnosis not present

## 2021-09-01 DIAGNOSIS — T782XXD Anaphylactic shock, unspecified, subsequent encounter: Secondary | ICD-10-CM

## 2021-09-01 DIAGNOSIS — L282 Other prurigo: Secondary | ICD-10-CM

## 2021-09-01 DIAGNOSIS — R21 Rash and other nonspecific skin eruption: Secondary | ICD-10-CM

## 2021-09-01 MED ORDER — PROMETHAZINE HCL 25 MG PO TABS: 25.0000 mg | ORAL_TABLET | Freq: Three times a day (TID) | ORAL | 0 refills | Status: DC | PRN

## 2021-09-01 MED ORDER — WEGOVY 0.25 MG/0.5ML ~~LOC~~ SOAJ: 0.2500 mg | SUBCUTANEOUS | 0 refills | Status: DC

## 2021-09-01 MED ORDER — TRIAMCINOLONE ACETONIDE 0.5 % EX CREA: 1.0000 "application " | TOPICAL_CREAM | Freq: Three times a day (TID) | CUTANEOUS | 2 refills | Status: DC

## 2021-09-01 MED ORDER — NEOMYCIN-POLYMYXIN-HC 3.5-10000-1 OT SOLN: 3.0000 [drp] | Freq: Four times a day (QID) | OTIC | 0 refills | Status: AC

## 2021-09-01 MED ORDER — CYANOCOBALAMIN 1000 MCG/ML IJ SOLN
1000.0000 ug | Freq: Once | INTRAMUSCULAR | Status: AC
  Administered 2021-09-01: 1000 ug via INTRAMUSCULAR

## 2021-09-01 MED ORDER — CEPHALEXIN 500 MG PO CAPS: 500.0000 mg | ORAL_CAPSULE | Freq: Four times a day (QID) | ORAL | 0 refills | Status: AC

## 2021-09-01 NOTE — Progress Notes (Unsigned)
I,Marlee Trentman L Aarian Cleaver,acting as a scribe for Lelon Huh, MD.,have documented all relevant documentation on the behalf of Lelon Huh, MD,as directed by  Lelon Huh, MD while in the presence of Lelon Huh, MD.   Established patient visit   Patient: Erin Sherman   DOB: Feb 01, 1966   56 y.o. Female  MRN: [id number] Visit Date: 09/01/2021  Today's healthcare provider: Lelon Huh, MD   Chief Complaint  Patient presents with   Ear Pain   Obesity   Subjective    Otalgia  There is pain in the right ear. This is a new problem. Episode onset: 2 weeks ago. The problem has been gradually worsening. There has been no fever. Pertinent negatives include no abdominal pain, coughing or ear discharge. Treatments tried: peroxide. The treatment provided no relief.   Patient believes a piece of her earring fell into her ear canal 2 weeks ago.  Obesity: Patient would like to discuss weight loss options. She has been taking Phentermine 37.5mg  daily with no improvement.  Medications: Outpatient Medications Prior to Visit  Medication Sig   albuterol (VENTOLIN HFA) 108 (90 Base) MCG/ACT inhaler TAKE 2 PUFFS BY MOUTH EVERY 6 HOURS AS NEEDED   aspirin 81 MG EC tablet Take 1 tablet (81 mg total) by mouth daily. Swallow whole.   azelastine (ASTELIN) 0.1 % nasal spray Place 2 sprays into both nostrils 2 (two) times daily. Use in each nostril as directed   budesonide-formoterol (SYMBICORT) 160-4.5 MCG/ACT inhaler Inhale 2 puffs into the lungs 2 (two) times daily.   calcium carbonate (TUMS - DOSED IN MG ELEMENTAL CALCIUM) 500 MG chewable tablet Chew 1 tablet by mouth daily as needed for indigestion.   cholecalciferol (VITAMIN D) 1000 UNITS tablet Take 1,000 Units by mouth daily.   EPINEPHrine (EPIPEN 2-PAK) 0.3 mg/0.3 mL IJ SOAJ injection Inject 0.3 mLs (0.3 mg total) into the muscle as needed. With allergic reaction   ipratropium-albuterol (DUONEB) 0.5-2.5 (3) MG/3ML SOLN Inhale 3 mLs into  the lungs every 6 (six) hours as needed. Reported on 12/28/2015   levalbuterol (XOPENEX HFA) 45 MCG/ACT inhaler Inhale 2 puffs into the lungs every 6 (six) hours as needed for wheezing. May substitute generic   mometasone (NASONEX) 50 MCG/ACT nasal spray Place 2 sprays into the nose daily.   oxyCODONE-acetaminophen (PERCOCET) 7.5-325 MG tablet Take 1 tablet by mouth every 6 (six) hours as needed for severe pain.   phentermine 37.5 MG capsule Take 1 capsule (37.5 mg total) by mouth every morning. DO NOT TAKE THIS MEDICATION WHEN TAKING ZYRTEC-D   promethazine (PHENERGAN) 25 MG tablet Take 1 tablet (25 mg total) by mouth every 8 (eight) hours as needed for nausea.   Spacer/Aero-Holding Ladaja Yusupov (OPTICHAMBER ADVANTAGE) MISC 1 each by Other route once. Always uses her when you're using a metered-dose inhaler. You've aromatase medicine as much, he won't have his much side effect, but you it twice as much medicine and your lungs.   Tiotropium Bromide Monohydrate (SPIRIVA RESPIMAT) 2.5 MCG/ACT AERS Inhale 2 puffs into the lungs daily.   triamcinolone cream (KENALOG) 0.5 % Apply 1 application topically 3 (three) times daily.   ZYRTEC-D ALLERGY & CONGESTION 5-120 MG tablet TAKE 1 TABLET BY MOUTH DAILY. AS NEEDED FOR ALLERGIES CAN RAISE BLOOD PRESSURE   benzonatate (TESSALON) 200 MG capsule Take 1 capsule (200 mg total) by mouth 2 (two) times daily as needed for cough. (Patient not taking: Reported on 09/01/2021)   predniSONE (DELTASONE) 10 MG tablet 6 TABLETS FOR  1 DAY, THEN 5 X 1 DAY, THEN 4 X 1 DAY, THEN 3 X 1 DAY, THEN 2 X 1 DAY THEN 1 DAILY (Patient not taking: Reported on 09/01/2021)   No facility-administered medications prior to visit.    Review of Systems  HENT:  Positive for ear pain. Negative for ear discharge.   Respiratory:  Negative for cough.   Gastrointestinal:  Negative for abdominal pain.   {Labs   Heme   Chem   Endocrine   Serology   Results Review (optional):23779}   Objective    BP (!)  106/55 (BP Location: Right Arm, Patient Position: Sitting, Cuff Size: Large)    Pulse 85    Resp 20    Ht 5\' 1"  (1.549 m)    Wt 286 lb (129.7 kg)    BMI 54.04 kg/m  {Show previous vital signs (optional):23777}  Physical Exam  ***  No results found for any visits on 09/01/21.  Assessment & Plan     ***  No follow-ups on file.      {provider attestation***:1}   Lelon Huh, MD  [facility] [phone] (phone) [phone] (fax)  [place]

## 2021-10-06 ENCOUNTER — Other Ambulatory Visit: Payer: Self-pay

## 2021-10-06 ENCOUNTER — Ambulatory Visit (INDEPENDENT_AMBULATORY_CARE_PROVIDER_SITE_OTHER): Payer: BC Managed Care – PPO

## 2021-10-06 DIAGNOSIS — L282 Other prurigo: Secondary | ICD-10-CM

## 2021-10-06 DIAGNOSIS — R5382 Chronic fatigue, unspecified: Secondary | ICD-10-CM

## 2021-10-06 DIAGNOSIS — R21 Rash and other nonspecific skin eruption: Secondary | ICD-10-CM

## 2021-10-06 MED ORDER — CYANOCOBALAMIN 1000 MCG/ML IJ SOLN
1000.0000 ug | Freq: Once | INTRAMUSCULAR | Status: AC
  Administered 2021-10-06: 1000 ug via INTRAMUSCULAR

## 2021-10-06 NOTE — Telephone Encounter (Signed)
Patient requesting refill on phentramine 37.'5mg'$ . ?

## 2021-10-07 MED ORDER — TRIAMCINOLONE ACETONIDE 0.5 % EX CREA: 1.0000 "application " | TOPICAL_CREAM | Freq: Three times a day (TID) | CUTANEOUS | 2 refills | Status: DC

## 2021-10-11 ENCOUNTER — Telehealth: Payer: Self-pay | Admitting: Family Medicine

## 2021-10-11 ENCOUNTER — Ambulatory Visit: Payer: BC Managed Care – PPO | Admitting: Family Medicine

## 2021-10-11 ENCOUNTER — Encounter: Payer: Self-pay | Admitting: Family Medicine

## 2021-10-11 VITALS — BP 119/76 | HR 87 | Temp 98.6°F | Resp 16 | Wt 278.8 lb

## 2021-10-11 DIAGNOSIS — J329 Chronic sinusitis, unspecified: Secondary | ICD-10-CM

## 2021-10-11 DIAGNOSIS — J4540 Moderate persistent asthma, uncomplicated: Secondary | ICD-10-CM | POA: Diagnosis not present

## 2021-10-11 DIAGNOSIS — U071 COVID-19: Secondary | ICD-10-CM

## 2021-10-11 MED ORDER — AMOXICILLIN-POT CLAVULANATE 875-125 MG PO TABS: 1.0000 | ORAL_TABLET | Freq: Two times a day (BID) | ORAL | 0 refills | Status: DC

## 2021-10-11 MED ORDER — BUDESONIDE-FORMOTEROL FUMARATE 160-4.5 MCG/ACT IN AERO: 2.0000 | INHALATION_SPRAY | Freq: Two times a day (BID) | RESPIRATORY_TRACT | 12 refills | Status: DC

## 2021-10-11 MED ORDER — SPIRIVA RESPIMAT 2.5 MCG/ACT IN AERS: 2.0000 | INHALATION_SPRAY | Freq: Every day | RESPIRATORY_TRACT | 1 refills | Status: DC

## 2021-10-11 MED ORDER — BENZONATATE 200 MG PO CAPS: 200.0000 mg | ORAL_CAPSULE | Freq: Two times a day (BID) | ORAL | 0 refills | Status: DC | PRN

## 2021-10-11 MED ORDER — PHENTERMINE HCL 37.5 MG PO CAPS: 37.5000 mg | ORAL_CAPSULE | ORAL | 3 refills | Status: DC

## 2021-10-11 MED ORDER — ALBUTEROL SULFATE HFA 108 (90 BASE) MCG/ACT IN AERS: INHALATION_SPRAY | RESPIRATORY_TRACT | 5 refills | Status: DC

## 2021-10-11 NOTE — Telephone Encounter (Signed)
Appointment scheduled today at 1:40pm with Dr. Caryn Section.  ?

## 2021-10-11 NOTE — Telephone Encounter (Signed)
Patient called stating she has a sinus infection. She wanted an appointment but non were available. She would like something called into her pharmacy. ?

## 2021-10-11 NOTE — Progress Notes (Signed)
I,Jana Robinson,acting as a scribe for Lelon Huh, MD.,have documented all relevant documentation on the behalf of Lelon Huh, MD,as directed by  Lelon Huh, MD while in the presence of Lelon Huh, MD.   Established patient visit   Patient: Erin Sherman   DOB: [birth year]   56 y.o. Female  MRN: [id number] Visit Date: 10/11/2021  Today's healthcare provider: Lelon Huh, MD   Chief Complaint  Patient presents with   Sinus Problem   Subjective    HPI  Upper respiratory symptoms She complains of congestion, cough described as productive, facial pain, nasal congestion, productive cough with  yellow colored sputum, shortness of breath, and sinus pressure.with no fever, chills, night sweats or weight loss. Onset of symptoms was a few days ago and staying constant.She is not drinking much.  Past history is significant for asthma. Patient is non-smoker  ---------------------------------------------------------------------------------------------------   Medications: Outpatient Medications Prior to Visit  Medication Sig   albuterol (VENTOLIN HFA) 108 (90 Base) MCG/ACT inhaler TAKE 2 PUFFS BY MOUTH EVERY 6 HOURS AS NEEDED   aspirin 81 MG EC tablet Take 1 tablet (81 mg total) by mouth daily. Swallow whole.   azelastine (ASTELIN) 0.1 % nasal spray Place 2 sprays into both nostrils 2 (two) times daily. Use in each nostril as directed   benzonatate (TESSALON) 200 MG capsule Take 1 capsule (200 mg total) by mouth 2 (two) times daily as needed for cough.   budesonide-formoterol (SYMBICORT) 160-4.5 MCG/ACT inhaler Inhale 2 puffs into the lungs 2 (two) times daily.   calcium carbonate (TUMS - DOSED IN MG ELEMENTAL CALCIUM) 500 MG chewable tablet Chew 1 tablet by mouth daily as needed for indigestion.   cholecalciferol (VITAMIN D) 1000 UNITS tablet Take 1,000 Units by mouth daily.   EPINEPHRINE 0.3 mg/0.3 mL IJ SOAJ injection INJECT 0.3 MLS (0.3 MG TOTAL) INTO THE MUSCLE AS  NEEDED. WITH ALLERGIC REACTION   ipratropium-albuterol (DUONEB) 0.5-2.5 (3) MG/3ML SOLN Inhale 3 mLs into the lungs every 6 (six) hours as needed. Reported on 12/28/2015   levalbuterol (XOPENEX HFA) 45 MCG/ACT inhaler Inhale 2 puffs into the lungs every 6 (six) hours as needed for wheezing. May substitute generic   mometasone (NASONEX) 50 MCG/ACT nasal spray Place 2 sprays into the nose daily.   oxyCODONE-acetaminophen (PERCOCET) 7.5-325 MG tablet Take 1 tablet by mouth every 6 (six) hours as needed for severe pain.   predniSONE (DELTASONE) 10 MG tablet 6 TABLETS FOR 1 DAY, THEN 5 X 1 DAY, THEN 4 X 1 DAY, THEN 3 X 1 DAY, THEN 2 X 1 DAY THEN 1 DAILY   promethazine (PHENERGAN) 25 MG tablet Take 1 tablet (25 mg total) by mouth every 8 (eight) hours as needed for nausea.   Semaglutide-Weight Management (WEGOVY) 0.25 MG/0.5ML SOAJ Inject 0.25 mg into the skin once a week. For 4 weeks, then increase 0.'5mg'$  weekly   Spacer/Aero-Holding Chambers (OPTICHAMBER ADVANTAGE) MISC 1 each by Other route once. Always uses her when you're using a metered-dose inhaler. You've aromatase medicine as much, he won't have his much side effect, but you it twice as much medicine and your lungs.   Tiotropium Bromide Monohydrate (SPIRIVA RESPIMAT) 2.5 MCG/ACT AERS Inhale 2 puffs into the lungs daily.   triamcinolone cream (KENALOG) 0.5 % Apply 1 application. topically 3 (three) times daily.   ZYRTEC-D ALLERGY & CONGESTION 5-120 MG tablet TAKE 1 TABLET BY MOUTH DAILY. AS NEEDED FOR ALLERGIES CAN RAISE BLOOD PRESSURE   No facility-administered medications prior  to visit.    Review of Systems     Objective    BP 119/76 (BP Location: Left Arm, Patient Position: Sitting, Cuff Size: Large)   Pulse 87   Temp 98.6 F (37 C) (Oral)   Resp 16   Wt 278 lb 12.8 oz (126.5 kg)   SpO2 98%   BMI 52.68 kg/m    Physical Exam   General: Appearance:    Obese female in no acute distress  Eyes:    PERRL, conjunctiva/corneas clear,  EOM's intact       Lungs:     Clear to auscultation bilaterally, respirations unlabored  Heart:    Normal heart rate. Normal rhythm. No murmurs, rubs, or gallops.    MS:   All extremities are intact.    Neurologic:   Awake, alert, oriented x 3. No apparent focal neurological defect.         Assessment & Plan     1. Sinusitis, unspecified chronicity, unspecified location Augmentin 874-125 BID x 10 days.   2. Asthma, chronic, moderate persistent, uncomplicated refill - budesonide-formoterol (SYMBICORT) 160-4.5 MCG/ACT inhaler; Inhale 2 puffs into the lungs 2 (two) times daily.  Dispense: 1 each; Refill: 12 - benzonatate (TESSALON) 200 MG capsule; Take 1 capsule (200 mg total) by mouth 2 (two) times daily as needed for cough.  Dispense: 20 capsule; Refill: 0 - albuterol (VENTOLIN HFA) 108 (90 Base) MCG/ACT inhaler; TAKE 2 PUFFS BY MOUTH EVERY 6 HOURS AS NEEDED  Dispense: 6.7 g; Refill: 5 - Tiotropium Bromide Monohydrate (SPIRIVA RESPIMAT) 2.5 MCG/ACT AERS; Inhale 2 puffs into the lungs daily.  Dispense: 1 each; Refill: 1  3. Morbid obesity (HCC) Phentermine 37.5 QAM. Consider trial or semaglutide.       The entirety of the information documented in the History of Present Illness, Review of Systems and Physical Exam were personally obtained by me. Portions of this information were initially documented by the CMA and reviewed by me for thoroughness and accuracy.     Lelon Huh, MD  [facility] [phone] (phone) [phone] (fax)  [place]

## 2021-10-18 ENCOUNTER — Telehealth: Payer: Self-pay | Admitting: Family Medicine

## 2021-10-18 MED ORDER — WEGOVY 0.5 MG/0.5ML ~~LOC~~ SOAJ: 0.5000 mg | SUBCUTANEOUS | 0 refills | Status: DC

## 2021-10-18 NOTE — Telephone Encounter (Signed)
Patient called stating that she needed a refill for the new dosage of Wegovy. She is currently on the 0.'25mg'$  and was told after 4 weeks she will be increased to the 0.'5mg'$ . She stated that she needs it today. ? ?Preferred pharmacy has been confirmed as [facility] in [place]. ? ?Patient's contact information has been confirmed if you should have any questions. ? ? ?

## 2021-11-03 ENCOUNTER — Ambulatory Visit: Payer: BC Managed Care – PPO | Admitting: Primary Care

## 2021-11-03 NOTE — Progress Notes (Signed)
?  ? ? ?I,Jana Robinson,acting as a scribe for Lelon Huh, MD.,have documented all relevant documentation on the behalf of Lelon Huh, MD,as directed by  Lelon Huh, MD while in the presence of Lelon Huh, MD. ? ?Established patient visit ? ? ?Patient: Erin Sherman   DOB: 1965/10/19   56 y.o. Female  MRN: [id number] ?Visit Date: 11/05/2021 ? ?Today's healthcare provider: Lelon Huh, MD  ? ?Chief Complaint  ?Patient presents with  ? Hip Pain  ?  B-12  ? ?Subjective  ?  ?Hip Pain  ? ?HPI   ? ? Hip Pain   ? Additional comments: B-12 ? ?  ?  ?Last edited by Alanson Puls, CMA on 11/05/2021  8:38 AM.  ?  ?  ?Patient presents for continued hip pain/right. No improvement and gets worse with the weather.   ?Reports she takes oxycodone when the pain is really bad.    ? ?Has had several steroid injections in the past which have provided temporary relief.  ? ?She is also due to follow up on morbid obesity and tolerating current dose of Wegovy well with no adverse effects. Feels decrease appetite.  ? ?Wt Readings from Last 3 Encounters:  ?11/05/21 272 lb 14.4 oz (123.8 kg)  ?10/11/21 278 lb 12.8 oz (126.5 kg)  ?09/01/21 286 lb (129.7 kg)  ? ? ?Last B12 injection 10/06/2021 ? ?Medications: ?Outpatient Medications Prior to Visit  ?Medication Sig  ? albuterol (VENTOLIN HFA) 108 (90 Base) MCG/ACT inhaler TAKE 2 PUFFS BY MOUTH EVERY 6 HOURS AS NEEDED  ? amoxicillin-clavulanate (AUGMENTIN) 875-125 MG tablet Take 1 tablet by mouth 2 (two) times daily.  ? aspirin 81 MG EC tablet Take 1 tablet (81 mg total) by mouth daily. Swallow whole.  ? azelastine (ASTELIN) 0.1 % nasal spray Place 2 sprays into both nostrils 2 (two) times daily. Use in each nostril as directed  ? benzonatate (TESSALON) 200 MG capsule Take 1 capsule (200 mg total) by mouth 2 (two) times daily as needed for cough.  ? budesonide-formoterol (SYMBICORT) 160-4.5 MCG/ACT inhaler Inhale 2 puffs into the lungs 2 (two) times daily.  ? calcium carbonate  (TUMS - DOSED IN MG ELEMENTAL CALCIUM) 500 MG chewable tablet Chew 1 tablet by mouth daily as needed for indigestion.  ? cholecalciferol (VITAMIN D) 1000 UNITS tablet Take 1,000 Units by mouth daily.  ? EPINEPHRINE 0.3 mg/0.3 mL IJ SOAJ injection INJECT 0.3 MLS (0.3 MG TOTAL) INTO THE MUSCLE AS NEEDED. WITH ALLERGIC REACTION  ? ipratropium-albuterol (DUONEB) 0.5-2.5 (3) MG/3ML SOLN Inhale 3 mLs into the lungs every 6 (six) hours as needed. Reported on 12/28/2015  ? levalbuterol (XOPENEX HFA) 45 MCG/ACT inhaler Inhale 2 puffs into the lungs every 6 (six) hours as needed for wheezing. May substitute generic  ? mometasone (NASONEX) 50 MCG/ACT nasal spray Place 2 sprays into the nose daily.  ? oxyCODONE-acetaminophen (PERCOCET) 7.5-325 MG tablet Take 1 tablet by mouth every 6 (six) hours as needed for severe pain.  ? phentermine 37.5 MG capsule Take 1 capsule (37.5 mg total) by mouth every morning. DO NOT TAKE THIS MEDICATION WHEN TAKING ZYRTEC-D  ? predniSONE (DELTASONE) 10 MG tablet 6 TABLETS FOR 1 DAY, THEN 5 X 1 DAY, THEN 4 X 1 DAY, THEN 3 X 1 DAY, THEN 2 X 1 DAY THEN 1 DAILY  ? promethazine (PHENERGAN) 25 MG tablet Take 1 tablet (25 mg total) by mouth every 8 (eight) hours as needed for nausea.  ? Semaglutide-Weight Management (WEGOVY) 0.5  MG/0.5ML SOAJ Inject 0.5 mg into the skin once a week.  ? Spacer/Aero-Holding Chambers (OPTICHAMBER ADVANTAGE) MISC 1 each by Other route once. Always uses her when you're using a metered-dose inhaler. ?You've aromatase medicine as much, he won't have his much side effect, but you it twice as much medicine and your lungs.  ? Tiotropium Bromide Monohydrate (SPIRIVA RESPIMAT) 2.5 MCG/ACT AERS Inhale 2 puffs into the lungs daily.  ? triamcinolone cream (KENALOG) 0.5 % Apply 1 application. topically 3 (three) times daily.  ? ZYRTEC-D ALLERGY & CONGESTION 5-120 MG tablet TAKE 1 TABLET BY MOUTH DAILY. AS NEEDED FOR ALLERGIES CAN RAISE BLOOD PRESSURE  ? ?No facility-administered  medications prior to visit.  ? ? ? ? ?  Objective  ?  ?BP 104/64 (BP Location: Left Arm, Patient Position: Sitting, Cuff Size: Large)   Pulse 97   Temp 98.5 ?F (36.9 ?C) (Oral)   Resp 16   Wt 272 lb 14.4 oz (123.8 kg)   SpO2 99%   BMI 51.56 kg/m?  ? ? ?Physical Exam  ?Moderately tender over right greater trochanter ? Assessment & Plan  ?  ? ?1. Chronic fatigue ? ?- cyanocobalamin ((VITAMIN B-12)) injection 1,000 mcg ? ?2. Trochanteric bursitis of right hip ? ?- meloxicam (MOBIC) 15 MG tablet; Take 1 tablet (15 mg total) by mouth daily.  Dispense: 30 tablet; Refill: 2 ?Prepped skin over right greater trochanter with isopropyl alcohol and injected1cc 1% lidocaine mixed with '80mg'$  depot-medrol ? ?3. Lumbar radiculitis ? ?4. Chronic midline low back pain without sciatica ? ?5. DDD (degenerative disc disease), lumbar ?Refill oxycodone/apap  ? ?6. Morbid obesity ([place]) ?Doing well on 0.'5mg'$  Wegovy and starting to lose week. Increase to  Semaglutide-Weight Management (WEGOVY) 1 MG/0.5ML SOAJ; Inject 1 mg into the skin once a week.  Dispense: 2 mL; Refill: 0 ?- phentermine 37.5 MG capsule; Take 1 capsule (37.5 mg total) by mouth every morning. DO NOT TAKE THIS MEDICATION WHEN TAKING ZYRTEC-D  Dispense: 30 capsule; Refill: 3  ? ?   ? ?The entirety of the information documented in the History of Present Illness, Review of Systems and Physical Exam were personally obtained by me. Portions of this information were initially documented by the CMA and reviewed by me for thoroughness and accuracy.   ? ? ?Lelon Huh, MD  ?[facility] ?[phone] (phone) ?[phone] (fax) ? ?[facility]  ?

## 2021-11-05 ENCOUNTER — Ambulatory Visit: Payer: BC Managed Care – PPO | Admitting: Family Medicine

## 2021-11-05 ENCOUNTER — Encounter: Payer: Self-pay | Admitting: Family Medicine

## 2021-11-05 VITALS — BP 104/64 | HR 97 | Temp 98.5°F | Resp 16 | Wt 272.9 lb

## 2021-11-05 DIAGNOSIS — G8929 Other chronic pain: Secondary | ICD-10-CM

## 2021-11-05 DIAGNOSIS — M7061 Trochanteric bursitis, right hip: Secondary | ICD-10-CM | POA: Diagnosis not present

## 2021-11-05 DIAGNOSIS — M545 Low back pain, unspecified: Secondary | ICD-10-CM | POA: Diagnosis not present

## 2021-11-05 DIAGNOSIS — M5416 Radiculopathy, lumbar region: Secondary | ICD-10-CM

## 2021-11-05 DIAGNOSIS — R5382 Chronic fatigue, unspecified: Secondary | ICD-10-CM

## 2021-11-05 DIAGNOSIS — M5136 Other intervertebral disc degeneration, lumbar region: Secondary | ICD-10-CM

## 2021-11-05 DIAGNOSIS — M51369 Other intervertebral disc degeneration, lumbar region without mention of lumbar back pain or lower extremity pain: Secondary | ICD-10-CM

## 2021-11-05 MED ORDER — MELOXICAM 15 MG PO TABS: 15.0000 mg | ORAL_TABLET | Freq: Every day | ORAL | 2 refills | Status: DC

## 2021-11-05 MED ORDER — OXYCODONE-ACETAMINOPHEN 7.5-325 MG PO TABS: 1.0000 | ORAL_TABLET | Freq: Four times a day (QID) | ORAL | 0 refills | Status: DC | PRN

## 2021-11-05 MED ORDER — WEGOVY 1 MG/0.5ML ~~LOC~~ SOAJ: 1.0000 mg | SUBCUTANEOUS | 0 refills | Status: DC

## 2021-11-05 MED ORDER — PHENTERMINE HCL 37.5 MG PO CAPS: 37.5000 mg | ORAL_CAPSULE | ORAL | 3 refills | Status: DC

## 2021-11-05 MED ORDER — CYANOCOBALAMIN 1000 MCG/ML IJ SOLN
1000.0000 ug | Freq: Once | INTRAMUSCULAR | Status: AC
  Administered 2021-11-05: 1000 ug via INTRAMUSCULAR

## 2021-11-05 NOTE — Patient Instructions (Signed)
.   Please review the attached list of medications and notify my office if there are any errors.   . Please bring all of your medications to every appointment so we can make sure that our medication list is the same as yours.   

## 2021-12-01 ENCOUNTER — Ambulatory Visit (INDEPENDENT_AMBULATORY_CARE_PROVIDER_SITE_OTHER): Payer: BC Managed Care – PPO

## 2021-12-01 DIAGNOSIS — R5382 Chronic fatigue, unspecified: Secondary | ICD-10-CM

## 2021-12-01 MED ORDER — CYANOCOBALAMIN 1000 MCG/ML IJ SOLN
1000.0000 ug | Freq: Once | INTRAMUSCULAR | Status: AC
  Administered 2021-12-01: 1000 ug via INTRAMUSCULAR

## 2021-12-13 ENCOUNTER — Telehealth: Payer: Self-pay

## 2021-12-13 ENCOUNTER — Other Ambulatory Visit: Payer: Self-pay | Admitting: Family Medicine

## 2021-12-13 NOTE — Telephone Encounter (Signed)
Copied from [place] [id number]. Topic: General - Other >> Dec 13, 2021  4:20 PM [place] B wrote: Reason for CRM:pt called in to schedule appt for wegovy shot, not sure if this is supposed to be scheduled with provider. Please call back to confirm.

## 2021-12-15 ENCOUNTER — Other Ambulatory Visit: Payer: Self-pay | Admitting: Family Medicine

## 2021-12-15 MED ORDER — WEGOVY 1 MG/0.5ML ~~LOC~~ SOAJ: 1.0000 mg | SUBCUTANEOUS | 0 refills | Status: DC

## 2021-12-15 NOTE — Telephone Encounter (Signed)
Medication Refill - Medication:  Semaglutide-Weight Management (WEGOVY) 1 MG/0.5ML SOAJ   Has the patient contacted their pharmacy? Yes.   Contact PCP  Preferred Pharmacy (with phone number or street name):  [id number] [place] [address] [id number] Phone:  [phone] Fax:  [phone]  Has the patient been seen for an appointment in the last year OR does the patient have an upcoming appointment? Yes.    Agent: Please be advised that RX refills may take up to 3 business days. We ask that you follow-up with your pharmacy.

## 2021-12-15 NOTE — Telephone Encounter (Signed)
The dose changes each month depending on how well the medication is tolerated. Only prescribe one month at a time until the maintenance dose of 2.'4mg'$  is reached.

## 2021-12-15 NOTE — Telephone Encounter (Signed)
Pt states she is not requesting an appt for injection.  Her request is for a refill so she can stay on schedule.  Requesting Rx sent for 90 days so she doesn't have to call in every month.

## 2021-12-15 NOTE — Telephone Encounter (Signed)
Pt informed

## 2021-12-15 NOTE — Telephone Encounter (Signed)
Requested Prescriptions  Pending Prescriptions Disp Refills  . Semaglutide-Weight Management (WEGOVY) 1 MG/0.5ML SOAJ 2 mL 0    Sig: Inject 1 mg into the skin once a week.     Endocrinology:  Diabetes - GLP-1 Receptor Agonists - semaglutide Failed - 12/15/2021 10:21 AM      Failed - HBA1C in normal range and within 180 days    No results found for: "HGBA1C", "LABA1C"       Passed - Cr in normal range and within 360 days    Creatinine  Date Value Ref Range Status  09/30/2014 0.70 mg/dL Final    Comment:    0.44-1.00 NOTE: New Reference Range  09/09/14    Creatinine, Ser  Date Value Ref Range Status  01/17/2021 0.67 0.44 - 1.00 mg/dL Final         Passed - Valid encounter within last 6 months    Recent Outpatient Visits          1 month ago Chronic fatigue   [facility] Birdie Sons, MD   2 months ago Sinusitis, unspecified chronicity, unspecified location   [facility] Birdie Sons, MD   3 months ago Cyst of ear canal   [facility] Birdie Sons, MD   6 months ago Chronic midline low back pain without sciatica   [facility] Birdie Sons, MD   8 months ago Post-COVID chronic dyspnea   [facility] Birdie Sons, MD

## 2021-12-20 ENCOUNTER — Other Ambulatory Visit: Payer: Self-pay | Admitting: Family Medicine

## 2021-12-20 NOTE — Telephone Encounter (Signed)
Copied from [place] [id number]. Topic: General - Other >> Dec 20, 2021  9:24 AM Everette C wrote: Reason for CRM: The patient has called to share that their phentermine 37.5 MG capsule [[id number]] prescription is currently out of stock   The patient would like to continue taking their previously prescribed Semaglutide-Weight Management (WEGOVY) 1 MG/0.5ML Darden Palmer [[id number]]   The patient would like a prescription sent to their preferred pharmacy [id number] [place] [id number]: [phone]: [phone]: Not open 24 hours  Please contact further when possible

## 2021-12-21 NOTE — Telephone Encounter (Signed)
[facility] see if [facility] the 1.7 mg dose of Wegovy, or if they have '1mg'$  Ozempic

## 2021-12-21 NOTE — Telephone Encounter (Signed)
Called patient to inform medication is out of stock at [facility] and if she has another pharmacy that has medication in stock to notify practice and request can be sent to that pharmacy. Patient reports medication , Erin Sherman has been working very well . Requesting if PCP can prescribe an alternative to keep her on track for weight loss.

## 2021-12-21 NOTE — Telephone Encounter (Signed)
[facility] and staff reports medication Foye Deer is on back order. Lower strength will not be available until the fall.

## 2021-12-21 NOTE — Telephone Encounter (Signed)
Requested medication (s) are due for refill today: yes   Requested medication (s) are on the active medication list: yes  Last refill:  12/15/21 #2 ml 0 refills  Future visit scheduled: no   Notes to clinic:  medication on back order. [facility] and med may not be available in lower doses until the Fall. Contacted patient if she would like request sent to another pharmacy notify clinic. Patient reports she has had great success with medication and would like to know if PCP can prescribe alternative medication. Please advise      Requested Prescriptions  Pending Prescriptions Disp Refills   Semaglutide-Weight Management (WEGOVY) 1 MG/0.5ML SOAJ 2 mL 0    Sig: Inject 1 mg into the skin once a week.     Endocrinology:  Diabetes - GLP-1 Receptor Agonists - semaglutide Failed - 12/20/2021  9:45 AM      Failed - HBA1C in normal range and within 180 days    No results found for: "HGBA1C", "LABA1C"       Passed - Cr in normal range and within 360 days    Creatinine  Date Value Ref Range Status  09/30/2014 0.70 mg/dL Final    Comment:    0.44-1.00 NOTE: New Reference Range  09/09/14    Creatinine, Ser  Date Value Ref Range Status  01/17/2021 0.67 0.44 - 1.00 mg/dL Final         Passed - Valid encounter within last 6 months    Recent Outpatient Visits           1 month ago Chronic fatigue   [facility] Birdie Sons, MD   2 months ago Sinusitis, unspecified chronicity, unspecified location   [facility] Birdie Sons, MD   3 months ago Cyst of ear canal   [facility] Birdie Sons, MD   6 months ago Chronic midline low back pain without sciatica   [facility] Birdie Sons, MD   8 months ago Post-COVID chronic dyspnea   [facility] Birdie Sons, MD

## 2021-12-27 NOTE — Telephone Encounter (Signed)
[facility] and spoke with pharmacist Trinna Post. I was advised that they do not have either medication in stock, but usually get a small shipment each week on both medications.

## 2021-12-29 ENCOUNTER — Ambulatory Visit: Payer: BC Managed Care – PPO

## 2021-12-29 DIAGNOSIS — R5382 Chronic fatigue, unspecified: Secondary | ICD-10-CM

## 2021-12-29 MED ORDER — CYANOCOBALAMIN 1000 MCG/ML IJ SOLN
1000.0000 ug | Freq: Once | INTRAMUSCULAR | Status: AC
  Administered 2021-12-29: 1000 ug via INTRAMUSCULAR

## 2022-01-10 ENCOUNTER — Ambulatory Visit: Payer: BC Managed Care – PPO | Admitting: Family Medicine

## 2022-01-10 ENCOUNTER — Encounter: Payer: Self-pay | Admitting: Family Medicine

## 2022-01-10 VITALS — BP 131/76 | HR 92 | Temp 98.1°F | Resp 20 | Wt 261.0 lb

## 2022-01-10 DIAGNOSIS — H6691 Otitis media, unspecified, right ear: Secondary | ICD-10-CM | POA: Diagnosis not present

## 2022-01-10 MED ORDER — AZITHROMYCIN 250 MG PO TABS: ORAL_TABLET | ORAL | 0 refills | Status: AC

## 2022-01-10 NOTE — Progress Notes (Unsigned)
I,Roshena L Chambers,acting as a scribe for Lelon Huh, MD.,have documented all relevant documentation on the behalf of Lelon Huh, MD,as directed by  Lelon Huh, MD while in the presence of Lelon Huh, MD.   Established patient visit   Patient: Erin Sherman   DOB: 1966/04/06   56 y.o. Female  MRN: [id number] Visit Date: 01/10/2022  Today's healthcare provider: Lelon Huh, MD   Chief Complaint  Patient presents with   Ear Pain   Subjective    Otalgia  There is pain in the right ear. This is a new problem. Episode onset: 2 days ago. The problem has been unchanged. There has been no fever. Pertinent negatives include no abdominal pain or vomiting.      Medications: Outpatient Medications Prior to Visit  Medication Sig   albuterol (VENTOLIN HFA) 108 (90 Base) MCG/ACT inhaler TAKE 2 PUFFS BY MOUTH EVERY 6 HOURS AS NEEDED   aspirin 81 MG EC tablet Take 1 tablet (81 mg total) by mouth daily. Swallow whole.   azelastine (ASTELIN) 0.1 % nasal spray Place 2 sprays into both nostrils 2 (two) times daily. Use in each nostril as directed   budesonide-formoterol (SYMBICORT) 160-4.5 MCG/ACT inhaler Inhale 2 puffs into the lungs 2 (two) times daily.   calcium carbonate (TUMS - DOSED IN MG ELEMENTAL CALCIUM) 500 MG chewable tablet Chew 1 tablet by mouth daily as needed for indigestion.   cholecalciferol (VITAMIN D) 1000 UNITS tablet Take 1,000 Units by mouth daily.   EPINEPHRINE 0.3 mg/0.3 mL IJ SOAJ injection INJECT 0.3 MLS (0.3 MG TOTAL) INTO THE MUSCLE AS NEEDED. WITH ALLERGIC REACTION   ipratropium-albuterol (DUONEB) 0.5-2.5 (3) MG/3ML SOLN Inhale 3 mLs into the lungs every 6 (six) hours as needed. Reported on 12/28/2015   levalbuterol (XOPENEX HFA) 45 MCG/ACT inhaler Inhale 2 puffs into the lungs every 6 (six) hours as needed for wheezing. May substitute generic   meloxicam (MOBIC) 15 MG tablet Take 1 tablet (15 mg total) by mouth daily.   mometasone (NASONEX) 50  MCG/ACT nasal spray Place 2 sprays into the nose daily.   oxyCODONE-acetaminophen (PERCOCET) 7.5-325 MG tablet Take 1 tablet by mouth every 6 (six) hours as needed for severe pain.   phentermine 37.5 MG capsule Take 1 capsule (37.5 mg total) by mouth every morning. DO NOT TAKE THIS MEDICATION WHEN TAKING ZYRTEC-D   promethazine (PHENERGAN) 25 MG tablet Take 1 tablet (25 mg total) by mouth every 8 (eight) hours as needed for nausea.   Semaglutide-Weight Management (WEGOVY) 1 MG/0.5ML SOAJ Inject 1 mg into the skin once a week.   Spacer/Aero-Holding Chambers (OPTICHAMBER ADVANTAGE) MISC 1 each by Other route once. Always uses her when you're using a metered-dose inhaler. You've aromatase medicine as much, he won't have his much side effect, but you it twice as much medicine and your lungs.   Tiotropium Bromide Monohydrate (SPIRIVA RESPIMAT) 2.5 MCG/ACT AERS Inhale 2 puffs into the lungs daily.   triamcinolone cream (KENALOG) 0.5 % Apply 1 application. topically 3 (three) times daily.   ZYRTEC-D ALLERGY & CONGESTION 5-120 MG tablet TAKE 1 TABLET BY MOUTH DAILY. AS NEEDED FOR ALLERGIES CAN RAISE BLOOD PRESSURE   [DISCONTINUED] benzonatate (TESSALON) 200 MG capsule Take 1 capsule (200 mg total) by mouth 2 (two) times daily as needed for cough.   No facility-administered medications prior to visit.    Review of Systems  Constitutional:  Negative for appetite change, chills, fatigue and fever.  HENT:  Positive for ear  pain and sneezing.   Respiratory:  Negative for chest tightness and shortness of breath.   Cardiovascular:  Negative for chest pain and palpitations.  Gastrointestinal:  Negative for abdominal pain, nausea and vomiting.       Bloated  Neurological:  Negative for dizziness and weakness.    {Labs  Heme  Chem  Endocrine  Serology  Results Review (optional):23779}   Objective    BP 131/76 (BP Location: Right Arm, Patient Position: Sitting, Cuff Size: Large)   Pulse 92   Temp  98.1 F (36.7 C) (Oral)   Resp 20   Wt 261 lb (118.4 kg)   SpO2 100% Comment: room air  BMI 49.32 kg/m  {Show previous vital signs (optional):23777}  Physical Exam  ***  No results found for any visits on 01/10/22.  Assessment & Plan     ***  No follow-ups on file.      {provider attestation***:1}   Lelon Huh, MD  [facility] [phone] (phone) [phone] (fax)  [place]

## 2022-01-26 ENCOUNTER — Ambulatory Visit: Payer: BC Managed Care – PPO | Admitting: Physician Assistant

## 2022-02-08 ENCOUNTER — Encounter: Payer: Self-pay | Admitting: Family Medicine

## 2022-02-08 ENCOUNTER — Ambulatory Visit (INDEPENDENT_AMBULATORY_CARE_PROVIDER_SITE_OTHER): Payer: BC Managed Care – PPO | Admitting: Family Medicine

## 2022-02-08 VITALS — BP 119/64 | HR 94 | Resp 18 | Wt 265.0 lb

## 2022-02-08 DIAGNOSIS — J309 Allergic rhinitis, unspecified: Secondary | ICD-10-CM

## 2022-02-08 DIAGNOSIS — G8929 Other chronic pain: Secondary | ICD-10-CM

## 2022-02-08 DIAGNOSIS — M51369 Other intervertebral disc degeneration, lumbar region without mention of lumbar back pain or lower extremity pain: Secondary | ICD-10-CM

## 2022-02-08 DIAGNOSIS — J0110 Acute frontal sinusitis, unspecified: Secondary | ICD-10-CM

## 2022-02-08 DIAGNOSIS — M5416 Radiculopathy, lumbar region: Secondary | ICD-10-CM | POA: Diagnosis not present

## 2022-02-08 DIAGNOSIS — J301 Allergic rhinitis due to pollen: Secondary | ICD-10-CM

## 2022-02-08 DIAGNOSIS — M5136 Other intervertebral disc degeneration, lumbar region: Secondary | ICD-10-CM

## 2022-02-08 DIAGNOSIS — M545 Low back pain, unspecified: Secondary | ICD-10-CM | POA: Diagnosis not present

## 2022-02-08 MED ORDER — WEGOVY 0.25 MG/0.5ML ~~LOC~~ SOAJ: SUBCUTANEOUS | 0 refills | Status: DC

## 2022-02-08 MED ORDER — AMOXICILLIN 500 MG PO CAPS: 1000.0000 mg | ORAL_CAPSULE | Freq: Two times a day (BID) | ORAL | 0 refills | Status: DC

## 2022-02-08 MED ORDER — MOMETASONE FUROATE 50 MCG/ACT NA SUSP: 2.0000 | Freq: Every day | NASAL | 3 refills | Status: DC

## 2022-02-08 MED ORDER — ZYRTEC-D ALLERGY & CONGESTION 5-120 MG PO TB12: ORAL_TABLET | ORAL | 5 refills | Status: DC

## 2022-02-08 MED ORDER — OXYCODONE-ACETAMINOPHEN 7.5-325 MG PO TABS: 1.0000 | ORAL_TABLET | Freq: Four times a day (QID) | ORAL | 0 refills | Status: DC | PRN

## 2022-02-08 NOTE — Progress Notes (Signed)
Established patient visit  I,April Miller,acting as a scribe for Lelon Huh, MD.,have documented all relevant documentation on the behalf of Lelon Huh, MD,as directed by  Lelon Huh, MD while in the presence of Lelon Huh, MD.   Patient: Erin Sherman   DOB: 1965-07-16   56 y.o. Female  MRN: [id number] Visit Date: 02/08/2022  Today's healthcare provider: Lelon Huh, MD   Chief Complaint  Patient presents with   Asthma   Subjective    HPI  Follow up for Chronic asthma without complication:  The patient was last seen for this 8 months ago. Changes made at last visit include continuing same inhalers. Has been followed by Dr. Mortimer Fries who she last saw in February and no changes were made at that time.   She reports good compliance with treatment. She feels that condition is Worse. She is not having side effects. none  ----------------------------------------------------------------------------------------- Patient is having shortness of breath, cough, wheezing and chest tightness. Needs refills on zyrtec, inhalers and nasonex.   Medications: Outpatient Medications Prior to Visit  Medication Sig   albuterol (VENTOLIN HFA) 108 (90 Base) MCG/ACT inhaler TAKE 2 PUFFS BY MOUTH EVERY 6 HOURS AS NEEDED   aspirin 81 MG EC tablet Take 1 tablet (81 mg total) by mouth daily. Swallow whole.   azelastine (ASTELIN) 0.1 % nasal spray Place 2 sprays into both nostrils 2 (two) times daily. Use in each nostril as directed   budesonide-formoterol (SYMBICORT) 160-4.5 MCG/ACT inhaler Inhale 2 puffs into the lungs 2 (two) times daily.   calcium carbonate (TUMS - DOSED IN MG ELEMENTAL CALCIUM) 500 MG chewable tablet Chew 1 tablet by mouth daily as needed for indigestion.   cholecalciferol (VITAMIN D) 1000 UNITS tablet Take 1,000 Units by mouth daily.   EPINEPHRINE 0.3 mg/0.3 mL IJ SOAJ injection INJECT 0.3 MLS (0.3 MG TOTAL) INTO THE MUSCLE AS NEEDED. WITH ALLERGIC REACTION    ipratropium-albuterol (DUONEB) 0.5-2.5 (3) MG/3ML SOLN Inhale 3 mLs into the lungs every 6 (six) hours as needed. Reported on 12/28/2015   levalbuterol (XOPENEX HFA) 45 MCG/ACT inhaler Inhale 2 puffs into the lungs every 6 (six) hours as needed for wheezing. May substitute generic   meloxicam (MOBIC) 15 MG tablet Take 1 tablet (15 mg total) by mouth daily.   mometasone (NASONEX) 50 MCG/ACT nasal spray Place 2 sprays into the nose daily.   oxyCODONE-acetaminophen (PERCOCET) 7.5-325 MG tablet Take 1 tablet by mouth every 6 (six) hours as needed for severe pain.   phentermine 37.5 MG capsule Take 1 capsule (37.5 mg total) by mouth every morning. DO NOT TAKE THIS MEDICATION WHEN TAKING ZYRTEC-D   promethazine (PHENERGAN) 25 MG tablet Take 1 tablet (25 mg total) by mouth every 8 (eight) hours as needed for nausea.   Semaglutide-Weight Management (WEGOVY) 1 MG/0.5ML SOAJ Inject 1 mg into the skin once a week.   Spacer/Aero-Holding Chambers (OPTICHAMBER ADVANTAGE) MISC 1 each by Other route once. Always uses her when you're using a metered-dose inhaler. You've aromatase medicine as much, he won't have his much side effect, but you it twice as much medicine and your lungs.   Tiotropium Bromide Monohydrate (SPIRIVA RESPIMAT) 2.5 MCG/ACT AERS Inhale 2 puffs into the lungs daily.   triamcinolone cream (KENALOG) 0.5 % Apply 1 application. topically 3 (three) times daily.   ZYRTEC-D ALLERGY & CONGESTION 5-120 MG tablet TAKE 1 TABLET BY MOUTH DAILY. AS NEEDED FOR ALLERGIES CAN RAISE BLOOD PRESSURE   No facility-administered medications prior to  visit.    Review of Systems  Constitutional:  Negative for appetite change, chills, fatigue and fever.  Respiratory:  Negative for chest tightness and shortness of breath.   Cardiovascular:  Negative for chest pain and palpitations.  Gastrointestinal:  Negative for abdominal pain, nausea and vomiting.  Neurological:  Negative for dizziness and weakness.        Objective    BP 119/64 (BP Location: Right Arm, Patient Position: Sitting, Cuff Size: Large)   Pulse 94   Resp 18   Wt 265 lb (120.2 kg)   SpO2 99%   BMI 50.07 kg/m    Physical Exam   General: Appearance:    Severely obese female in no acute distress  HEENT:    Nasal turbinates congested and erythematous. Tender frontal and maxillary sinuses    Lungs:     Diffuse expiratory wheezes, no focal rales or rhonchi, respirations unlabored  Heart:    Normal heart rate. Normal rhythm. No murmurs, rubs, or gallops.    MS:   All extremities are intact.    Neurologic:   Awake, alert, oriented x 3. No apparent focal neurological defect.         Assessment & Plan     1. Lumbar radiculitis  2. Chronic midline low back pain without sciatica  3. DDD (degenerative disc disease), lumbar  Refill- oxyCODONE-acetaminophen (PERCOCET) 7.5-325 MG tablet; Take 1 tablet by mouth every 6 (six) hours as needed for severe pain.  Dispense: 30 tablet; Refill: 0  4. Allergic rhinitis refill ZYRTEC-D ALLERGY & CONGESTION 5-120 MG tablet; TAKE 1 TABLET BY MOUTH DAILY. AS NEEDED FOR ALLERGIES CAN RAISE BLOOD PRESSURE  Dispense: 30 tablet; Refill: 5 - mometasone (NASONEX) 50 MCG/ACT nasal spray; Place 2 sprays into the nose daily.  Dispense: 17 g; Refill: 3  5. Acute frontal sinusitis, recurrence not specified  - amoxicillin (AMOXIL) 500 MG capsule; Take 2 capsules (1,000 mg total) by mouth 2 (two) times daily for 10 days.  Dispense: 40 capsule; Refill: 0  6. Morbid obesity ([place]) She has had limited success with phentermine in the past and is interested in trying a new weight loss medications. Discussed importance of healthy diet, portion control and regular exercise. Discussed potential adverse effects of Semaglutide and she wishes to proceed with trial of medication.  - Semaglutide-Weight Management (WEGOVY) 0.25 MG/0.5ML SOAJ; Inject 0.'25mg'$  once a week for 2-3 weeks, then increase to 0.'5mg'$  once a week   Dispense: 2 mL; Refill: 0   Addressed extensive list of chronic and acute medical problems today requiring 50 minutes reviewing her medical record, counseling patient regarding her conditions and coordination of care.        The entirety of the information documented in the History of Present Illness, Review of Systems and Physical Exam were personally obtained by me. Portions of this information were initially documented by the CMA and reviewed by me for thoroughness and accuracy.     Lelon Huh, MD  [place] [phone] (phone) [phone] (fax)  [place]

## 2022-02-23 ENCOUNTER — Ambulatory Visit (INDEPENDENT_AMBULATORY_CARE_PROVIDER_SITE_OTHER): Payer: BC Managed Care – PPO

## 2022-02-23 DIAGNOSIS — R5382 Chronic fatigue, unspecified: Secondary | ICD-10-CM | POA: Diagnosis not present

## 2022-02-23 MED ORDER — CYANOCOBALAMIN 1000 MCG/ML IJ SOLN
1000.0000 ug | Freq: Once | INTRAMUSCULAR | Status: AC
  Administered 2022-02-23: 1000 ug via INTRAMUSCULAR

## 2022-02-25 ENCOUNTER — Ambulatory Visit: Payer: BC Managed Care – PPO | Admitting: Physician Assistant

## 2022-02-25 ENCOUNTER — Encounter: Payer: Self-pay | Admitting: Family Medicine

## 2022-02-25 ENCOUNTER — Ambulatory Visit: Payer: BC Managed Care – PPO | Admitting: Family Medicine

## 2022-02-25 ENCOUNTER — Ambulatory Visit: Payer: Self-pay

## 2022-02-25 VITALS — BP 125/68 | HR 89 | Temp 98.4°F | Resp 17 | Ht 60.0 in | Wt 266.0 lb

## 2022-02-25 DIAGNOSIS — J329 Chronic sinusitis, unspecified: Secondary | ICD-10-CM | POA: Diagnosis not present

## 2022-02-25 DIAGNOSIS — J4541 Moderate persistent asthma with (acute) exacerbation: Secondary | ICD-10-CM | POA: Diagnosis not present

## 2022-02-25 MED ORDER — SODIUM CHLORIDE 3 % IN NEBU: INHALATION_SOLUTION | RESPIRATORY_TRACT | 0 refills | Status: DC | PRN

## 2022-02-25 MED ORDER — METHYLPREDNISOLONE ACETATE 80 MG/ML IJ SUSP
80.0000 mg | Freq: Once | INTRAMUSCULAR | Status: AC
  Administered 2022-02-25: 80 mg via INTRAMUSCULAR

## 2022-02-25 MED ORDER — SODIUM CHLORIDE 3 % IN NEBU: INHALATION_SOLUTION | RESPIRATORY_TRACT | 0 refills | Status: AC

## 2022-02-25 MED ORDER — BENZONATATE 200 MG PO CAPS: 200.0000 mg | ORAL_CAPSULE | Freq: Three times a day (TID) | ORAL | 0 refills | Status: DC | PRN

## 2022-02-25 MED ORDER — SPIRIVA RESPIMAT 2.5 MCG/ACT IN AERS: 2.0000 | INHALATION_SPRAY | Freq: Every day | RESPIRATORY_TRACT | 1 refills | Status: DC

## 2022-02-25 MED ORDER — LEVALBUTEROL TARTRATE 45 MCG/ACT IN AERO: 2.0000 | INHALATION_SPRAY | Freq: Four times a day (QID) | RESPIRATORY_TRACT | 12 refills | Status: DC | PRN

## 2022-02-25 MED ORDER — LEVOFLOXACIN 750 MG PO TABS: 750.0000 mg | ORAL_TABLET | Freq: Every day | ORAL | 0 refills | Status: DC

## 2022-02-25 NOTE — Assessment & Plan Note (Signed)
Chronic, appears well Reports poor compliance with Rx  Noted some improvement with amox; however, it wasn't "stron enough" Recommend use of levaquin with OTC mucinex DM Encouraged referral with ENT

## 2022-02-25 NOTE — Progress Notes (Deleted)
I,[name],acting as a Education administrator for [name], PA-C.,have documented all relevant documentation on the behalf of [name], PA-C,as directed by  [name], PA-C while in the presence of [name], PA-C.   Established patient visit   Patient: Erin Sherman   DOB: 10-23-1965   56 y.o. Female  MRN: [id number] Visit Date: 02/25/2022  Today's healthcare provider: [name], PA-C   No chief complaint on file.  Subjective    Upper respiratory symptoms She complains of {uri sx's' brief:15453}.with {systemic_sx:15294}. Onset of symptoms was {onset initial:119223} and {progression:119226}.She {hydration history:15378}.  Past history is significant for {respiratory illness:412}. Patient is {smoker?:15292}  ---------------------------------------------------------------------------------------------------   Medications: Outpatient Medications Prior to Visit  Medication Sig   albuterol (VENTOLIN HFA) 108 (90 Base) MCG/ACT inhaler TAKE 2 PUFFS BY MOUTH EVERY 6 HOURS AS NEEDED   aspirin 81 MG EC tablet Take 1 tablet (81 mg total) by mouth daily. Swallow whole.   azelastine (ASTELIN) 0.1 % nasal spray Place 2 sprays into both nostrils 2 (two) times daily. Use in each nostril as directed   budesonide-formoterol (SYMBICORT) 160-4.5 MCG/ACT inhaler Inhale 2 puffs into the lungs 2 (two) times daily.   calcium carbonate (TUMS - DOSED IN MG ELEMENTAL CALCIUM) 500 MG chewable tablet Chew 1 tablet by mouth daily as needed for indigestion.   cholecalciferol (VITAMIN D) 1000 UNITS tablet Take 1,000 Units by mouth daily.   EPINEPHRINE 0.3 mg/0.3 mL IJ SOAJ injection INJECT 0.3 MLS (0.3 MG TOTAL) INTO THE MUSCLE AS NEEDED. WITH ALLERGIC REACTION   ipratropium-albuterol (DUONEB) 0.5-2.5 (3) MG/3ML SOLN Inhale 3 mLs into the lungs every 6 (six) hours as needed. Reported on 12/28/2015   levalbuterol (XOPENEX HFA) 45 MCG/ACT inhaler Inhale 2 puffs into the lungs every 6 (six) hours as  needed for wheezing. May substitute generic   meloxicam (MOBIC) 15 MG tablet Take 1 tablet (15 mg total) by mouth daily.   mometasone (NASONEX) 50 MCG/ACT nasal spray Place 2 sprays into the nose daily.   oxyCODONE-acetaminophen (PERCOCET) 7.5-325 MG tablet Take 1 tablet by mouth every 6 (six) hours as needed for severe pain.   phentermine 37.5 MG capsule Take 1 capsule (37.5 mg total) by mouth every morning. DO NOT TAKE THIS MEDICATION WHEN TAKING ZYRTEC-D   promethazine (PHENERGAN) 25 MG tablet Take 1 tablet (25 mg total) by mouth every 8 (eight) hours as needed for nausea.   Semaglutide-Weight Management (WEGOVY) 0.25 MG/0.5ML SOAJ Inject 0.'25mg'$  once a week for 2-3 weeks, then increase to 0.'5mg'$  once a week   Spacer/Aero-Holding Chambers (OPTICHAMBER ADVANTAGE) MISC 1 each by Other route once. Always uses her when you're using a metered-dose inhaler. You've aromatase medicine as much, he won't have his much side effect, but you it twice as much medicine and your lungs.   Tiotropium Bromide Monohydrate (SPIRIVA RESPIMAT) 2.5 MCG/ACT AERS Inhale 2 puffs into the lungs daily.   triamcinolone cream (KENALOG) 0.5 % Apply 1 application. topically 3 (three) times daily.   ZYRTEC-D ALLERGY & CONGESTION 5-120 MG tablet TAKE 1 TABLET BY MOUTH DAILY. AS NEEDED FOR ALLERGIES CAN RAISE BLOOD PRESSURE   No facility-administered medications prior to visit.    Review of Systems  {Labs  Heme  Chem  Endocrine  Serology  Results Review (optional):23779}   Objective    There were no vitals taken for this visit. {Show previous vital signs (optional):23777}  Physical Exam  ***  No results found for any visits on 02/25/22.  Assessment &  Plan     ***  No follow-ups on file.      {provider attestation***:1}   [name], Hershal Coria  [facility] [phone] (phone) [phone] (fax)  [place]

## 2022-02-25 NOTE — Progress Notes (Signed)
Established patient visit   Patient: Erin Sherman   DOB: Aug 23, 1965   56 y.o. Female  MRN: [id number] Visit Date: 02/25/2022  Today's healthcare provider: Gwyneth Sprout, FNP  Introduced to nurse practitioner role and practice setting.  All questions answered.  Discussed provider/patient relationship and expectations.  I,Tiffany J Bragg,acting as a scribe for Gwyneth Sprout, FNP.,have documented all relevant documentation on the behalf of Gwyneth Sprout, FNP,as directed by  Gwyneth Sprout, FNP while in the presence of Gwyneth Sprout, FNP.  Chief Complaint  Patient presents with   URI   Subjective    HPI HPI   Patient states she has had a sinus infection starting 2 weeks ago, has been on amoxicillin. Complains of wheezing, cough, SOB, congestion. Has not had a covid or flu test, declines getting them today.  Last edited by Smitty Knudsen, CMA on 02/25/2022  1:24 PM.      Upper respiratory symptoms She complains of congestion, nasal congestion, and non productive cough.with no fever, chills, night sweats or weight loss. Onset of symptoms was a few weeks ago and gradually worsening.She is drinking plenty of fluids.  Past history is significant for asthma and occasional episodes of bronchitis. Patient is non-smoker  ---------------------------------------------------------------------------------------------------   Medications: Outpatient Medications Prior to Visit  Medication Sig   albuterol (VENTOLIN HFA) 108 (90 Base) MCG/ACT inhaler TAKE 2 PUFFS BY MOUTH EVERY 6 HOURS AS NEEDED   aspirin 81 MG EC tablet Take 1 tablet (81 mg total) by mouth daily. Swallow whole.   azelastine (ASTELIN) 0.1 % nasal spray Place 2 sprays into both nostrils 2 (two) times daily. Use in each nostril as directed   budesonide-formoterol (SYMBICORT) 160-4.5 MCG/ACT inhaler Inhale 2 puffs into the lungs 2 (two) times daily.   calcium carbonate (TUMS - DOSED IN MG ELEMENTAL CALCIUM) 500 MG chewable tablet  Chew 1 tablet by mouth daily as needed for indigestion.   cholecalciferol (VITAMIN D) 1000 UNITS tablet Take 1,000 Units by mouth daily.   EPINEPHRINE 0.3 mg/0.3 mL IJ SOAJ injection INJECT 0.3 MLS (0.3 MG TOTAL) INTO THE MUSCLE AS NEEDED. WITH ALLERGIC REACTION   ipratropium-albuterol (DUONEB) 0.5-2.5 (3) MG/3ML SOLN Inhale 3 mLs into the lungs every 6 (six) hours as needed. Reported on 12/28/2015   meloxicam (MOBIC) 15 MG tablet Take 1 tablet (15 mg total) by mouth daily.   mometasone (NASONEX) 50 MCG/ACT nasal spray Place 2 sprays into the nose daily.   oxyCODONE-acetaminophen (PERCOCET) 7.5-325 MG tablet Take 1 tablet by mouth every 6 (six) hours as needed for severe pain.   phentermine 37.5 MG capsule Take 1 capsule (37.5 mg total) by mouth every morning. DO NOT TAKE THIS MEDICATION WHEN TAKING ZYRTEC-D   promethazine (PHENERGAN) 25 MG tablet Take 1 tablet (25 mg total) by mouth every 8 (eight) hours as needed for nausea.   Semaglutide-Weight Management (WEGOVY) 0.25 MG/0.5ML SOAJ Inject 0.'25mg'$  once a week for 2-3 weeks, then increase to 0.'5mg'$  once a week   Spacer/Aero-Holding Chambers (OPTICHAMBER ADVANTAGE) MISC 1 each by Other route once. Always uses her when you're using a metered-dose inhaler. You've aromatase medicine as much, he won't have his much side effect, but you it twice as much medicine and your lungs.   triamcinolone cream (KENALOG) 0.5 % Apply 1 application. topically 3 (three) times daily.   ZYRTEC-D ALLERGY & CONGESTION 5-120 MG tablet TAKE 1 TABLET BY MOUTH DAILY. AS NEEDED FOR ALLERGIES CAN RAISE BLOOD PRESSURE   [  DISCONTINUED] levalbuterol (XOPENEX HFA) 45 MCG/ACT inhaler Inhale 2 puffs into the lungs every 6 (six) hours as needed for wheezing. May substitute generic   [DISCONTINUED] Tiotropium Bromide Monohydrate (SPIRIVA RESPIMAT) 2.5 MCG/ACT AERS Inhale 2 puffs into the lungs daily.   No facility-administered medications prior to visit.    Review of Systems     Objective    BP 125/68 (BP Location: Right Arm, Patient Position: Sitting, Cuff Size: Large)   Pulse 89   Temp 98.4 F (36.9 C) (Oral)   Resp 17   Ht 5' (1.524 m)   Wt 266 lb (120.7 kg)   SpO2 98%   BMI 51.95 kg/m   Physical Exam Vitals and nursing note reviewed.  Constitutional:      General: She is not in acute distress.    Appearance: Normal appearance. She is obese. She is not ill-appearing, toxic-appearing or diaphoretic.  HENT:     Head: Normocephalic and atraumatic.     Right Ear: Tympanic membrane, ear canal and external ear normal.     Left Ear: Tympanic membrane, ear canal and external ear normal.     Nose:     Right Sinus: Maxillary sinus tenderness and frontal sinus tenderness present.     Left Sinus: Maxillary sinus tenderness and frontal sinus tenderness present.     Mouth/Throat:     Mouth: Mucous membranes are moist.     Pharynx: Oropharynx is clear. No oropharyngeal exudate or posterior oropharyngeal erythema.  Eyes:     Extraocular Movements: Extraocular movements intact.     Conjunctiva/sclera: Conjunctivae normal.     Pupils: Pupils are equal, round, and reactive to light.  Cardiovascular:     Rate and Rhythm: Normal rate and regular rhythm.     Pulses: Normal pulses.     Heart sounds: Normal heart sounds. No murmur heard.    No friction rub. No gallop.  Pulmonary:     Effort: Pulmonary effort is normal. No respiratory distress.     Breath sounds: Normal breath sounds. No stridor. No wheezing, rhonchi or rales.  Chest:     Chest wall: Tenderness present.     Comments: Reports atypical CP from coughing  Abdominal:     General: Bowel sounds are normal.     Palpations: Abdomen is soft.  Musculoskeletal:        General: No swelling, deformity or signs of injury. Normal range of motion.     Cervical back: Normal range of motion and neck supple. Tenderness present.     Right lower leg: No edema.     Left lower leg: No edema.  Skin:    General: Skin is  warm and dry.     Capillary Refill: Capillary refill takes less than 2 seconds.     Coloration: Skin is not jaundiced or pale.     Findings: No bruising, erythema, lesion or rash.  Neurological:     General: No focal deficit present.     Mental Status: She is alert and oriented to person, place, and time. Mental status is at baseline.     Cranial Nerves: No cranial nerve deficit.     Sensory: No sensory deficit.     Motor: No weakness.     Coordination: Coordination normal.  Psychiatric:        Mood and Affect: Mood normal.        Behavior: Behavior normal.        Thought Content: Thought content normal.  Judgment: Judgment normal.    No results found for any visits on 02/25/22.  Assessment & Plan     Problem List Items Addressed This Visit       Respiratory   Asthma, chronic, moderate persistent, uncomplicated - Primary    Recent exacerbation Poor inspiratory effort d/t body habitus Denies wheezing Notes bronchial cough with atypical CP No rhonchi Denies tobacco Recommend use of Chest PT, manual, and refer back to pulm Could benefit from PFTs Recommend 3% hypertonics to assist       Relevant Medications   benzonatate (TESSALON) 200 MG capsule   Tiotropium Bromide Monohydrate (SPIRIVA RESPIMAT) 2.5 MCG/ACT AERS   levalbuterol (XOPENEX HFA) 45 MCG/ACT inhaler   sodium chloride HYPERTONIC 3 % nebulizer solution   Recurrent sinusitis    Chronic, appears well Reports poor compliance with Rx  Noted some improvement with amox; however, it wasn't "stron enough" Recommend use of levaquin with OTC mucinex DM Encouraged referral with ENT      Relevant Medications   levofloxacin (LEVAQUIN) 750 MG tablet   benzonatate (TESSALON) 200 MG capsule   sodium chloride HYPERTONIC 3 % nebulizer solution   Other Relevant Orders   Ambulatory referral to ENT   Return if symptoms worsen or fail to improve.      Vonna Kotyk, FNP, have reviewed all documentation for this  visit. The documentation on 02/25/22 for the exam, diagnosis, procedures, and orders are all accurate and complete.  Gwyneth Sprout, [place] [phone] (phone) [phone] (fax)  [place]

## 2022-02-25 NOTE — Assessment & Plan Note (Signed)
Recent exacerbation Poor inspiratory effort d/t body habitus Denies wheezing Notes bronchial cough with atypical CP No rhonchi Denies tobacco Recommend use of Chest PT, manual, and refer back to pulm Could benefit from PFTs Recommend 3% hypertonics to assist

## 2022-02-25 NOTE — Telephone Encounter (Signed)
  Chief Complaint: Cough, mild SOB Symptoms: Cough mild SOB, fatigue, sinus congestion Frequency: Before August 8th - ongoing Pertinent Negatives: Patient denies Fever Disposition: '[]'$ ED /'[]'$ Urgent Care (no appt availability in office) / '[x]'$ Appointment(In office/virtual)/ '[]'$  [facility] [facility]/ '[]'$ Home Care/ '[]'$ Refused Recommended Disposition /'[]'$ [place] [facility]/ '[]'$  Follow-up with PCP Additional Notes: PT states that this has been ongoing since the weekend of August 6th.  Pt sates that her chest hurts from coughing so much. She also states that she has sinus congestion. Hx of asthma.    Reason for Disposition  [1] MILD difficulty breathing (e.g., minimal/no SOB at rest, SOB with walking, pulse <100) AND [2] NEW-onset or WORSE than normal  Answer Assessment - Initial Assessment Questions 1. RESPIRATORY STATUS: "Describe your breathing?" (e.g., wheezing, shortness of breath, unable to speak, severe coughing)      SOB 2. ONSET: "When did this breathing problem begin?"      Weekend of the 6th 3. PATTERN "Does the difficult breathing come and go, or has it been constant since it started?"      Constant 4. SEVERITY: "How bad is your breathing?" (e.g., mild, moderate, severe)    - MILD: No SOB at rest, mild SOB with walking, speaks normally in sentences, can lie down, no retractions, pulse < 100.    - MODERATE: SOB at rest, SOB with minimal exertion and prefers to sit, cannot lie down flat, speaks in phrases, mild retractions, audible wheezing, pulse 100-120.    - SEVERE: Very SOB at rest, speaks in single words, struggling to breathe, sitting hunched forward, retractions, pulse > 120      moderate 5. RECURRENT SYMPTOM: "Have you had difficulty breathing before?" If Yes, ask: "When was the last time?" and "What happened that time?"      yes 6. CARDIAC HISTORY: "Do you have any history of heart disease?" (e.g., heart attack, angina, bypass surgery, angioplasty)      no 7. LUNG HISTORY: "Do  you have any history of lung disease?"  (e.g., pulmonary embolus, asthma, emphysema)     asthma 8. CAUSE: "What do you think is causing the breathing problem?"      Asthma - upper respiratory infection 9. OTHER SYMPTOMS: "Do you have any other symptoms? (e.g., dizziness, runny nose, cough, chest pain, fever)     Sinus congestion, respiratory infection 10. O2 SATURATION MONITOR:  "Do you use an oxygen saturation monitor (pulse oximeter) at home?" If Yes, ask: "What is your reading (oxygen level) today?" "What is your usual oxygen saturation reading?" (e.g., 95%)        11. PREGNANCY: "Is there any chance you are pregnant?" "When was your last menstrual period?"        12. TRAVEL: "Have you traveled out of the country in the last month?" (e.g., travel history, exposures)  Protocols used: Breathing Difficulty-A-AH

## 2022-02-28 ENCOUNTER — Telehealth: Payer: Self-pay | Admitting: Family Medicine

## 2022-02-28 NOTE — Telephone Encounter (Signed)
I checked with Elmyra Ricks in our medical records. Per Elmyra Ricks, forms were faxed today. I returned patients call and gave her this update.

## 2022-02-28 NOTE — Telephone Encounter (Signed)
Copied from [place] [id number]. Topic: General - Other >> Feb 28, 2022  2:34 PM Everette C wrote: Reason for CRM: The patient has called to stress the urgency of their request for completion of their resubmitted FMLA paperwork   The patient would like to be contacted when the paperwork has been corrected and resubmitted  Please contact further when possible

## 2022-03-23 ENCOUNTER — Ambulatory Visit (INDEPENDENT_AMBULATORY_CARE_PROVIDER_SITE_OTHER): Payer: BC Managed Care – PPO | Admitting: Family Medicine

## 2022-03-23 DIAGNOSIS — R5382 Chronic fatigue, unspecified: Secondary | ICD-10-CM

## 2022-03-23 MED ORDER — CYANOCOBALAMIN 1000 MCG/ML IJ SOLN
1000.0000 ug | Freq: Once | INTRAMUSCULAR | Status: AC
  Administered 2022-03-23: 1000 ug via INTRAMUSCULAR

## 2022-03-23 NOTE — Progress Notes (Signed)
B12 shot only. °

## 2022-03-28 ENCOUNTER — Other Ambulatory Visit: Payer: Self-pay | Admitting: Family Medicine

## 2022-03-28 NOTE — Telephone Encounter (Signed)
Pt called to report that she has yet to receive her wegovy injection order, curious if PCP will prescribe an alternative.  Medication Refill - Medication: "diet pills, I dont know the name but I know they're green and black pills. Im at work I dont have them on me"  Has the patient contacted their pharmacy? Yes.   (Agent: If no, request that the patient contact the pharmacy for the refill. If patient does not wish to contact the pharmacy document the reason why and proceed with request.) (Agent: If yes, when and what did the pharmacy advise?)  Preferred Pharmacy (with phone number or street name):  [id number] [address] [address]  [address] Phone: [phone]: [phone]  Has the patient been seen for an appointment in the last year OR does the patient have an upcoming appointment? Yes.    Agent: Please be advised that RX refills may take up to 3 business days. We ask that you follow-up with your pharmacy.

## 2022-03-29 MED ORDER — PHENTERMINE HCL 37.5 MG PO CAPS: 37.5000 mg | ORAL_CAPSULE | ORAL | 3 refills | Status: DC

## 2022-03-29 MED ORDER — WEGOVY 0.25 MG/0.5ML ~~LOC~~ SOAJ: 0.5000 mg | SUBCUTANEOUS | 1 refills | Status: DC

## 2022-03-29 MED ORDER — WEGOVY 0.25 MG/0.5ML ~~LOC~~ SOAJ
0.5000 mg | SUBCUTANEOUS | 1 refills | Status: DC
Start: 2022-03-29 — End: 2022-04-25

## 2022-03-29 NOTE — Telephone Encounter (Signed)
Patient wanted PCP to know [facility] not [facility] Semaglutide-Weight Management (WEGOVY) 0.25 MG/0.5ML SOAJ in stock and would like PCP to send in prescription to [facility] [address], [place], [place] [address] [phone]. Patient states they have the 2.4 and 1 in stock and would like request expedited before the pharmacy runs out.   [facility] [address] [place], [place], [place] [address] [phone]

## 2022-03-29 NOTE — Telephone Encounter (Signed)
Requested medication (s) are due for refill today: yes  Requested medication (s) are on the active medication list: yes  Last refill:  [facility] 02/08/22 2 ml with 0 RF ( pt states she has never received due to shortage, asking if there is an alternative) Phentermine 11/05/21 #30 with 3 RF  Future visit scheduled: no, just seen 03/23/22  Notes to clinic:  Failed protocol of labs within 12 months, labs from 01/17/2021, also Phentermine is not delegated, no upcoming appt, please assess.       Requested Prescriptions  Pending Prescriptions Disp Refills   Semaglutide-Weight Management (WEGOVY) 0.25 MG/0.5ML SOAJ 2 mL 0    Sig: Inject 0.18m once a week for 2-3 weeks, then increase to 0.531monce a week     Endocrinology:  Diabetes - GLP-1 Receptor Agonists - semaglutide Failed - 03/28/2022 10:22 AM      Failed - HBA1C in normal range and within 180 days    No results found for: "HGBA1C", "LABA1C"       Failed - Cr in normal range and within 360 days    Creatinine  Date Value Ref Range Status  09/30/2014 0.70 mg/dL Final    Comment:    0.44-1.00 NOTE: New Reference Range  09/09/14    Creatinine, Ser  Date Value Ref Range Status  01/17/2021 0.67 0.44 - 1.00 mg/dL Final         Passed - Valid encounter within last 6 months    Recent Outpatient Visits           6 days ago [facility]   1 month ago Moderate persistent chronic asthma with acute exacerbation   [facility]   1 month ago Acute frontal sinusitis, recurrence not specified   [facility]   2 months ago Otitis of right ear   [facility]   4 months ago [facility] fatigue   [facility]               phentermine 37.5 MG capsule 30 capsule 3    Sig: Take 1 capsule (37.5 mg total) by mouth every morning. DO NOT TAKE THIS MEDICATION  WHEN TAKING ZYRTEC-D     Not Delegated - Neurology: Anticonvulsants - Controlled - phentermine hydrochloride Failed - 03/28/2022 10:22 AM      Failed - This refill cannot be delegated      Failed - eGFR in normal range and within 360 days    EGFR (African American)  Date Value Ref Range Status  09/30/2014 >60  Final   GFR calc Af Amer  Date Value Ref Range Status  02/28/2020 119 >59 mL/min/1.73 Final    Comment:    **[facility] currently reports eGFR in compliance with the current**   recommendations of the [employer] will   update reporting as new guidelines are published from the NKF-ASN   Task force.    EGFR (Non-African Amer.)  Date Value Ref Range Status  09/30/2014 >60  Final    Comment:    eGFR values <6051min/1.73 m2 may be an indication of chronic kidney disease (CKD). Calculated eGFR is useful in patients with stable renal function. The eGFR calculation will not be reliable in acutely ill patients when serum creatinine is changing rapidly. It is not useful in patients on dialysis. The eGFR calculation may not  be applicable to patients at the low and high extremes of body sizes, pregnant women, and vegetarians.    GFR, Estimated  Date Value Ref Range Status  01/17/2021 >60 >60 mL/min Final    Comment:    (NOTE) Calculated using the CKD-EPI Creatinine Equation (2021)          Failed - Cr in normal range and within 360 days    Creatinine  Date Value Ref Range Status  09/30/2014 0.70 mg/dL Final    Comment:    0.44-1.00 NOTE: New Reference Range  09/09/14    Creatinine, Ser  Date Value Ref Range Status  01/17/2021 0.67 0.44 - 1.00 mg/dL Final         Passed - Last BP in normal range    BP Readings from Last 1 Encounters:  02/25/22 125/68         Passed - Valid encounter within last 6 months    Recent Outpatient Visits           6 days ago Chronic fatigue   [facility] Birdie Sons, MD   1 month ago  Moderate persistent chronic asthma with acute exacerbation   [facility] Tally Joe T, FNP   1 month ago Acute frontal sinusitis, recurrence not specified   [facility] Birdie Sons, MD   2 months ago Otitis of right ear   [facility] Birdie Sons, MD   4 months ago Chronic fatigue   [place], [place]              Passed - Weight completed in the last 3 months    Wt Readings from Last 1 Encounters:  02/25/22 266 lb (120.7 kg)

## 2022-04-20 ENCOUNTER — Ambulatory Visit (INDEPENDENT_AMBULATORY_CARE_PROVIDER_SITE_OTHER): Payer: BC Managed Care – PPO

## 2022-04-20 DIAGNOSIS — R5382 Chronic fatigue, unspecified: Secondary | ICD-10-CM

## 2022-04-20 MED ORDER — CYANOCOBALAMIN 1000 MCG/ML IJ SOLN
1000.0000 ug | Freq: Once | INTRAMUSCULAR | Status: AC
  Administered 2022-04-20: 1000 ug via INTRAMUSCULAR

## 2022-04-25 ENCOUNTER — Encounter: Payer: Self-pay | Admitting: Family Medicine

## 2022-04-25 ENCOUNTER — Ambulatory Visit (INDEPENDENT_AMBULATORY_CARE_PROVIDER_SITE_OTHER): Payer: BC Managed Care – PPO | Admitting: Family Medicine

## 2022-04-25 DIAGNOSIS — M5136 Other intervertebral disc degeneration, lumbar region: Secondary | ICD-10-CM | POA: Diagnosis not present

## 2022-04-25 DIAGNOSIS — G8929 Other chronic pain: Secondary | ICD-10-CM

## 2022-04-25 DIAGNOSIS — J301 Allergic rhinitis due to pollen: Secondary | ICD-10-CM | POA: Diagnosis not present

## 2022-04-25 DIAGNOSIS — J45909 Unspecified asthma, uncomplicated: Secondary | ICD-10-CM

## 2022-04-25 DIAGNOSIS — M545 Low back pain, unspecified: Secondary | ICD-10-CM

## 2022-04-25 DIAGNOSIS — M5416 Radiculopathy, lumbar region: Secondary | ICD-10-CM | POA: Diagnosis not present

## 2022-04-25 MED ORDER — WEGOVY 0.25 MG/0.5ML ~~LOC~~ SOAJ: 0.5000 mg | SUBCUTANEOUS | 0 refills | Status: DC

## 2022-04-25 MED ORDER — PHENTERMINE HCL 37.5 MG PO CAPS: 37.5000 mg | ORAL_CAPSULE | ORAL | 1 refills | Status: DC

## 2022-04-25 MED ORDER — OXYCODONE-ACETAMINOPHEN 7.5-325 MG PO TABS: 1.0000 | ORAL_TABLET | Freq: Four times a day (QID) | ORAL | 0 refills | Status: DC | PRN

## 2022-04-25 MED ORDER — IPRATROPIUM-ALBUTEROL 0.5-2.5 (3) MG/3ML IN SOLN: 3.0000 mL | Freq: Four times a day (QID) | RESPIRATORY_TRACT | 3 refills | Status: DC | PRN

## 2022-04-25 MED ORDER — PREDNISONE 10 MG PO TABS: ORAL_TABLET | ORAL | 0 refills | Status: DC

## 2022-04-25 MED ORDER — MONTELUKAST SODIUM 10 MG PO TABS: 10.0000 mg | ORAL_TABLET | Freq: Every day | ORAL | 3 refills | Status: DC

## 2022-04-25 NOTE — Patient Instructions (Addendum)
Please review the attached list of medications and notify my office if there are any errors.   Call [facility] at [facility] at [phone] to schedule a follow up appointment wiht Dr. Mortimer Fries

## 2022-04-25 NOTE — Progress Notes (Signed)
I,Roshena L Chambers,acting as a scribe for Lelon Huh, MD.,have documented all relevant documentation on the behalf of Lelon Huh, MD,as directed by  Lelon Huh, MD while in the presence of Lelon Huh, MD.    Established patient visit   Patient: Erin Sherman   DOB: 1966/02/08   56 y.o. Female  MRN: [id number] Visit Date: 04/25/2022  Today's healthcare provider: Lelon Huh, MD   Chief Complaint  Patient presents with   Asthma   Subjective    HPI  Follow up for Asthma:  The patient was last seen for this on 02/25/2022 (seen by Tally Joe, FNP).   During that visit patient was prescribed levofloxacin and benzonatate and advised to follow up with pulmonology. Was last seen by Dr. Mortimer Fries in February  She reports good compliance with treatment. She feels that condition is Worse.  She reports having cough, tightness in chest, pain with deep breathing and worsened shortness of breath for the past 2 days. She has tried using albuterol inhalers, Spiriva and nebulizer treatment with no improvement  -----------------------------------------------------------------------------------------  Is also here to follow up on obesity medications. Had done well on 0.25/0.'5mg'$  semaglutide. Had Rx for additional 0.'5mg'$  pens, but apparently pharmacy did not have it stock so has not taken any for the last month. Has also been out of phentermine which had been partially effective in the past.  Medications: Outpatient Medications Prior to Visit  Medication Sig   albuterol (VENTOLIN HFA) 108 (90 Base) MCG/ACT inhaler TAKE 2 PUFFS BY MOUTH EVERY 6 HOURS AS NEEDED   aspirin 81 MG EC tablet Take 1 tablet (81 mg total) by mouth daily. Swallow whole.   azelastine (ASTELIN) 0.1 % nasal spray Place 2 sprays into both nostrils 2 (two) times daily. Use in each nostril as directed   benzonatate (TESSALON) 200 MG capsule Take 1 capsule (200 mg total) by mouth 3 (three) times daily as needed for  cough.   budesonide-formoterol (SYMBICORT) 160-4.5 MCG/ACT inhaler Inhale 2 puffs into the lungs 2 (two) times daily.   calcium carbonate (TUMS - DOSED IN MG ELEMENTAL CALCIUM) 500 MG chewable tablet Chew 1 tablet by mouth daily as needed for indigestion.   cholecalciferol (VITAMIN D) 1000 UNITS tablet Take 1,000 Units by mouth daily.   EPINEPHRINE 0.3 mg/0.3 mL IJ SOAJ injection INJECT 0.3 MLS (0.3 MG TOTAL) INTO THE MUSCLE AS NEEDED. WITH ALLERGIC REACTION   ipratropium-albuterol (DUONEB) 0.5-2.5 (3) MG/3ML SOLN Inhale 3 mLs into the lungs every 6 (six) hours as needed. Reported on 12/28/2015   levalbuterol (XOPENEX HFA) 45 MCG/ACT inhaler Inhale 2 puffs into the lungs every 6 (six) hours as needed for wheezing. May substitute generic   meloxicam (MOBIC) 15 MG tablet Take 1 tablet (15 mg total) by mouth daily.   mometasone (NASONEX) 50 MCG/ACT nasal spray Place 2 sprays into the nose daily.   oxyCODONE-acetaminophen (PERCOCET) 7.5-325 MG tablet Take 1 tablet by mouth every 6 (six) hours as needed for severe pain.   promethazine (PHENERGAN) 25 MG tablet Take 1 tablet (25 mg total) by mouth every 8 (eight) hours as needed for nausea.   Semaglutide-Weight Management (WEGOVY) 0.25 MG/0.5ML SOAJ Inject 0.5 mg into the skin once a week. Inject 0.'25mg'$  once a week for 2-3 weeks, then increase to 0.'5mg'$  once a week   sodium chloride HYPERTONIC 3 % nebulizer solution Use one- 15 mL ampule, up to 4 times daily, as needed, via nebulizer for cough/chest tightness.   Spacer/Aero-Holding [facility]  ADVANTAGE) MISC 1 each by Other route once. Always uses her when you're using a metered-dose inhaler. You've aromatase medicine as much, he won't have his much side effect, but you it twice as much medicine and your lungs.   Tiotropium Bromide Monohydrate (SPIRIVA RESPIMAT) 2.5 MCG/ACT AERS Inhale 2 puffs into the lungs daily.   triamcinolone cream (KENALOG) 0.5 % Apply 1 application. topically 3 (three) times  daily.   ZYRTEC-D ALLERGY & CONGESTION 5-120 MG tablet TAKE 1 TABLET BY MOUTH DAILY. AS NEEDED FOR ALLERGIES CAN RAISE BLOOD PRESSURE   phentermine 37.5 MG capsule Take 1 capsule (37.5 mg total) by mouth every morning. DO NOT TAKE THIS MEDICATION WHEN TAKING ZYRTEC-D (Patient not taking: Reported on 04/25/2022)   [DISCONTINUED] levofloxacin (LEVAQUIN) 750 MG tablet Take 1 tablet (750 mg total) by mouth daily. (Patient not taking: Reported on 04/25/2022)   No facility-administered medications prior to visit.    Review of Systems  Constitutional:  Negative for appetite change, chills, fatigue and fever.  HENT:  Positive for congestion, sinus pressure, sneezing and sore throat.   Respiratory:  Positive for cough (dry), shortness of breath and wheezing. Negative for chest tightness.   Cardiovascular:  Negative for chest pain and palpitations.  Gastrointestinal:  Negative for abdominal pain, nausea and vomiting.  Neurological:  Negative for dizziness and weakness.       Objective    BP 130/72 (BP Location: Right Arm, Patient Position: Sitting, Cuff Size: Large)   Pulse 94   Temp 98.1 F (36.7 C) (Oral)   Resp 18   Wt 274 lb 14.4 oz (124.7 kg)   SpO2 99% Comment: room air  BMI 53.69 kg/m    Physical Exam   General: Appearance:    Severely obese female in no acute distress  Eyes:    PERRL, conjunctiva/corneas clear, EOM's intact       Lungs:     No wheezes, rales, or rhonchi, but diminished breath sounds.  respirations unlabored  Heart:    Normal heart rate. Normal rhythm. No murmurs, rubs, or gallops.    MS:   All extremities are intact.    Neurologic:   Awake, alert, oriented x 3. No apparent focal neurological defect.         Assessment & Plan     1. Morbid obesity ([place]) Has been out of semaglutide for the last month due to lack of pharmacy availability. Will restart at 0.25/0.'5mg'$  dose then titrate up as directed.   - Semaglutide-Weight Management (WEGOVY) 0.25 MG/0.5ML  SOAJ; Inject 0.5 mg into the skin once a week. Inject 0.'25mg'$  once a week for 2-3 weeks, then increase to 0.'5mg'$  once a week  Dispense: 2 mL; Refill: 0  restart phentermine 37.5 MG capsule; Take 1 capsule (37.5 mg total) by mouth every morning. DO NOT TAKE THIS MEDICATION WHEN TAKING ZYRTEC-D  Dispense: 30 capsule; Refill: 1  2. Chronic asthma without complication, unspecified asthma severity, unspecified whether persistent Continue Symbicort and Spiriva. Start 6 day prednisone taper. Advised to call Dr. Zoila Shutter office ASAP  3. Allergic rhinitis due to pollen, unspecified seasonality Add montelukast  4. Lumbar radiculitis  5. Chronic midline low back pain without sciatica  6. DDD (degenerative disc disease), lumbar Refill oxyCODONE-acetaminophen (PERCOCET) 7.5-325 MG tablet; Take 1 tablet by mouth every 6 (six) hours as needed for severe pain.  Dispense: 30 tablet; Refill: 0       The entirety of the information documented in the History of Present Illness, Review of  Systems and Physical Exam were personally obtained by me. Portions of this information were initially documented by the CMA and reviewed by me for thoroughness and accuracy.     Lelon Huh, MD  [facility] [phone] (phone) [phone] (fax)  [place]

## 2022-04-27 ENCOUNTER — Ambulatory Visit: Payer: BC Managed Care – PPO

## 2022-05-04 ENCOUNTER — Telehealth: Payer: Self-pay

## 2022-05-04 NOTE — Telephone Encounter (Signed)
PA needs to be resubmitted.

## 2022-05-05 NOTE — Telephone Encounter (Signed)
Done on CoverMy Meds

## 2022-05-11 ENCOUNTER — Ambulatory Visit: Payer: BC Managed Care – PPO | Admitting: Physician Assistant

## 2022-05-23 NOTE — Telephone Encounter (Signed)
Called patient to let her know the PA was done 2 weeks ago.

## 2022-05-23 NOTE — Telephone Encounter (Signed)
Pt called for update on request, wants to speak to the clinic today. Says she has been waiting and that she lost 30 pounds on the medication.   Best contact: [phone].

## 2022-06-01 ENCOUNTER — Ambulatory Visit (INDEPENDENT_AMBULATORY_CARE_PROVIDER_SITE_OTHER): Payer: BC Managed Care – PPO | Admitting: Physician Assistant

## 2022-06-01 DIAGNOSIS — R5382 Chronic fatigue, unspecified: Secondary | ICD-10-CM

## 2022-06-01 MED ORDER — CYANOCOBALAMIN 1000 MCG/ML IJ SOLN
1000.0000 ug | Freq: Once | INTRAMUSCULAR | Status: AC
  Administered 2022-06-01: 1000 ug via INTRAMUSCULAR

## 2022-06-01 NOTE — Progress Notes (Signed)
Patient is here for B12 shot.

## 2022-06-02 ENCOUNTER — Ambulatory Visit: Payer: Self-pay

## 2022-06-02 ENCOUNTER — Other Ambulatory Visit: Payer: Self-pay

## 2022-06-02 DIAGNOSIS — J4540 Moderate persistent asthma, uncomplicated: Secondary | ICD-10-CM

## 2022-06-02 NOTE — Telephone Encounter (Signed)
  Chief Complaint: Asthma Symptoms: Head congestion mildly SOB Frequency: 2 days Pertinent Negatives: Patient denies Fever Disposition: '[]'$ ED /'[x]'$ Urgent Care (no appt availability in office) / '[]'$ Appointment(In office/virtual)/ '[]'$  [place] [facility]/ '[]'$ Home Care/ '[]'$ Refused Recommended Disposition /'[]'$ [place] [facility]/ '[]'$  Follow-up with PCP Additional Notes: PT has had asthma exacerbation d/t dog in home and weather changes. PT does not want to see any other provider than Dr. Caryn Section. PT is out of Albuterol.    Reason for Disposition  [1] MILD asthma attack (e.g., no SOB at rest, mild SOB with walking, speaks normally in sentences, mild wheezing) AND [2] lasting > 24 hours on prescribed treatment  Answer Assessment - Initial Assessment Questions 1. RESPIRATORY STATUS: "Describe your breathing?" (e.g., wheezing, shortness of breath, unable to speak, severe coughing)      SOB 2. ONSET: "When did this asthma attack begin?"      yesterday 3. TRIGGER: "What do you think triggered this attack?" (e.g., URI, exposure to pollen or other allergen, tobacco smoke)      Dog fur 4. PEAK EXPIRATORY FLOW RATE (PEFR): "Do you use a peak flow meter?" If Yes, ask: "What's the current peak flow? What's your personal best peak flow?"      no 5. SEVERITY: "How bad is this attack?"    - MILD: No SOB at rest, mild SOB with walking, speaks normally in sentences, can lie down, no retractions, pulse < 100. (GREEN Zone: PEFR 80-100%)   - MODERATE: SOB at rest, SOB with minimal exertion and prefers to sit, cannot lie down flat, speaks in phrases, mild retractions, audible wheezing, pulse 100-120. (YELLOW Zone: PEFR 50-79%)    - SEVERE: Struggling for each breath, speaks in single words, struggling to breathe, sitting hunched forward, retractions, usually loud wheezing, sometimes minimal wheezing because of decreased air movement, pulse > 120. (RED Zone: PEFR < 50%).      Mild - moderate 6. ASTHMA MEDICINES:   "What treatments have you tried?"    - INHALED QUICK RELIEF (RESCUE): "What is your inhaled quick-relief medicine?" (e.g., albuterol, salbutamol) "Do you use an inhaler or a nebulizer?" "How frequently have you been using this medicine?"   - CONTROLLER (LONG-TERM-CONTROL): "Do you take an inhaled steroid? (e.g., Asmanex, Flovent, Pulmicort, Qvar)     Inhaler, out of albuterol, using symbicort 7. INHALED QUICK-RELIEF TREATMENTS FOR THIS ATTACK: "What treatments have you given yourself so far?" and "How many and how often?" If using an inhaler, ask, "How many puffs?" Note: Routine treatments are 2 puffs every 4 hours as needed. Rescue treatments are 4 puffs repeated every 20 minutes, up to three times as needed.      no 8. OTHER SYMPTOMS: "Do you have any other symptoms? (e.g., chest pain, coughing up yellow sputum, fever, runny nose)     Coughing, head congestion 9. O2 SATURATION MONITOR:  "Do you use an oxygen saturation monitor (pulse oximeter) at home?" If Yes, "What is your reading (oxygen level) today?" "What is your usual oxygen saturation reading?" (e.g., 95%)     no 10. PREGNANCY: "Is there any chance you are pregnant?" "When was your last menstrual period?"  Protocols used: Asthma Attack-A-AH

## 2022-06-03 ENCOUNTER — Telehealth: Payer: Self-pay

## 2022-06-03 ENCOUNTER — Ambulatory Visit: Payer: Self-pay

## 2022-06-03 DIAGNOSIS — J4540 Moderate persistent asthma, uncomplicated: Secondary | ICD-10-CM

## 2022-06-03 MED ORDER — ALBUTEROL SULFATE HFA 108 (90 BASE) MCG/ACT IN AERS: INHALATION_SPRAY | RESPIRATORY_TRACT | 5 refills | Status: DC

## 2022-06-03 MED ORDER — PREDNISONE 10 MG PO TABS: ORAL_TABLET | ORAL | 0 refills | Status: AC

## 2022-06-03 NOTE — Telephone Encounter (Signed)
Requested Prescriptions  Pending Prescriptions Disp Refills   albuterol (VENTOLIN HFA) 108 (90 Base) MCG/ACT inhaler 6.7 g 5    Sig: TAKE 2 PUFFS BY MOUTH EVERY 6 HOURS AS NEEDED     Pulmonology:  [facility] 2 Passed - 06/02/2022  4:12 PM      Passed - Last BP in normal range    BP Readings from Last 1 Encounters:  04/25/22 130/72         Passed - Last Heart Rate in normal range    Pulse Readings from Last 1 Encounters:  04/25/22 94         Passed - Valid encounter within last 12 months    Recent Outpatient Visits           1 month ago Morbid obesity [facility])   [facility] Birdie Sons, MD   2 months ago Chronic fatigue   [facility] Birdie Sons, MD   3 months ago Moderate persistent chronic asthma with acute exacerbation   [facility] Tally Joe T, FNP   3 months ago Acute frontal sinusitis, recurrence not specified   [facility] Birdie Sons, MD   4 months ago Otitis of right ear   [facility] Birdie Sons, MD       Future Appointments             In 1 week Fisher, Kirstie Peri, MD [facility] [place], [place]

## 2022-06-03 NOTE — Telephone Encounter (Signed)
  Chief Complaint: Allergy and asthma s/s Symptoms: above Frequency: Past few days. Pertinent Negatives: Patient denies  Disposition: '[]'$ ED /'[x]'$ Urgent Care (no appt availability in office) / '[]'$ Appointment(In office/virtual)/ '[]'$  [place] [facility]/ '[]'$ Home Care/ '[x]'$ Refused Recommended Disposition /'[]'$ [place] [facility]/ '[]'$  Follow-up with PCP Additional Notes: Pt would like an Rx for prednisone and an antibiotic called into pharmacy. S/w pt yesterday symptoms remain unchanged. PT did not go to UC yesterday and refuses UC today. PT understands that new medications require and OV. PT states that Dr. Caryn Section has called in medications w/o ov in the past.    Summary: prednisone for allergy and asthma issues   Pt called and asked for an appt today / no appts until Monday were available / she asked if Dr. Caryn Section can send her in round of prednisone for her allergies and asthma issues / please advise       Reason for Disposition  Prescription request for new medicine (not a refill)  Answer Assessment - Initial Assessment Questions 1. DRUG NAME: "What medicine do you need to have refilled?"     Prednisone and an antibiotic 2. REFILLS REMAINING: "How many refills are remaining?" (Note: The label on the medicine or pill bottle will show how many refills are remaining. If there are no refills remaining, then a renewal may be needed.)      3. EXPIRATION DATE: "What is the expiration date?" (Note: The label states when the prescription will expire, and thus can no longer be refilled.)      4. PRESCRIBING HCP: "Who prescribed it?" Reason: If prescribed by specialist, call should be referred to that group.      5. SYMPTOMS: "Do you have any symptoms?"     Allergy and asthma 6. PREGNANCY: "Is there any chance that you are pregnant?" "When was your last menstrual period?"  Protocols used: Medication Refill and Renewal Call-A-AH

## 2022-06-03 NOTE — Addendum Note (Signed)
Addended by: Birdie Sons on: 06/03/2022 12:53 PM   Modules accepted: Orders

## 2022-06-03 NOTE — Telephone Encounter (Signed)
Copied from [place] [id number]. Topic: General - Other >> Jun 03, 2022 11:46 AM Sabas Sous wrote: Reason for CRM: Pt called to check status of prednisone request, says she wants this before the weekend begins. (Info already routed from NT to PCP)

## 2022-06-13 ENCOUNTER — Encounter: Payer: Self-pay | Admitting: Family Medicine

## 2022-06-13 ENCOUNTER — Ambulatory Visit: Payer: BC Managed Care – PPO | Admitting: Family Medicine

## 2022-06-13 VITALS — BP 134/80 | HR 96 | Resp 16 | Wt 281.0 lb

## 2022-06-13 DIAGNOSIS — G8929 Other chronic pain: Secondary | ICD-10-CM

## 2022-06-13 DIAGNOSIS — M5416 Radiculopathy, lumbar region: Secondary | ICD-10-CM

## 2022-06-13 DIAGNOSIS — M5136 Other intervertebral disc degeneration, lumbar region: Secondary | ICD-10-CM | POA: Diagnosis not present

## 2022-06-13 DIAGNOSIS — M7651 Patellar tendinitis, right knee: Secondary | ICD-10-CM | POA: Diagnosis not present

## 2022-06-13 DIAGNOSIS — M545 Low back pain, unspecified: Secondary | ICD-10-CM

## 2022-06-13 MED ORDER — NABUMETONE 500 MG PO TABS: 1000.0000 mg | ORAL_TABLET | Freq: Every day | ORAL | 0 refills | Status: DC

## 2022-06-13 MED ORDER — WEGOVY 0.25 MG/0.5ML ~~LOC~~ SOAJ: SUBCUTANEOUS | 0 refills | Status: DC

## 2022-06-13 MED ORDER — OXYCODONE-ACETAMINOPHEN 7.5-325 MG PO TABS: ORAL_TABLET | ORAL | 0 refills | Status: DC

## 2022-06-13 NOTE — Progress Notes (Signed)
I,April Miller,acting as a scribe for Lelon Huh, MD.,have documented all relevant documentation on the behalf of Lelon Huh, MD,as directed by  Lelon Huh, MD while in the presence of Lelon Huh, MD.   Established patient visit   Patient: Erin Sherman   DOB: 23-Apr-1966   56 y.o. Female  MRN: [id number] Visit Date: 06/13/2022  Today's healthcare provider: Lelon Huh, MD   Chief Complaint  Patient presents with   Knee Pain   Hip Pain   Subjective    HPI  Patient is here concerning chronic right knee and right hip pain. States he started having more pain in her right leg after taking motocycle class about a months ago. Since then has had two episodes of severe spasms in her right anterior thigh around to right side of hip. Has had to take 2 oxycodone/apap to help relieve pain.   She also states she continues to have hoarseness and sinus congestion for which she take Zyrtec D. She has taken Flonase in the past but not used for several months.   She also wants to get beck on Wegovy which she initially did well with but was not able to get refill after finishing first month due to PA. Since then PA had a favorable outcome on 05/04/2022 but pharmacy never contacted her to dispense medication.   Medications: Outpatient Medications Prior to Visit  Medication Sig   albuterol (VENTOLIN HFA) 108 (90 Base) MCG/ACT inhaler TAKE 2 PUFFS BY MOUTH EVERY 6 HOURS AS NEEDED   aspirin 81 MG EC tablet Take 1 tablet (81 mg total) by mouth daily. Swallow whole.   azelastine (ASTELIN) 0.1 % nasal spray Place 2 sprays into both nostrils 2 (two) times daily. Use in each nostril as directed   benzonatate (TESSALON) 200 MG capsule Take 1 capsule (200 mg total) by mouth 3 (three) times daily as needed for cough.   budesonide-formoterol (SYMBICORT) 160-4.5 MCG/ACT inhaler Inhale 2 puffs into the lungs 2 (two) times daily.   calcium carbonate (TUMS - DOSED IN MG ELEMENTAL CALCIUM) 500 MG  chewable tablet Chew 1 tablet by mouth daily as needed for indigestion.   cholecalciferol (VITAMIN D) 1000 UNITS tablet Take 1,000 Units by mouth daily.   EPINEPHRINE 0.3 mg/0.3 mL IJ SOAJ injection INJECT 0.3 MLS (0.3 MG TOTAL) INTO THE MUSCLE AS NEEDED. WITH ALLERGIC REACTION   ipratropium-albuterol (DUONEB) 0.5-2.5 (3) MG/3ML SOLN Inhale 3 mLs into the lungs every 6 (six) hours as needed. Reported on 12/28/2015   levalbuterol (XOPENEX HFA) 45 MCG/ACT inhaler Inhale 2 puffs into the lungs every 6 (six) hours as needed for wheezing. May substitute generic   meloxicam (MOBIC) 15 MG tablet Take 1 tablet (15 mg total) by mouth daily.   mometasone (NASONEX) 50 MCG/ACT nasal spray Place 2 sprays into the nose daily.   montelukast (SINGULAIR) 10 MG tablet Take 1 tablet (10 mg total) by mouth at bedtime.   oxyCODONE-acetaminophen (PERCOCET) 7.5-325 MG tablet Take 1 tablet by mouth every 6 (six) hours as needed for severe pain.   phentermine 37.5 MG capsule Take 1 capsule (37.5 mg total) by mouth every morning. DO NOT TAKE THIS MEDICATION WHEN TAKING ZYRTEC-D   promethazine (PHENERGAN) 25 MG tablet Take 1 tablet (25 mg total) by mouth every 8 (eight) hours as needed for nausea.   Semaglutide-Weight Management (WEGOVY) 0.25 MG/0.5ML SOAJ Inject 0.5 mg into the skin once a week. Inject 0.'25mg'$  once a week for 2-3 weeks, then increase to  0.'5mg'$  once a week   sodium chloride HYPERTONIC 3 % nebulizer solution Use one- 15 mL ampule, up to 4 times daily, as needed, via nebulizer for cough/chest tightness.   Spacer/Aero-Holding Chambers (OPTICHAMBER ADVANTAGE) MISC 1 each by Other route once. Always uses her when you're using a metered-dose inhaler. You've aromatase medicine as much, he won't have his much side effect, but you it twice as much medicine and your lungs.   Tiotropium Bromide Monohydrate (SPIRIVA RESPIMAT) 2.5 MCG/ACT AERS Inhale 2 puffs into the lungs daily.   triamcinolone cream (KENALOG) 0.5 % Apply 1  application. topically 3 (three) times daily.   ZYRTEC-D ALLERGY & CONGESTION 5-120 MG tablet TAKE 1 TABLET BY MOUTH DAILY. AS NEEDED FOR ALLERGIES CAN RAISE BLOOD PRESSURE   No facility-administered medications prior to visit.    Review of Systems  Constitutional:  Negative for appetite change, chills, fatigue and fever.  Respiratory:  Negative for chest tightness and shortness of breath.   Cardiovascular:  Negative for chest pain and palpitations.  Gastrointestinal:  Negative for abdominal pain, nausea and vomiting.  Musculoskeletal:  Positive for arthralgias.  Neurological:  Negative for dizziness and weakness.       Objective    BP 134/80 (BP Location: Right Arm, Patient Position: Sitting, Cuff Size: Large)   Pulse 96   Resp 16   Wt 281 lb (127.5 kg)   SpO2 100%   BMI 54.88 kg/m    Physical Exam  Tender proximal patellar tendon. Some pain but +5 strength right knee extension.   Assessment & Plan     1. Patellar tendinitis of right knee - nabumetone (RELAFEN) 500 MG tablet; Take 2 tablets (1,000 mg total) by mouth daily.  Dispense: 30 tablet; Refill: 0   2. DDD (degenerative disc disease), lumbar Is having to take 2 oxycodone/apap. Refill oxyCODONE-acetaminophen (PERCOCET) 7.5-325 MG tablet; Take one to 2 tablets every eight hours as needed for pain  Dispense: 30 tablet; Refill: 0  3. Lumbar radiculitis  4. Chronic midline low back pain without sciatica  5. Morbid obesity ([place]) PA for [facility] was favorable 05/04/2022, but has been off of medication for several months.   Rx starting dose.  - Semaglutide-Weight Management (WEGOVY) 0.25 MG/0.5ML SOAJ; Inject 0.'25mg'$  once a week for 4 weeks, then increase to 0.'5mg'$  once a week  Dispense: 2 mL; Refill: 0      The entirety of the information documented in the History of Present Illness, Review of Systems and Physical Exam were personally obtained by me. Portions of this information were initially documented by the CMA and  reviewed by me for thoroughness and accuracy.     Lelon Huh, MD  [facility] [phone] (phone) [phone] (fax)  [place]

## 2022-06-17 ENCOUNTER — Telehealth: Payer: Self-pay | Admitting: Family Medicine

## 2022-06-17 NOTE — Telephone Encounter (Signed)
Please check with patient and pharmacy regarding [facility] approval. We received notice in November that it was approved and prescription was sent to [facility] in [place] on 06/13/2022. Was she able to get it filled. If so then will send prescription for 0.'5mg'$  dose in 4 weeks.

## 2022-06-23 ENCOUNTER — Ambulatory Visit: Payer: BC Managed Care – PPO | Admitting: Physician Assistant

## 2022-06-23 ENCOUNTER — Encounter: Payer: Self-pay | Admitting: Physician Assistant

## 2022-06-23 VITALS — BP 111/73 | HR 87 | Resp 16 | Wt 280.0 lb

## 2022-06-23 DIAGNOSIS — J4540 Moderate persistent asthma, uncomplicated: Secondary | ICD-10-CM | POA: Diagnosis not present

## 2022-06-23 DIAGNOSIS — J329 Chronic sinusitis, unspecified: Secondary | ICD-10-CM | POA: Diagnosis not present

## 2022-06-23 MED ORDER — FLUTICASONE PROPIONATE 50 MCG/ACT NA SUSP: 2.0000 | Freq: Every day | NASAL | 6 refills | Status: DC

## 2022-06-23 NOTE — Progress Notes (Signed)
I,April Miller,acting as a Education administrator for [name], PA-C.,have documented all relevant documentation on the behalf of [name], PA-C,as directed by  [name], PA-C while in the presence of [name], PA-C.   Established patient visit   Patient: Erin Sherman   DOB: March 30, 1966   56 y.o. Female  MRN: [id number] Visit Date: 06/23/2022  Today's healthcare provider: [name], PA-C   Chief Complaint  Patient presents with   Cough   Subjective    HPI  Patient has been having shortness of breath and chest tightness for 2 days. Patient also has cough, headache, and congestion. No fever.   Medications: Outpatient Medications Prior to Visit  Medication Sig   albuterol (VENTOLIN HFA) 108 (90 Base) MCG/ACT inhaler TAKE 2 PUFFS BY MOUTH EVERY 6 HOURS AS NEEDED   aspirin 81 MG EC tablet Take 1 tablet (81 mg total) by mouth daily. Swallow whole.   azelastine (ASTELIN) 0.1 % nasal spray Place 2 sprays into both nostrils 2 (two) times daily. Use in each nostril as directed   benzonatate (TESSALON) 200 MG capsule Take 1 capsule (200 mg total) by mouth 3 (three) times daily as needed for cough.   budesonide-formoterol (SYMBICORT) 160-4.5 MCG/ACT inhaler Inhale 2 puffs into the lungs 2 (two) times daily.   calcium carbonate (TUMS - DOSED IN MG ELEMENTAL CALCIUM) 500 MG chewable tablet Chew 1 tablet by mouth daily as needed for indigestion.   Cetirizine HCl (ZYRTEC PO) Take by mouth.   cholecalciferol (VITAMIN D) 1000 UNITS tablet Take 1,000 Units by mouth daily.   EPINEPHRINE 0.3 mg/0.3 mL IJ SOAJ injection INJECT 0.3 MLS (0.3 MG TOTAL) INTO THE MUSCLE AS NEEDED. WITH ALLERGIC REACTION   ipratropium-albuterol (DUONEB) 0.5-2.5 (3) MG/3ML SOLN Inhale 3 mLs into the lungs every 6 (six) hours as needed. Reported on 12/28/2015   levalbuterol (XOPENEX HFA) 45 MCG/ACT inhaler Inhale 2 puffs into the lungs every 6 (six) hours as needed for wheezing. May substitute generic    mometasone (NASONEX) 50 MCG/ACT nasal spray Place 2 sprays into the nose daily.   montelukast (SINGULAIR) 10 MG tablet Take 1 tablet (10 mg total) by mouth at bedtime.   nabumetone (RELAFEN) 500 MG tablet Take 2 tablets (1,000 mg total) by mouth daily.   oxyCODONE-acetaminophen (PERCOCET) 7.5-325 MG tablet Take one to 2 tablets every eight hours as needed for pain   phentermine 37.5 MG capsule Take 1 capsule (37.5 mg total) by mouth every morning. DO NOT TAKE THIS MEDICATION WHEN TAKING ZYRTEC-D   promethazine (PHENERGAN) 25 MG tablet Take 1 tablet (25 mg total) by mouth every 8 (eight) hours as needed for nausea.   Semaglutide-Weight Management (WEGOVY) 0.25 MG/0.5ML SOAJ Inject 0.'25mg'$  once a week for 4 weeks, then increase to 0.'5mg'$  once a week   sodium chloride HYPERTONIC 3 % nebulizer solution Use one- 15 mL ampule, up to 4 times daily, as needed, via nebulizer for cough/chest tightness.   Spacer/Aero-Holding Chambers (OPTICHAMBER ADVANTAGE) MISC 1 each by Other route once. Always uses her when you're using a metered-dose inhaler. You've aromatase medicine as much, he won't have his much side effect, but you it twice as much medicine and your lungs.   Tiotropium Bromide Monohydrate (SPIRIVA RESPIMAT) 2.5 MCG/ACT AERS Inhale 2 puffs into the lungs daily.   triamcinolone cream (KENALOG) 0.5 % Apply 1 application. topically 3 (three) times daily.   [DISCONTINUED] ZYRTEC-D ALLERGY & CONGESTION 5-120 MG tablet TAKE 1 TABLET BY MOUTH DAILY. AS  NEEDED FOR ALLERGIES CAN RAISE BLOOD PRESSURE (Patient not taking: Reported on 06/23/2022)   No facility-administered medications prior to visit.    Review of Systems  Constitutional:  Negative for appetite change, chills, fatigue and fever.  Respiratory:  Positive for chest tightness and shortness of breath.   Cardiovascular:  Negative for chest pain and palpitations.  Gastrointestinal:  Negative for abdominal pain, nausea and vomiting.  Neurological:   Negative for dizziness and weakness.       Objective    BP 111/73 (BP Location: Left Arm, Patient Position: Sitting, Cuff Size: Large)   Pulse 87   Resp 16   Wt 280 lb (127 kg)   SpO2 100%   BMI 54.68 kg/m    Physical Exam Vitals reviewed.  Constitutional:      General: She is not in acute distress.    Appearance: Normal appearance. She is well-developed. She is obese. She is not diaphoretic.  HENT:     Head: Normocephalic and atraumatic.     Right Ear: Ear canal and external ear normal. There is impacted cerumen (partially).     Left Ear: Tympanic membrane, ear canal and external ear normal. There is no impacted cerumen.     Nose: Congestion (mild) and rhinorrhea (mild) present.  Eyes:     General: No scleral icterus.    Conjunctiva/sclera: Conjunctivae normal.  Neck:     Thyroid: No thyromegaly.  Cardiovascular:     Rate and Rhythm: Normal rate and regular rhythm.     Pulses: Normal pulses.     Heart sounds: Normal heart sounds. No murmur heard. Pulmonary:     Effort: Pulmonary effort is normal. No respiratory distress.     Breath sounds: Normal breath sounds. No wheezing, rhonchi or rales.  Musculoskeletal:        General: Normal range of motion.     Cervical back: Neck supple.     Right lower leg: No edema.     Left lower leg: No edema.  Lymphadenopathy:     Cervical: No cervical adenopathy.  Skin:    General: Skin is warm and dry.     Findings: No rash.  Neurological:     Mental Status: She is alert and oriented to person, place, and time. Mental status is at baseline.  Psychiatric:        Behavior: Behavior normal.        Thought Content: Thought content normal.        Judgment: Judgment normal.     No results found for any visits on 06/23/22.  Assessment & Plan     1. Recurrent sinusitis Most likely allergic vs viral etiology Normal vitals including oxygen saturation/100% - POCT Influenza A/B neg Covid test at home was negative  Advised to  continue with Zyrtec Start nasal saline rinse/spray and continue t +o use Flonase as directed - fluticasone (FLONASE) 50 MCG/ACT nasal spray; Place 2 sprays into both nostrils daily.  Dispense: 16 g; Refill: 6 Increase fluids.  Rest.  Probiotic.  Mucinex as directed.  Humidifier in bedroom. Flonase per orders.  Call or return to clinic if symptoms are not improving.  In the setting of chronic asthma Per pt, her ox sat will drop with exertion. Encouraged to continue with her current inhalers:  Symbicort 160-4.5 mcg, Spiriva 2.5 mcg Pt declines prednisone taper. Recommended to call/see Dr. Mortimer Fries ASAP Pt was provided a work note for the next three days. The patient was advised to call back or seek  an in-person evaluation if the symptoms worsen or if the condition fails to improve as anticipated.  I discussed the assessment and treatment plan with the patient. The patient was provided an opportunity to ask questions and all were answered. The patient agreed with the plan and demonstrated an understanding of the instructions.  The entirety of the information documented in the History of Present Illness, Review of Systems and Physical Exam were personally obtained by me. Portions of this information were initially documented by the CMA and reviewed by me for thoroughness and accuracy.  [name], [facility], [place] [phone] (phone) [phone] (fax)

## 2022-06-24 ENCOUNTER — Other Ambulatory Visit: Payer: Self-pay | Admitting: Family Medicine

## 2022-06-24 DIAGNOSIS — M7651 Patellar tendinitis, right knee: Secondary | ICD-10-CM

## 2022-06-24 LAB — POCT INFLUENZA A/B

## 2022-06-24 NOTE — Telephone Encounter (Signed)
Unable to refill per protocol, last refill by  provider 06/13/22. Will refuse duplicate request.  Requested Prescriptions  Pending Prescriptions Disp Refills   nabumetone (RELAFEN) 500 MG tablet [Pharmacy Med Name: NABUMETONE 500 MG TABLET] 30 tablet 0    Sig: TAKE 2 TABLETS BY MOUTH EVERY DAY     Analgesics:  NSAIDS Failed - 06/24/2022  1:16 PM      Failed - Manual Review: Labs are only required if the patient has taken medication for more than 8 weeks.      Failed - Cr in normal range and within 360 days    Creatinine  Date Value Ref Range Status  09/30/2014 0.70 mg/dL Final    Comment:    0.44-1.00 NOTE: New Reference Range  09/09/14    Creatinine, Ser  Date Value Ref Range Status  01/17/2021 0.67 0.44 - 1.00 mg/dL Final         Failed - HGB in normal range and within 360 days    Hemoglobin  Date Value Ref Range Status  01/17/2021 9.3 (L) 12.0 - 15.0 g/dL Final  02/28/2020 11.8 11.1 - 15.9 g/dL Final         Failed - PLT in normal range and within 360 days    Platelets  Date Value Ref Range Status  01/17/2021 333 150 - 400 K/uL Final  02/28/2020 327 150 - 450 x10E3/uL Final         Failed - HCT in normal range and within 360 days    HCT  Date Value Ref Range Status  01/17/2021 30.3 (L) 36.0 - 46.0 % Final   Hematocrit  Date Value Ref Range Status  02/28/2020 37.4 34.0 - 46.6 % Final         Failed - eGFR is 30 or above and within 360 days    EGFR (African American)  Date Value Ref Range Status  09/30/2014 >60  Final   GFR calc Af Amer  Date Value Ref Range Status  02/28/2020 119 >59 mL/min/1.73 Final    Comment:    **[facility] currently reports eGFR in compliance with the current**   recommendations of the [employer]. [facility] will   update reporting as new guidelines are published from the NKF-ASN   Task force.    EGFR (Non-African Amer.)  Date Value Ref Range Status  09/30/2014 >60  Final    Comment:    eGFR values <39m/min/1.73  m2 may be an indication of chronic kidney disease (CKD). Calculated eGFR is useful in patients with stable renal function. The eGFR calculation will not be reliable in acutely ill patients when serum creatinine is changing rapidly. It is not useful in patients on dialysis. The eGFR calculation may not be applicable to patients at the low and high extremes of body sizes, pregnant women, and vegetarians.    GFR, Estimated  Date Value Ref Range Status  01/17/2021 >60 >60 mL/min Final    Comment:    (NOTE) Calculated using the CKD-EPI Creatinine Equation (2021)          Passed - Patient is not pregnant      Passed - Valid encounter within last 12 months    Recent Outpatient Visits           [facility] [place] [facility]   1 week ago Patellar tendinitis of right knee   [facility]   2 months ago Morbid obesity ([place], [place]   3 months ago Chronic fatigue   [place], [place]   3 months ago Moderate persistent chronic asthma with acute exacerbation   [facility] Gwyneth Sprout, FNP

## 2022-06-29 ENCOUNTER — Ambulatory Visit (INDEPENDENT_AMBULATORY_CARE_PROVIDER_SITE_OTHER): Payer: BC Managed Care – PPO

## 2022-06-29 DIAGNOSIS — R5382 Chronic fatigue, unspecified: Secondary | ICD-10-CM

## 2022-06-29 MED ORDER — CYANOCOBALAMIN 1000 MCG/ML IJ SOLN
1000.0000 ug | Freq: Once | INTRAMUSCULAR | Status: AC
  Administered 2022-06-29: 1000 ug via INTRAMUSCULAR

## 2022-07-14 ENCOUNTER — Telehealth: Payer: Self-pay | Admitting: Family Medicine

## 2022-07-14 ENCOUNTER — Other Ambulatory Visit: Payer: Self-pay | Admitting: Family Medicine

## 2022-07-14 DIAGNOSIS — J4541 Moderate persistent asthma with (acute) exacerbation: Secondary | ICD-10-CM

## 2022-07-14 DIAGNOSIS — M7651 Patellar tendinitis, right knee: Secondary | ICD-10-CM

## 2022-07-15 NOTE — Telephone Encounter (Signed)
Requested medication (s) are due for refill today: yes  Requested medication (s) are on the active medication list: yes  Last refill:  Relafen 06/13/22 #30     Wegovy 06/13/22 2 ml  Future visit scheduled: no  Notes to clinic:  overdue lab work and- the [employer] is an active med - not sure what to do to reflect that-   Requested Prescriptions  Pending Prescriptions Disp Refills   nabumetone (RELAFEN) 500 MG tablet [Pharmacy Med Name: NABUMETONE 500 MG TABLET] 30 tablet 0    Sig: TAKE 2 TABLETS BY MOUTH EVERY DAY     Analgesics:  NSAIDS Failed - 07/14/2022  4:07 PM      Failed - Manual Review: Labs are only required if the patient has taken medication for more than 8 weeks.      Failed - Cr in normal range and within 360 days    Creatinine  Date Value Ref Range Status  09/30/2014 0.70 mg/dL Final    Comment:    0.44-1.00 NOTE: New Reference Range  09/09/14    Creatinine, Ser  Date Value Ref Range Status  01/17/2021 0.67 0.44 - 1.00 mg/dL Final         Failed - HGB in normal range and within 360 days    Hemoglobin  Date Value Ref Range Status  01/17/2021 9.3 (L) 12.0 - 15.0 g/dL Final  02/28/2020 11.8 11.1 - 15.9 g/dL Final         Failed - PLT in normal range and within 360 days    Platelets  Date Value Ref Range Status  01/17/2021 333 150 - 400 K/uL Final  02/28/2020 327 150 - 450 x10E3/uL Final         Failed - HCT in normal range and within 360 days    HCT  Date Value Ref Range Status  01/17/2021 30.3 (L) 36.0 - 46.0 % Final   Hematocrit  Date Value Ref Range Status  02/28/2020 37.4 34.0 - 46.6 % Final         Failed - eGFR is 30 or above and within 360 days    EGFR (African American)  Date Value Ref Range Status  09/30/2014 >60  Final   GFR calc Af Amer  Date Value Ref Range Status  02/28/2020 119 >59 mL/min/1.73 Final    Comment:    **[facility] currently reports eGFR in compliance with the current**   recommendations of the [employer]. [facility] will   update reporting as new guidelines are published from the NKF-ASN   Task force.    EGFR (Non-African Amer.)  Date Value Ref Range Status  09/30/2014 >60  Final    Comment:    eGFR values <24m/min/1.73 m2 may be an indication of chronic kidney disease (CKD). Calculated eGFR is useful in patients with stable renal function. The eGFR calculation will not be reliable in acutely ill patients when serum creatinine is changing rapidly. It is not useful in patients on dialysis. The eGFR calculation may not be applicable to patients at the low and high extremes of body sizes, pregnant women, and vegetarians.    GFR, Estimated  Date Value Ref Range Status  01/17/2021 >60 >60 mL/min Final    Comment:    (NOTE) Calculated using the CKD-EPI Creatinine Equation (2021)          Passed - Patient is not pregnant      Passed - Valid encounter within last 12 months    Recent Outpatient  Visits           2 weeks ago Chronic fatigue   [place], [place], CMA   3 weeks ago Recurrent sinusitis   [facility] [place], [place], [place]   1 month ago Patellar tendinitis of right knee   [facility] Birdie Sons, MD   2 months ago Morbid obesity [facility])   [facility] Birdie Sons, MD   3 months ago Chronic fatigue   [facility] Birdie Sons, MD               WEGOVY 0.5 MG/0.5ML Marcie Mowers Med Name: WEGOVY 0.5 MG/0.5 ML PEN]      Sig: Inject 0.5 mg into the skin once a week.     Endocrinology:  Diabetes - GLP-1 Receptor Agonists - semaglutide Failed - 07/14/2022  4:07 PM      Failed - HBA1C in normal range and within 180 days    No results found for: "HGBA1C", "LABA1C"       Failed - Cr in normal range and within 360 days    Creatinine  Date Value Ref Range Status  09/30/2014 0.70 mg/dL Final    Comment:    0.44-1.00 NOTE: New Reference Range  09/09/14     Creatinine, Ser  Date Value Ref Range Status  01/17/2021 0.67 0.44 - 1.00 mg/dL Final         Passed - Valid encounter within last 6 months    Recent Outpatient Visits           2 weeks ago Chronic fatigue   [place], [place], CMA   3 weeks ago Recurrent sinusitis   [facility] [place], [place], PA-C   1 month ago Patellar tendinitis of right knee   [facility] Birdie Sons, MD   2 months ago Morbid obesity [facility])   [facility] Birdie Sons, MD   3 months ago Chronic fatigue   [facility] Birdie Sons, MD

## 2022-07-15 NOTE — Telephone Encounter (Signed)
Requested Prescriptions  Pending Prescriptions Disp Refills   benzonatate (TESSALON) 200 MG capsule [Pharmacy Med Name: BENZONATATE 200 MG CAPSULE] 90 capsule 0    Sig: TAKE 1 CAPSULE (200 MG TOTAL) BY MOUTH 3 (THREE) TIMES DAILY AS NEEDED FOR COUGH.     Ear, Nose, and Throat:  Antitussives/Expectorants Passed - 07/14/2022  4:07 PM      Passed - Valid encounter within last 12 months    Recent Outpatient Visits           2 weeks ago Chronic fatigue   [place], [place], CMA   3 weeks ago Recurrent sinusitis   [facility] [place], [place], PA-C   1 month ago Patellar tendinitis of right knee   [facility] Birdie Sons, MD   2 months ago Morbid obesity [facility])   [facility] Birdie Sons, MD   3 months ago Chronic fatigue   [facility] Birdie Sons, MD

## 2022-07-16 NOTE — Telephone Encounter (Signed)
Did patient every start the 0.'25mg'$  shots that were prescribed in December. If not then need to start 0.'25mg'$ .... if she has been taking 0.25 then will need to increase to 0.5

## 2022-07-18 ENCOUNTER — Other Ambulatory Visit: Payer: Self-pay

## 2022-07-18 MED ORDER — WEGOVY 0.25 MG/0.5ML ~~LOC~~ SOAJ
SUBCUTANEOUS | 0 refills | Status: DC
  Filled 2022-07-18: qty 2, 28d supply, fill #0

## 2022-07-19 ENCOUNTER — Other Ambulatory Visit: Payer: Self-pay

## 2022-07-21 ENCOUNTER — Other Ambulatory Visit: Payer: Self-pay | Admitting: Family Medicine

## 2022-07-27 ENCOUNTER — Ambulatory Visit (INDEPENDENT_AMBULATORY_CARE_PROVIDER_SITE_OTHER): Payer: BC Managed Care – PPO

## 2022-07-27 DIAGNOSIS — R5382 Chronic fatigue, unspecified: Secondary | ICD-10-CM

## 2022-07-27 MED ORDER — CYANOCOBALAMIN 1000 MCG/ML IJ SOLN
1000.0000 ug | Freq: Once | INTRAMUSCULAR | Status: AC
  Administered 2022-07-27: 1000 ug via INTRAMUSCULAR

## 2022-08-04 ENCOUNTER — Ambulatory Visit: Payer: Self-pay

## 2022-08-04 NOTE — Telephone Encounter (Signed)
  Chief Complaint: Bloated abdomen Symptoms: above Frequency: this week Pertinent Negatives: Patient denies pain Disposition: '[]'$ ED /'[x]'$ Urgent Care (no appt availability in office) / '[]'$ Appointment(In office/virtual)/ '[]'$  [place] [facility]/ '[]'$ Home Care/ '[]'$ Refused Recommended Disposition /'[]'$ [place] [facility]/ '[]'$  Follow-up with PCP Additional Notes: PT states that whenever she eats her abdomen bloats. Bloating reduces a bit with BM. PT has started taking a laxative to help with BM. PT wanted appt only with Dr. Caryn Section. Pt will go to UC. Follow up appt scheduled for 08/24/2022.
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
## 2022-08-08 ENCOUNTER — Other Ambulatory Visit: Payer: Self-pay

## 2022-08-19 ENCOUNTER — Other Ambulatory Visit: Payer: Self-pay

## 2022-08-22 ENCOUNTER — Emergency Department
Admission: EM | Admit: 2022-08-22 | Discharge: 2022-08-22 | Disposition: A | Discharge status: Home / Self Care | Payer: BC Managed Care – PPO | Attending: Emergency Medicine | Admitting: Emergency Medicine

## 2022-08-22 ENCOUNTER — Other Ambulatory Visit: Payer: Self-pay

## 2022-08-22 ENCOUNTER — Ambulatory Visit: Payer: Self-pay

## 2022-08-22 DIAGNOSIS — J45909 Unspecified asthma, uncomplicated: Secondary | ICD-10-CM | POA: Insufficient documentation

## 2022-08-22 DIAGNOSIS — U071 COVID-19: Secondary | ICD-10-CM | POA: Insufficient documentation

## 2022-08-22 DIAGNOSIS — R0981 Nasal congestion: Secondary | ICD-10-CM | POA: Diagnosis present

## 2022-08-22 LAB — RESP PANEL BY RT-PCR (RSV, FLU A&B, COVID)  RVPGX2
Influenza A by PCR: NEGATIVE
Influenza B by PCR: NEGATIVE
Resp Syncytial Virus by PCR: NEGATIVE
SARS Coronavirus 2 by RT PCR: POSITIVE — AB

## 2022-08-22 MED ORDER — DEXAMETHASONE SODIUM PHOSPHATE 10 MG/ML IJ SOLN
10.0000 mg | Freq: Once | INTRAMUSCULAR | Status: AC
  Administered 2022-08-22: 10 mg via INTRAMUSCULAR
  Filled 2022-08-22: qty 1

## 2022-08-22 MED ORDER — BENZONATATE 100 MG PO CAPS: 100.0000 mg | ORAL_CAPSULE | Freq: Three times a day (TID) | ORAL | 0 refills | Status: DC | PRN

## 2022-08-22 MED ORDER — ALBUTEROL SULFATE (2.5 MG/3ML) 0.083% IN NEBU
2.5000 mg | INHALATION_SOLUTION | Freq: Once | RESPIRATORY_TRACT | Status: AC
  Administered 2022-08-22: 2.5 mg via RESPIRATORY_TRACT
  Filled 2022-08-22: qty 3

## 2022-08-22 MED ORDER — PSEUDOEPH-BROMPHEN-DM 30-2-10 MG/5ML PO SYRP: 10.0000 mL | ORAL_SOLUTION | Freq: Four times a day (QID) | ORAL | 0 refills | Status: DC | PRN

## 2022-08-22 MED ORDER — PREDNISONE 50 MG PO TABS: 50.0000 mg | ORAL_TABLET | Freq: Every day | ORAL | 0 refills | Status: DC

## 2022-08-22 NOTE — ED Provider Notes (Signed)
[facility] Provider Note  Patient Contact: 5:38 PM (approximate)   History   Nasal Congestion and Asthma   HPI  Erin Sherman is a 57 y.o. female who presents the emergency department with cough, congestion, shortness of breath.  Patient has a history of asthma states that the symptoms have been increasing her asthma symptoms without true acute exacerbation.  Patient has been using her albuterol inhaler as well as her nebulizer.  Denies any chest pain.  Patient states that she believes she has COVID     Physical Exam   Triage Vital Signs: ED Triage Vitals  Enc Vitals Group     BP 08/22/22 1337 124/63     Pulse Rate 08/22/22 1337 98     Resp 08/22/22 1337 20     Temp 08/22/22 1337 99.2 F (37.3 C)     Temp Source 08/22/22 1337 Oral     SpO2 08/22/22 1337 100 %     Weight 08/22/22 1338 280 lb (127 kg)     Height 08/22/22 1338 5' (1.524 m)     Head Circumference --      Peak Flow --      Pain Score 08/22/22 1338 8     Pain Loc --      Pain Edu? --      Excl. in [place]? --     Most recent vital signs: Vitals:   08/22/22 1337  BP: 124/63  Pulse: 98  Resp: 20  Temp: 99.2 F (37.3 C)  SpO2: 100%     General: Alert and in no acute distress. ENT:      Ears:       Nose: Positive congestion/rhinnorhea.      Mouth/Throat: Mucous membranes are moist. Neck: No stridor. No cervical spine tenderness to palpation.  Cardiovascular:  Good peripheral perfusion Respiratory: Normal respiratory effort without tachypnea or retractions. Lungs CTAB. Good air entry to the bases with no decreased or absent breath sounds Musculoskeletal: Full range of motion to all extremities.  Neurologic:  No gross focal neurologic deficits are appreciated.  Skin:   No rash noted Other:   ED Results / Procedures / Treatments   Labs (all labs ordered are listed, but only abnormal results are displayed) Labs Reviewed  RESP PANEL BY RT-PCR (RSV, FLU A&B, COVID)   [id number] - Abnormal; Notable for the following components:      Result Value   SARS Coronavirus 2 by RT PCR POSITIVE (*)    All other components within normal limits     EKG     RADIOLOGY    No results found.  PROCEDURES:  Critical Care performed: No  Procedures   MEDICATIONS ORDERED IN ED: Medications  dexamethasone (DECADRON) injection 10 mg (has no administration in time range)  albuterol (PROVENTIL) (2.5 MG/3ML) 0.083% nebulizer solution 2.5 mg (2.5 mg Nebulization Given 08/22/22 1343)     IMPRESSION / MDM / ASSESSMENT AND PLAN / ED COURSE  I reviewed the triage vital signs and the nursing notes.                                 Differential diagnosis includes, but is not limited to, COVID, flu, RSV, viral illness, asthma exacerbation  Patient's presentation is most consistent with acute presentation with potential threat to life or bodily function.   Patient's diagnosis is consistent with COVID-19.  Patient presents emergency department with viral  symptoms.  Patient has had positive exposures to flu and COVID.  Was positive for COVID-19 on testing.  Has a history of asthma and is concerned that this could increase her asthma.  She is using inhalers at home.  I will prescribe a steroid, symptom control medication for the patient.  Concerning signs and symptoms and return precautions discussed with the patient.  No indication for further workup currently..  Follow-up primary care as needed.  Patient is given ED precautions to return to the ED for any worsening or new symptoms.     FINAL CLINICAL IMPRESSION(S) / ED DIAGNOSES   Final diagnoses:  COVID-19     Rx / DC Orders   ED Discharge Orders          Ordered    predniSONE (DELTASONE) 50 MG tablet  Daily with breakfast        08/22/22 1739    benzonatate (TESSALON PERLES) 100 MG capsule  3 times daily PRN        08/22/22 1739    brompheniramine-pseudoephedrine-DM 30-2-10 MG/5ML syrup  4 times daily PRN         08/22/22 1739             Note:  This document was prepared using Dragon voice recognition software and may include unintentional dictation errors.   Brynda Peon 08/22/22 1741    Arta Silence, MD 08/23/22 [phone]

## 2022-08-22 NOTE — Telephone Encounter (Signed)
Patient is calling from ED- she is not wanting to wait any longer and wants appointment at office instead. Patient advised one of her test- COVID has resulted positive and she is advised not to leave- she needs to discuss her treatment with the provider there. Patient states she will stay.

## 2022-08-22 NOTE — Telephone Encounter (Signed)
  Chief Complaint: SOB - asthma attack - on top of URI Symptoms: SOB - congestion, wheezing Frequency: the weekend Pertinent Negatives: Patient denies  Disposition: [x]$ ED /[]$ Urgent Care (no appt availability in office) / []$ Appointment(In office/virtual)/ []$  [facility]/ []$ Home Care/ []$ Refused Recommended Disposition /[]$ [facility]/ []$  Follow-up with PCP Additional Notes: PT states that this has been going on since the weekend. She has used all of her medications. PT states she is out of breath walking to the bathroom, standing up.   PT will go to ED, however she would rather Dr. Caryn Section call in antibiotics and steroids.  Please advise.     Reason for Disposition  [1] MODERATE asthma attack (e.g., SOB at rest, speaks in phrases, audible wheezes) AND [2] not resolved after 2 or 3 inhaler or nebulizer treatments given 20 minutes apart  Answer Assessment - Initial Assessment Questions 1. RESPIRATORY STATUS: "Describe your breathing?" (e.g., wheezing, shortness of breath, unable to speak, severe coughing)      Short of breath, wheezing 2. ONSET: "When did this asthma attack begin?"      Since the weekend 3. TRIGGER: "What do you think triggered this attack?" (e.g., URI, exposure to pollen or other allergen, tobacco smoke)      Cold 4. PEAK EXPIRATORY FLOW RATE (PEFR): "Do you use a peak flow meter?" If Yes, ask: "What's the current peak flow? What's your personal best peak flow?"      No checked 5. SEVERITY: "How bad is this attack?"    - MILD: No SOB at rest, mild SOB with walking, speaks normally in sentences, can lie down, no retractions, pulse < 100. (GREEN Zone: PEFR 80-100%)   - MODERATE: SOB at rest, SOB with minimal exertion and prefers to sit, cannot lie down flat, speaks in phrases, mild retractions, audible wheezing, pulse 100-120. (YELLOW Zone: PEFR 50-79%)    - SEVERE: Struggling for each breath, speaks in single words, struggling to breathe, sitting hunched  forward, retractions, usually loud wheezing, sometimes minimal wheezing because of decreased air movement, pulse > 120. (RED Zone: PEFR < 50%).      severe 6. ASTHMA MEDICINES:  "What treatments have you tried?"    - INHALED QUICK RELIEF (RESCUE): "What is your inhaled quick-relief medicine?" (e.g., albuterol, salbutamol) "Do you use an inhaler or a nebulizer?" "How frequently have you been using this medicine?"   - CONTROLLER (LONG-TERM-CONTROL): "Do you take an inhaled steroid? (e.g., Asmanex, Flovent, Pulmicort, Qvar)     inhaler 7. INHALED QUICK-RELIEF TREATMENTS FOR THIS ATTACK: "What treatments have you given yourself so far?" and "How many and how often?" If using an inhaler, ask, "How many puffs?" Note: Routine treatments are 2 puffs every 4 hours as needed. Rescue treatments are 4 puffs repeated every 20 minutes, up to three times as needed.       8. OTHER SYMPTOMS: "Do you have any other symptoms? (e.g., chest pain, coughing up yellow sputum, fever, runny nose)     yes 9. O2 SATURATION MONITOR:  "Do you use an oxygen saturation monitor (pulse oximeter) at home?" If Yes, "What is your reading (oxygen level) today?" "What is your usual oxygen saturation reading?" (e.g., 95%)      10. PREGNANCY: "Is there any chance you are pregnant?" "When was your last menstrual period?"       no  Protocols used: Asthma Attack-A-AH

## 2022-08-22 NOTE — ED Triage Notes (Signed)
Patient c/o cough, congestion, and asthma exacerbation since yesterday.

## 2022-08-23 NOTE — Progress Notes (Deleted)
I,Gurpreet Mikhail S Parish Augustine,acting as a scribe for Lelon Huh, MD.,have documented all relevant documentation on the behalf of Lelon Huh, MD,as directed by  Lelon Huh, MD while in the presence of Lelon Huh, MD.     Established patient visit   Patient: Erin Sherman   DOB: March 18, 1966   57 y.o. Female  MRN: [id number] Visit Date: 08/24/2022  Today's healthcare provider: Lelon Huh, MD   No chief complaint on file.  Subjective    HPI  Abdominal Pain  She reports {chronicity:119221} abdominal pain. The most recent episode started {onset initial:119223} and is {progression:119226}. The abdominal pain is located in the {location:119227}{radiating to:119228}. It is described as {quality of pain (abdominal):618}, is {severity pain:119268} in intensity, occurring {frequency of symptoms:119294}. It is aggravated by {aggravated YP:6182905 and is relieved by {relieved YP:6182905. She has tried {treatments tried:119302}{improvement:119303}.  Associated symptoms: {Yes/No:20286} anorexia  {Yes/No:20286} belching  {Yes/No:20286} bloody stool {Yes/No:20286} blood in urine   {Yes/No:20286} constipation {Yes/No:20286} diarrhea  {Yes/No:20286} dysuria {Yes/No:20286} fever  {Yes/No:20286} flatus {Yes/No:20286} headaches  {Yes/No:20286} headaches {Yes/No:20286} joint pains  {Yes/No:20286} myalgias {Yes/No:20286} nausea  {Yes/No:20286} vomiting {Yes/No:20286} weight loss    Previous labs Lab Results  Component Value Date   WBC 8.6 01/17/2021   HGB 9.3 (L) 01/17/2021   HCT 30.3 (L) 01/17/2021   MCV 71.3 (L) 01/17/2021   MCH 21.9 (L) 01/17/2021   RDW 16.5 (H) 01/17/2021   PLT 333 01/17/2021   Lab Results  Component Value Date   GLUCOSE 99 01/17/2021   NA 136 01/17/2021   K 3.4 (L) 01/17/2021   CL 106 01/17/2021   CO2 23 01/17/2021   BUN 7 01/17/2021   CREATININE 0.67 01/17/2021   GFRNONAA >60 01/17/2021   GFRAA 119 02/28/2020   CALCIUM 9.0 01/17/2021   PROT 7.7  02/28/2020   ALBUMIN 4.7 02/28/2020   LABGLOB 3.0 02/28/2020   AGRATIO 1.6 02/28/2020   BILITOT 0.2 02/28/2020   ALKPHOS 82 02/28/2020   AST 24 02/28/2020   ALT 28 02/28/2020   ANIONGAP 7 01/17/2021   No results found for: "AMYLASE" -----------------------------------------------------------------------------------------   Medications: Outpatient Medications Prior to Visit  Medication Sig   albuterol (VENTOLIN HFA) 108 (90 Base) MCG/ACT inhaler TAKE 2 PUFFS BY MOUTH EVERY 6 HOURS AS NEEDED   aspirin 81 MG EC tablet Take 1 tablet (81 mg total) by mouth daily. Swallow whole.   azelastine (ASTELIN) 0.1 % nasal spray Place 2 sprays into both nostrils 2 (two) times daily. Use in each nostril as directed   benzonatate (TESSALON PERLES) 100 MG capsule Take 1 capsule (100 mg total) by mouth 3 (three) times daily as needed for cough.   brompheniramine-pseudoephedrine-DM 30-2-10 MG/5ML syrup Take 10 mLs by mouth 4 (four) times daily as needed.   budesonide-formoterol (SYMBICORT) 160-4.5 MCG/ACT inhaler Inhale 2 puffs into the lungs 2 (two) times daily.   calcium carbonate (TUMS - DOSED IN MG ELEMENTAL CALCIUM) 500 MG chewable tablet Chew 1 tablet by mouth daily as needed for indigestion.   Cetirizine HCl (ZYRTEC PO) Take by mouth.   cholecalciferol (VITAMIN D) 1000 UNITS tablet Take 1,000 Units by mouth daily.   EPINEPHRINE 0.3 mg/0.3 mL IJ SOAJ injection INJECT 0.3 MLS (0.3 MG TOTAL) INTO THE MUSCLE AS NEEDED. WITH ALLERGIC REACTION   fluticasone (FLONASE) 50 MCG/ACT nasal spray Place 2 sprays into both nostrils daily.   ipratropium-albuterol (DUONEB) 0.5-2.5 (3) MG/3ML SOLN Inhale 3 mLs into the lungs every 6 (six) hours as needed. Reported on 12/28/2015  levalbuterol (XOPENEX HFA) 45 MCG/ACT inhaler Inhale 2 puffs into the lungs every 6 (six) hours as needed for wheezing. May substitute generic   montelukast (SINGULAIR) 10 MG tablet Take 1 tablet (10 mg total) by mouth at bedtime.    nabumetone (RELAFEN) 500 MG tablet Take 2 tablets (1,000 mg total) by mouth daily as needed.   oxyCODONE-acetaminophen (PERCOCET) 7.5-325 MG tablet Take one to 2 tablets every eight hours as needed for pain   phentermine 37.5 MG capsule Take 1 capsule (37.5 mg total) by mouth every morning. DO NOT TAKE THIS MEDICATION WHEN TAKING ZYRTEC-D   predniSONE (DELTASONE) 50 MG tablet Take 1 tablet (50 mg total) by mouth daily with breakfast.   promethazine (PHENERGAN) 25 MG tablet Take 1 tablet (25 mg total) by mouth every 8 (eight) hours as needed for nausea.   Semaglutide-Weight Management (WEGOVY) 0.25 MG/0.5ML SOAJ Inject 0.45m once a week for 4 weeks, then increase to 0.537monce a week   sodium chloride HYPERTONIC 3 % nebulizer solution Use one- 15 mL ampule, up to 4 times daily, as needed, via nebulizer for cough/chest tightness.   Spacer/Aero-Holding Chambers (OPTICHAMBER ADVANTAGE) MISC 1 each by Other route once. Always uses her when you're using a metered-dose inhaler. You've aromatase medicine as much, he won't have his much side effect, but you it twice as much medicine and your lungs.   Tiotropium Bromide Monohydrate (SPIRIVA RESPIMAT) 2.5 MCG/ACT AERS Inhale 2 puffs into the lungs daily.   triamcinolone cream (KENALOG) 0.5 % Apply 1 application. topically 3 (three) times daily.   No facility-administered medications prior to visit.    Review of Systems  Gastrointestinal:  Positive for abdominal distention.    {Labs  Heme  Chem  Endocrine  Serology  Results Review (optional):23779}   Objective    There were no vitals taken for this visit. {Show previous vital signs (optional):23777}  Physical Exam  ***  No results found for any visits on 08/24/22.  Assessment & Plan     ***  No follow-ups on file.      {provider attestation***:1}   DoLelon HuhMD  [phone]) [phone])  [place]

## 2022-08-24 ENCOUNTER — Other Ambulatory Visit: Payer: Self-pay

## 2022-08-24 ENCOUNTER — Telehealth: Payer: Self-pay

## 2022-08-24 ENCOUNTER — Ambulatory Visit: Payer: BC Managed Care – PPO | Admitting: Family Medicine

## 2022-08-24 ENCOUNTER — Ambulatory Visit: Payer: BC Managed Care – PPO

## 2022-08-24 DIAGNOSIS — J4540 Moderate persistent asthma, uncomplicated: Secondary | ICD-10-CM

## 2022-08-24 MED ORDER — ALBUTEROL SULFATE HFA 108 (90 BASE) MCG/ACT IN AERS: INHALATION_SPRAY | RESPIRATORY_TRACT | 5 refills | Status: DC

## 2022-08-24 NOTE — Transitions of Care (Post Inpatient/ED Visit) (Signed)
   08/24/2022  Name: Erin Sherman MRN: [id number] DOB: [birth year]  Today's TOC FU Call Status: Today's TOC FU Call Status:: Successful TOC FU Call Competed TOC FU Call Complete Date: 08/24/22  Transition Care Management Follow-up Telephone Call Date of Discharge: 08/22/22 Discharge Facility[facility]) Type of Discharge: Emergency Department Reason for ED Visit: Respiratory Respiratory Diagnosis:  (Covid 19) How have you been since you were released from the hospital?: Better Any questions or concerns?: No  Items Reviewed: Did you receive and understand the discharge instructions provided?: Yes Medications obtained and verified?: Yes (Medications Reviewed) Any new allergies since your discharge?: No Dietary orders reviewed?: No Do you have support at home?: Yes People in Home: spouse  Home Care and Equipment/Supplies: [place] Ordered?: No Any new equipment or medical supplies ordered?: No  Functional Questionnaire: Do you need assistance with bathing/showering or dressing?: No Do you need assistance with meal preparation?: No Do you need assistance with eating?: No Do you have difficulty maintaining continence: No Do you need assistance with getting out of bed/getting out of a chair/moving?: No Do you have difficulty managing or taking your medications?: No  Folllow up appointments reviewed: PCP Follow-up appointment confirmed?: Yes Date of PCP follow-up appointment?: 09/21/22 Do you need transportation to your follow-up appointment?: No Do you understand care options if your condition(s) worsen?: Yes-patient verbalized understanding  SDOH Interventions Today    Flowsheet Row Most Recent Value  SDOH Interventions   Food Insecurity Interventions Intervention Not Indicated  Housing Interventions Intervention Not Indicated  Transportation Interventions Intervention Not Indicated  Utilities Interventions Intervention  Not Indicated       [place]

## 2022-08-27 ENCOUNTER — Other Ambulatory Visit: Payer: Self-pay | Admitting: Family Medicine

## 2022-08-27 DIAGNOSIS — J45909 Unspecified asthma, uncomplicated: Secondary | ICD-10-CM

## 2022-08-29 NOTE — Telephone Encounter (Signed)
Requested Prescriptions  Refused Prescriptions Disp Refills   ipratropium-albuterol (DUONEB) 0.5-2.5 (3) MG/3ML SOLN [Pharmacy Med Name: IPRAT-ALBUT 0.5-3(2.5) MG/3 ML] 360 mL 3    Sig: INHALE 3 MLS INTO THE LUNGS EVERY 6 (SIX) HOURS AS NEEDED. REPORTED ON 12/28/2015     Pulmonology:  Combination Products - albuterol / ipratropium Passed - 08/27/2022  5:20 PM      Passed - Last BP in normal range    BP Readings from Last 1 Encounters:  08/22/22 120/60         Passed - Last Heart Rate in normal range    Pulse Readings from Last 1 Encounters:  08/22/22 90         Passed - Valid encounter within last 12 months    Recent Outpatient Visits           2 months ago Chronic fatigue   [place], [place], [place]   2 months ago Recurrent sinusitis   [facility] [place], PA-C   2 months ago Patellar tendinitis of right knee   [facility] Birdie Sons, MD   4 months ago Morbid obesity [facility])   [place], Donald E, MD   5 months ago Chronic fatigue   [place], Donald E, MD       Future Appointments             In 3 weeks Fisher, Kirstie Peri, MD [facility], [facility]

## 2022-09-07 ENCOUNTER — Ambulatory Visit (INDEPENDENT_AMBULATORY_CARE_PROVIDER_SITE_OTHER): Payer: BC Managed Care – PPO

## 2022-09-07 DIAGNOSIS — R5382 Chronic fatigue, unspecified: Secondary | ICD-10-CM

## 2022-09-07 MED ORDER — CYANOCOBALAMIN 1000 MCG/ML IJ SOLN
1000.0000 ug | Freq: Once | INTRAMUSCULAR | Status: AC
  Administered 2022-09-07: 1000 ug via INTRAMUSCULAR

## 2022-09-13 ENCOUNTER — Other Ambulatory Visit: Payer: Self-pay

## 2022-09-13 ENCOUNTER — Emergency Department
Admission: EM | Admit: 2022-09-13 | Discharge: 2022-09-13 | Disposition: A | Discharge status: Home / Self Care | Payer: BC Managed Care – PPO | Attending: Student in an Organized Health Care Education/Training Program | Admitting: Student in an Organized Health Care Education/Training Program

## 2022-09-13 DIAGNOSIS — R110 Nausea: Secondary | ICD-10-CM | POA: Insufficient documentation

## 2022-09-13 DIAGNOSIS — R197 Diarrhea, unspecified: Secondary | ICD-10-CM | POA: Insufficient documentation

## 2022-09-13 DIAGNOSIS — R109 Unspecified abdominal pain: Secondary | ICD-10-CM | POA: Insufficient documentation

## 2022-09-13 LAB — CBC WITH DIFFERENTIAL/PLATELET
Abs Immature Granulocytes: 0.01 10*3/uL (ref 0.00–0.07)
Basophils Absolute: 0 10*3/uL (ref 0.0–0.1)
Basophils Relative: 1 %
Eosinophils Absolute: 0.2 10*3/uL (ref 0.0–0.5)
Eosinophils Relative: 2 %
HCT: 39.9 % (ref 36.0–46.0)
Hemoglobin: 12 g/dL (ref 12.0–15.0)
Immature Granulocytes: 0 %
Lymphocytes Relative: 38 %
Lymphs Abs: 2.4 10*3/uL (ref 0.7–4.0)
MCH: 26.5 pg (ref 26.0–34.0)
MCHC: 30.1 g/dL (ref 30.0–36.0)
MCV: 88.3 fL (ref 80.0–100.0)
Monocytes Absolute: 0.5 10*3/uL (ref 0.1–1.0)
Monocytes Relative: 8 %
Neutro Abs: 3.4 10*3/uL (ref 1.7–7.7)
Neutrophils Relative %: 51 %
Platelets: 333 10*3/uL (ref 150–400)
RBC: 4.52 MIL/uL (ref 3.87–5.11)
RDW: 13.1 % (ref 11.5–15.5)
WBC: 6.5 10*3/uL (ref 4.0–10.5)
nRBC: 0 % (ref 0.0–0.2)

## 2022-09-13 LAB — COMPREHENSIVE METABOLIC PANEL
ALT: 39 U/L (ref 0–44)
AST: 33 U/L (ref 15–41)
Albumin: 4.2 g/dL (ref 3.5–5.0)
Alkaline Phosphatase: 71 U/L (ref 38–126)
Anion gap: 9 (ref 5–15)
BUN: 8 mg/dL (ref 6–20)
CO2: 24 mmol/L (ref 22–32)
Calcium: 8.9 mg/dL (ref 8.9–10.3)
Chloride: 105 mmol/L (ref 98–111)
Creatinine, Ser: 0.57 mg/dL (ref 0.44–1.00)
GFR, Estimated: >60 mL/min (ref >60)
Glucose, Bld: 115 mg/dL — ABNORMAL HIGH (ref 70–99)
Potassium: 3.7 mmol/L (ref 3.5–5.1)
Sodium: 138 mmol/L (ref 135–145)
Total Bilirubin: 0.5 mg/dL (ref 0.3–1.2)
Total Protein: 8.1 g/dL (ref 6.5–8.1)

## 2022-09-13 MED ORDER — ONDANSETRON 4 MG PO TBDP
4.0000 mg | ORAL_TABLET | Freq: Once | ORAL | Status: AC
  Administered 2022-09-13: 4 mg via ORAL
  Filled 2022-09-13: qty 1

## 2022-09-13 MED ORDER — ONDANSETRON 4 MG PO TBDP: 4.0000 mg | ORAL_TABLET | Freq: Three times a day (TID) | ORAL | 0 refills | Status: DC | PRN

## 2022-09-13 NOTE — ED Provider Notes (Signed)
[facility] Provider Note    Event Date/Time   First MD Initiated Contact with Patient 09/13/22 1159     (approximate)   History   Diarrhea   HPI  Erin Sherman is a 57 y.o. female to ER for several days of nonbloody nonmelanotic watery brown diarrhea.  States she is having some nausea with this and crampy discomfort but denies any pain right now.  No fevers or chills.  Uncertain as to what may have provoked this.  She has not been on any antibiotics.  Tried Imodium x 1 without much improvement.     Physical Exam   Triage Vital Signs: ED Triage Vitals  Enc Vitals Group     BP 09/13/22 1119 117/76     Pulse Rate 09/13/22 1119 94     Resp 09/13/22 1119 18     Temp 09/13/22 1119 98.9 F (37.2 C)     Temp Source 09/13/22 1119 Oral     SpO2 09/13/22 1119 96 %     Weight 09/13/22 1125 279 lb 15.8 oz (127 kg)     Height 09/13/22 1125 5' (1.524 m)     Head Circumference --      Peak Flow --      Pain Score 09/13/22 1125 8     Pain Loc --      Pain Edu? --      Excl. in [place]? --     Most recent vital signs: Vitals:   09/13/22 1119  BP: 117/76  Pulse: 94  Resp: 18  Temp: 98.9 F (37.2 C)  SpO2: 96%     Constitutional: Alert  Eyes: Conjunctivae are normal.  Head: Atraumatic. Nose: No congestion/rhinnorhea. Mouth/Throat: Mucous membranes are moist.   Neck: Painless ROM.  Cardiovascular:   Good peripheral circulation. Respiratory: Normal respiratory effort.  No retractions.  Gastrointestinal: Soft and nontender.  Musculoskeletal:  no deformity Neurologic:  MAE spontaneously. No gross focal neurologic deficits are appreciated.  Skin:  Skin is warm, dry and intact. No rash noted. Psychiatric: Mood and affect are normal. Speech and behavior are normal.    ED Results / Procedures / Treatments   Labs (all labs ordered are listed, but only abnormal results are displayed) Labs Reviewed  COMPREHENSIVE METABOLIC PANEL - Abnormal;  Notable for the following components:      Result Value   Glucose, Bld 115 (*)    All other components within normal limits  CBC WITH DIFFERENTIAL/PLATELET     EKG     RADIOLOGY    PROCEDURES:  Critical Care performed:   Procedures   MEDICATIONS ORDERED IN ED: Medications  ondansetron (ZOFRAN-ODT) disintegrating tablet 4 mg (4 mg Oral Given 09/13/22 1226)     IMPRESSION / MDM / [place] / ED COURSE  I reviewed the triage vital signs and the nursing notes.                              Differential diagnosis includes, but is not limited to, enteritis, gastritis, foodborne illness, C. difficile, dehydration, electrolyte abnormality, colitis, diverticulitis   Patient presenting to the ER for evaluation of symptoms as described above.  Based on symptoms, risk factors and considered above differential, this presenting complaint could reflect a potentially life-threatening illness therefore the patient will be placed on continuous pulse oximetry and telemetry for monitoring.  Laboratory evaluation will be sent to evaluate for the above complaints.  Clinical Course as of 09/13/22 1315  Tue Sep 13, 2022  1314 Patient tolerating p.o. feels significant improvement after Zofran.  Repeat exam is benign.  No white count LFTs normal renal function normal no fever.  Do not feel that CT imaging clinically indicated given her symptoms discussed discussed supportive care and return precautions. [PR]    Clinical Course User Index [PR] Merlyn Lot, MD     FINAL CLINICAL IMPRESSION(S) / ED DIAGNOSES   Final diagnoses:  Nausea  Diarrhea, unspecified type     Rx / DC Orders   ED Discharge Orders          Ordered    ondansetron (ZOFRAN-ODT) 4 MG disintegrating tablet  Every 8 hours PRN,   Status:  Discontinued        09/13/22 1314    ondansetron (ZOFRAN-ODT) 4 MG disintegrating tablet  Every 8 hours PRN        09/13/22 1314             Note:  This  document was prepared using Dragon voice recognition software and may include unintentional dictation errors.    Merlyn Lot, MD 09/13/22 1315

## 2022-09-13 NOTE — ED Triage Notes (Signed)
Pt presents to the ED due to abdominal pain and emesis. Pt states it started Friday after work. Pt states after eating boiled chicken and noodles, "my stomach started bubbling, and I had to go to the bathroom". Pt A&Ox4. Pt NAD

## 2022-09-20 NOTE — Progress Notes (Unsigned)
I,Sha'taria Tyson,acting as a Education administrator for Lelon Huh, MD.,have documented all relevant documentation on the behalf of Lelon Huh, MD,as directed by  Lelon Huh, MD while in the presence of Lelon Huh, MD.      Established patient visit   Patient: Erin Sherman   DOB: 12-Apr-1966   57 y.o. Female  MRN: [id number] Visit Date: 09/21/2022  Today's healthcare provider: Lelon Huh, MD   No chief complaint on file.  Subjective    HPI  Abdominal Pain  The patient is a 56 year old female who presents for evaluation of abdominal pain and bloating.    Associated symptoms: {Yes/No:20286} anorexia  {Yes/No:20286} belching  {Yes/No:20286} bloody stool {Yes/No:20286} blood in urine   {Yes/No:20286} constipation {Yes/No:20286} diarrhea  {Yes/No:20286} dysuria {Yes/No:20286} fever  {Yes/No:20286} flatus {Yes/No:20286} headaches  {Yes/No:20286} headaches {Yes/No:20286} joint pains  {Yes/No:20286} myalgias {Yes/No:20286} nausea  {Yes/No:20286} vomiting {Yes/No:20286} weight loss     Recent GI studies:{GI studies:119307} {Relevant medical history includes:119311:::1}  Previous labs Lab Results  Component Value Date   WBC 6.5 09/13/2022   HGB 12.0 09/13/2022   HCT 39.9 09/13/2022   MCV 88.3 09/13/2022   MCH 26.5 09/13/2022   RDW 13.1 09/13/2022   PLT 333 09/13/2022   Lab Results  Component Value Date   GLUCOSE 115 (H) 09/13/2022   NA 138 09/13/2022   K 3.7 09/13/2022   CL 105 09/13/2022   CO2 24 09/13/2022   BUN 8 09/13/2022   CREATININE 0.57 09/13/2022   GFRNONAA >60 09/13/2022   GFRAA 119 02/28/2020   CALCIUM 8.9 09/13/2022   PROT 8.1 09/13/2022   ALBUMIN 4.2 09/13/2022   LABGLOB 3.0 02/28/2020   AGRATIO 1.6 02/28/2020   BILITOT 0.5 09/13/2022   ALKPHOS 71 09/13/2022   AST 33 09/13/2022   ALT 39 09/13/2022   ANIONGAP 9 09/13/2022   No results found for:  "AMYLASE" -----------------------------------------------------------------------------------------   Medications: Outpatient Medications Prior to Visit  Medication Sig   albuterol (VENTOLIN HFA) 108 (90 Base) MCG/ACT inhaler TAKE 2 PUFFS BY MOUTH EVERY 6 HOURS AS NEEDED   aspirin 81 MG EC tablet Take 1 tablet (81 mg total) by mouth daily. Swallow whole.   azelastine (ASTELIN) 0.1 % nasal spray Place 2 sprays into both nostrils 2 (two) times daily. Use in each nostril as directed   benzonatate (TESSALON PERLES) 100 MG capsule Take 1 capsule (100 mg total) by mouth 3 (three) times daily as needed for cough.   brompheniramine-pseudoephedrine-DM 30-2-10 MG/5ML syrup Take 10 mLs by mouth 4 (four) times daily as needed.   budesonide-formoterol (SYMBICORT) 160-4.5 MCG/ACT inhaler Inhale 2 puffs into the lungs 2 (two) times daily.   calcium carbonate (TUMS - DOSED IN MG ELEMENTAL CALCIUM) 500 MG chewable tablet Chew 1 tablet by mouth daily as needed for indigestion.   Cetirizine HCl (ZYRTEC PO) Take by mouth.   cholecalciferol (VITAMIN D) 1000 UNITS tablet Take 1,000 Units by mouth daily.   EPINEPHRINE 0.3 mg/0.3 mL IJ SOAJ injection INJECT 0.3 MLS (0.3 MG TOTAL) INTO THE MUSCLE AS NEEDED. WITH ALLERGIC REACTION   fluticasone (FLONASE) 50 MCG/ACT nasal spray Place 2 sprays into both nostrils daily.   ipratropium-albuterol (DUONEB) 0.5-2.5 (3) MG/3ML SOLN Inhale 3 mLs into the lungs every 6 (six) hours as needed. Reported on 12/28/2015   levalbuterol (XOPENEX HFA) 45 MCG/ACT inhaler Inhale 2 puffs into the lungs every 6 (six) hours as needed for wheezing. May substitute generic   montelukast (SINGULAIR) 10 MG tablet Take 1  tablet (10 mg total) by mouth at bedtime.   nabumetone (RELAFEN) 500 MG tablet Take 2 tablets (1,000 mg total) by mouth daily as needed.   ondansetron (ZOFRAN-ODT) 4 MG disintegrating tablet Take 1 tablet (4 mg total) by mouth every 8 (eight) hours as needed for nausea or vomiting.    oxyCODONE-acetaminophen (PERCOCET) 7.5-325 MG tablet Take one to 2 tablets every eight hours as needed for pain   phentermine 37.5 MG capsule Take 1 capsule (37.5 mg total) by mouth every morning. DO NOT TAKE THIS MEDICATION WHEN TAKING ZYRTEC-D   predniSONE (DELTASONE) 50 MG tablet Take 1 tablet (50 mg total) by mouth daily with breakfast.   promethazine (PHENERGAN) 25 MG tablet Take 1 tablet (25 mg total) by mouth every 8 (eight) hours as needed for nausea.   Semaglutide-Weight Management (WEGOVY) 0.25 MG/0.5ML SOAJ Inject 0.25mg  once a week for 4 weeks, then increase to 0.5mg  once a week   sodium chloride HYPERTONIC 3 % nebulizer solution Use one- 15 mL ampule, up to 4 times daily, as needed, via nebulizer for cough/chest tightness.   Spacer/Aero-Holding Chambers (OPTICHAMBER ADVANTAGE) MISC 1 each by Other route once. Always uses her when you're using a metered-dose inhaler. You've aromatase medicine as much, he won't have his much side effect, but you it twice as much medicine and your lungs.   Tiotropium Bromide Monohydrate (SPIRIVA RESPIMAT) 2.5 MCG/ACT AERS Inhale 2 puffs into the lungs daily.   triamcinolone cream (KENALOG) 0.5 % Apply 1 application. topically 3 (three) times daily.   No facility-administered medications prior to visit.    Review of Systems  {Labs  Heme  Chem  Endocrine  Serology  Results Review (optional):23779}   Objective    There were no vitals taken for this visit. {Show previous vital signs (optional):23777}  Physical Exam  ***  No results found for any visits on 09/21/22.  Assessment & Plan     ***  No follow-ups on file.      {provider attestation***:1}   Lelon Huh, MD  [place] [phone] (phone) [phone] (fax)  [place]

## 2022-09-21 ENCOUNTER — Encounter: Payer: Self-pay | Admitting: Family Medicine

## 2022-09-21 ENCOUNTER — Ambulatory Visit: Payer: BC Managed Care – PPO | Admitting: Family Medicine

## 2022-09-21 VITALS — BP 134/75 | HR 89 | Wt 289.5 lb

## 2022-09-21 DIAGNOSIS — R140 Abdominal distension (gaseous): Secondary | ICD-10-CM

## 2022-09-21 DIAGNOSIS — M5136 Other intervertebral disc degeneration, lumbar region: Secondary | ICD-10-CM | POA: Diagnosis not present

## 2022-09-21 DIAGNOSIS — R21 Rash and other nonspecific skin eruption: Secondary | ICD-10-CM

## 2022-09-21 DIAGNOSIS — M5416 Radiculopathy, lumbar region: Secondary | ICD-10-CM | POA: Diagnosis not present

## 2022-09-21 DIAGNOSIS — G8929 Other chronic pain: Secondary | ICD-10-CM

## 2022-09-21 DIAGNOSIS — J4540 Moderate persistent asthma, uncomplicated: Secondary | ICD-10-CM

## 2022-09-21 DIAGNOSIS — M545 Low back pain, unspecified: Secondary | ICD-10-CM

## 2022-09-21 DIAGNOSIS — L282 Other prurigo: Secondary | ICD-10-CM

## 2022-09-21 MED ORDER — OXYCODONE-ACETAMINOPHEN 7.5-325 MG PO TABS: ORAL_TABLET | ORAL | 0 refills | Status: DC

## 2022-09-21 MED ORDER — ONDANSETRON 4 MG PO TBDP: 4.0000 mg | ORAL_TABLET | Freq: Three times a day (TID) | ORAL | 0 refills | Status: DC | PRN

## 2022-09-21 MED ORDER — MONTELUKAST SODIUM 10 MG PO TABS: 10.0000 mg | ORAL_TABLET | Freq: Every day | ORAL | 3 refills | Status: DC

## 2022-09-21 MED ORDER — TRIAMCINOLONE ACETONIDE 0.5 % EX CREA: 1.0000 | TOPICAL_CREAM | Freq: Three times a day (TID) | CUTANEOUS | 2 refills | Status: DC

## 2022-09-21 MED ORDER — PHENTERMINE HCL 37.5 MG PO CAPS: 37.5000 mg | ORAL_CAPSULE | ORAL | 4 refills | Status: DC

## 2022-09-21 NOTE — Progress Notes (Signed)
B12 injection only. No visit

## 2022-09-21 NOTE — Patient Instructions (Signed)
Please review the attached list of medications and notify my office if there are any errors.   Try OTC Align (probiotic) for 2-3 weeks. If not much better in 3 weeks then let me know and we order an ultrasound

## 2022-10-05 ENCOUNTER — Ambulatory Visit: Payer: BC Managed Care – PPO

## 2022-10-10 ENCOUNTER — Ambulatory Visit: Payer: Self-pay

## 2022-10-10 ENCOUNTER — Other Ambulatory Visit: Payer: Self-pay

## 2022-10-10 DIAGNOSIS — J4541 Moderate persistent asthma with (acute) exacerbation: Secondary | ICD-10-CM

## 2022-10-10 MED ORDER — SPIRIVA RESPIMAT 2.5 MCG/ACT IN AERS: 2.0000 | INHALATION_SPRAY | Freq: Every day | RESPIRATORY_TRACT | 12 refills | Status: DC

## 2022-10-10 NOTE — Telephone Encounter (Signed)
Pt returned my call. Needed to ensure that pt was not having a worsening of SOB/wheezing.  Pt also gave name of refill needed "Spiriva".

## 2022-10-10 NOTE — Telephone Encounter (Signed)
Patient called and says she's returning a call from [facility] to answer additional questions, call connected to [place], RN successfully.

## 2022-10-10 NOTE — Telephone Encounter (Signed)
  Chief Complaint: Allergies - difficulty breathing Symptoms: Difficulty breathing Frequency: Ongoing Pertinent Negatives: Patient denies  Disposition: [] ED /[] Urgent Care (no appt availability in office) / [] Appointment(In office/virtual)/ []  [place] [facility]/ [] Home Care/ [] Refused Recommended Disposition /[] [place] [facility]/ []  Follow-up with PCP Additional Notes: Pt states that allergies are really bad right now. PT is out of one of her inhaler medications. Pt will call back to tell us which one it is so we can reorder.  Pt would like steroids for allergies.  Pt took last appt available for tomorrow.    Called pt back to verify if pt is wheezing. Requested return call.  Reason for Disposition  [1] MODERATE longstanding difficulty breathing (e.g., speaks in phrases, SOB even at rest, pulse 100-120) AND [2] SAME as normal  Answer Assessment - Initial Assessment Questions 1. RESPIRATORY STATUS: "Describe your breathing?" (e.g., wheezing, shortness of breath, unable to speak, severe coughing)      Coughing, shortness 2. ONSET: "When did this breathing problem begin?"      Friday 3. PATTERN "Does the difficult breathing come and go, or has it been constant since it started?"      Constant 4. SEVERITY: "How bad is your breathing?" (e.g., mild, moderate, severe)    - MILD: No SOB at rest, mild SOB with walking, speaks normally in sentences, can lie down, no retractions, pulse < 100.    - MODERATE: SOB at rest, SOB with minimal exertion and prefers to sit, cannot lie down flat, speaks in phrases, mild retractions, audible wheezing, pulse 100-120.    - SEVERE: Very SOB at rest, speaks in single words, struggling to breathe, sitting hunched forward, retractions, pulse > 120      mild moderate 5. CURRENT SYMPTOM: "Have you had difficulty breathing before?" If Yes, ask: "When was the last time?" and "What happened that time?"      Yes - allergies 6. CARDIAC HISTORY: "Do you have  any history of heart disease?" (e.g., heart attack, angina, bypass surgery, angioplasty)      no 7. LUNG HISTORY: "Do you have any history of lung disease?"  (e.g., pulmonary embolus, asthma, emphysema)     Asthma, bronchitis, allergies 8. CAUSE: "What do you think is causing the breathing problem?"      Allergies 9. OTHER SYMPTOMS: "Do you have any other symptoms? (e.g., dizziness, runny nose, cough, chest pain, fever)     No stuffy nose  Protocols used: Breathing Difficulty-A-AH

## 2022-10-10 NOTE — Addendum Note (Signed)
Addended by: Malva Limes on: 10/10/2022 05:20 PM   Modules accepted: Orders

## 2022-10-11 ENCOUNTER — Ambulatory Visit: Payer: BC Managed Care – PPO | Admitting: Physician Assistant

## 2022-10-11 NOTE — Progress Notes (Deleted)
Established patient visit   Patient: Erin Sherman   DOB: 10-Oct-1965   57 y.o. Female  MRN: [id number] Visit Date: 10/11/2022  Today's healthcare provider: Debera Lat, PA-C   No chief complaint on file.  Subjective    HPI  Patient is a 57 year old female who presents for evaluation of allergies and difficulty breathing.  Medications: Outpatient Medications Prior to Visit  Medication Sig   albuterol (VENTOLIN HFA) 108 (90 Base) MCG/ACT inhaler TAKE 2 PUFFS BY MOUTH EVERY 6 HOURS AS NEEDED   aspirin 81 MG EC tablet Take 1 tablet (81 mg total) by mouth daily. Swallow whole.   azelastine (ASTELIN) 0.1 % nasal spray Place 2 sprays into both nostrils 2 (two) times daily. Use in each nostril as directed   benzonatate (TESSALON PERLES) 100 MG capsule Take 1 capsule (100 mg total) by mouth 3 (three) times daily as needed for cough.   brompheniramine-pseudoephedrine-DM 30-2-10 MG/5ML syrup Take 10 mLs by mouth 4 (four) times daily as needed.   budesonide-formoterol (SYMBICORT) 160-4.5 MCG/ACT inhaler Inhale 2 puffs into the lungs 2 (two) times daily.   calcium carbonate (TUMS - DOSED IN MG ELEMENTAL CALCIUM) 500 MG chewable tablet Chew 1 tablet by mouth daily as needed for indigestion.   Cetirizine HCl (ZYRTEC PO) Take by mouth.   cholecalciferol (VITAMIN D) 1000 UNITS tablet Take 1,000 Units by mouth daily.   EPINEPHRINE 0.3 mg/0.3 mL IJ SOAJ injection INJECT 0.3 MLS (0.3 MG TOTAL) INTO THE MUSCLE AS NEEDED. WITH ALLERGIC REACTION   fluticasone (FLONASE) 50 MCG/ACT nasal spray Place 2 sprays into both nostrils daily.   ipratropium-albuterol (DUONEB) 0.5-2.5 (3) MG/3ML SOLN Inhale 3 mLs into the lungs every 6 (six) hours as needed. Reported on 12/28/2015   levalbuterol (XOPENEX HFA) 45 MCG/ACT inhaler Inhale 2 puffs into the lungs every 6 (six) hours as needed for wheezing. May substitute generic   montelukast (SINGULAIR) 10 MG tablet Take 1 tablet (10 mg total) by mouth at  bedtime.   nabumetone (RELAFEN) 500 MG tablet Take 2 tablets (1,000 mg total) by mouth daily as needed.   ondansetron (ZOFRAN-ODT) 4 MG disintegrating tablet Take 1 tablet (4 mg total) by mouth every 8 (eight) hours as needed for nausea or vomiting.   oxyCODONE-acetaminophen (PERCOCET) 7.5-325 MG tablet Take one to 2 tablets every eight hours as needed for pain   phentermine 37.5 MG capsule Take 1 capsule (37.5 mg total) by mouth every morning. DO NOT TAKE THIS MEDICATION WHEN TAKING ZYRTEC-D   predniSONE (DELTASONE) 50 MG tablet Take 1 tablet (50 mg total) by mouth daily with breakfast.   promethazine (PHENERGAN) 25 MG tablet Take 1 tablet (25 mg total) by mouth every 8 (eight) hours as needed for nausea.   Semaglutide-Weight Management (WEGOVY) 0.25 MG/0.5ML SOAJ Inject 0.25mg  once a week for 4 weeks, then increase to 0.5mg  once a week (Patient not taking: Reported on 09/21/2022)   sodium chloride HYPERTONIC 3 % nebulizer solution Use one- 15 mL ampule, up to 4 times daily, as needed, via nebulizer for cough/chest tightness.   Spacer/Aero-Holding Chambers (OPTICHAMBER ADVANTAGE) MISC 1 each by Other route once. Always uses her when you're using a metered-dose inhaler. You've aromatase medicine as much, he won't have his much side effect, but you it twice as much medicine and your lungs.   Tiotropium Bromide Monohydrate (SPIRIVA RESPIMAT) 2.5 MCG/ACT AERS Inhale 2 puffs into the lungs daily.   triamcinolone cream (KENALOG) 0.5 % Apply 1  Application topically 3 (three) times daily.   No facility-administered medications prior to visit.    Review of Systems  {Labs  Heme  Chem  Endocrine  Serology  Results Review (optional):23779}   Objective    There were no vitals taken for this visit. {Show previous vital signs (optional):23777}  Physical Exam  ***  No results found for any visits on 10/11/22.  Assessment & Plan     ***  No follow-ups on file.      {provider  attestation***:1}   Debera Lat, PA-C  [facility] [phone] (phone) [phone] (fax)  [facility]

## 2022-10-28 ENCOUNTER — Other Ambulatory Visit: Payer: Self-pay | Admitting: Physician Assistant

## 2022-10-28 ENCOUNTER — Ambulatory Visit: Payer: Self-pay

## 2022-10-28 ENCOUNTER — Other Ambulatory Visit: Payer: Self-pay | Admitting: Family Medicine

## 2022-10-28 DIAGNOSIS — J4541 Moderate persistent asthma with (acute) exacerbation: Secondary | ICD-10-CM

## 2022-10-28 DIAGNOSIS — J45909 Unspecified asthma, uncomplicated: Secondary | ICD-10-CM

## 2022-10-28 DIAGNOSIS — J329 Chronic sinusitis, unspecified: Secondary | ICD-10-CM

## 2022-10-28 NOTE — Telephone Encounter (Signed)
Requested Prescriptions  Pending Prescriptions Disp Refills   fluticasone (FLONASE) 50 MCG/ACT nasal spray [Pharmacy Med Name: FLUTICASONE PROP 50 MCG SPRAY] 48 mL 1    Sig: SPRAY 2 SPRAYS INTO EACH NOSTRIL EVERY DAY     Ear, Nose, and Throat: Nasal Preparations - Corticosteroids Passed - 10/28/2022  3:40 PM      Passed - Valid encounter within last 12 months    Recent Outpatient Visits           1 month ago Abdominal bloating   [place] [facility] Malva Limes, MD   4 months ago Chronic fatigue   [place] [facility] [place], [place], CMA   4 months ago Recurrent sinusitis   [place] [facility] [place], [place], PA-C   4 months ago Patellar tendinitis of right knee   [facility] Malva Limes, MD   6 months ago Morbid obesity [facility])   [facility] Malva Limes, MD

## 2022-10-28 NOTE — Telephone Encounter (Signed)
Summary: cough / rx req   The patient shares that they've had an ongoing cough that continues to cause them discomfort  The patient shares that they have also began to experience soreness as a result of the cough  The patient has been prescribed medication which has been ineffective for the most part  Please contact when possible     Called pt - left message on machine to return our call.

## 2022-10-28 NOTE — Telephone Encounter (Signed)
  Chief Complaint: Lingering cough Symptoms:  Frequency: 3 weeks Pertinent Negatives: Patient denies fever Disposition: [] ED /[] Urgent Care (no appt availability in office) / [] Appointment(In office/virtual)/ []  [place] [facility]/ [] Home Care/ [x] Refused Recommended Disposition /[] [place] [facility]/ []  Follow-up with PCP Additional Notes: PT has had a cough since recovering from COVID. Pt states that cough is causing her fatigue. Pt wanted to be seen this afternoon. Appts that are available pt is unable to take d/t work. PT refused Monday appt. PT states she will go to UC.    Summary: cough / rx req   The patient shares that they've had an ongoing cough that continues to cause them discomfort  The patient shares that they have also began to experience soreness as a result of the cough  The patient has been prescribed medication which has been ineffective for the most part  Please contact when possible     Reason for Disposition  Cough has been present for > 3 weeks  Answer Assessment - Initial Assessment Questions 1. ONSET: "When did the cough begin?"      3 weeks ago 2. SEVERITY: "How bad is the cough today?"      Moderate 3. SPUTUM: "Describe the color of your sputum" (none, dry cough; clear, white, yellow, green)      4. HEMOPTYSIS: "Are you coughing up any blood?" If so ask: "How much?" (flecks, streaks, tablespoons, etc.)      5. DIFFICULTY BREATHING: "Are you having difficulty breathing?" If Yes, ask: "How bad is it?" (e.g., mild, moderate, severe)    - MILD: No SOB at rest, mild SOB with walking, speaks normally in sentences, can lie down, no retractions, pulse < 100.    - MODERATE: SOB at rest, SOB with minimal exertion and prefers to sit, cannot lie down flat, speaks in phrases, mild retractions, audible wheezing, pulse 100-120.    - SEVERE: Very SOB at rest, speaks in single words, struggling to breathe, sitting hunched forward, retractions, pulse > 120       Mild  - moderate 6. FEVER: "Do you have a fever?" If Yes, ask: "What is your temperature, how was it measured, and when did it start?"     no 7. CARDIAC HISTORY: "Do you have any history of heart disease?" (e.g., heart attack, congestive heart failure)       8. LUNG HISTORY: "Do you have any history of lung disease?"  (e.g., pulmonary embolus, asthma, emphysema)     Asthma and bronchitis 10. OTHER SYMPTOMS: "Do you have any other symptoms?" (e.g., runny nose, wheezing, chest pain)       Recently recovered from COVID  Protocols used: Cough - Acute Non-Productive-A-AH

## 2022-10-28 NOTE — Telephone Encounter (Signed)
Rx was dc'd by another provider.   Requested Prescriptions  Pending Prescriptions Disp Refills   ipratropium-albuterol (DUONEB) 0.5-2.5 (3) MG/3ML SOLN [Pharmacy Med Name: IPRAT-ALBUT 0.5-3(2.5) MG/3 ML] 360 mL 3    Sig: INHALE 3 MLS INTO THE LUNGS EVERY 6 (SIX) HOURS AS NEEDED. REPORTED ON 12/28/2015     Pulmonology:  Combination Products - albuterol / ipratropium Passed - 10/28/2022  3:40 PM      Passed - Last BP in normal range    BP Readings from Last 1 Encounters:  09/21/22 134/75         Passed - Last Heart Rate in normal range    Pulse Readings from Last 1 Encounters:  09/21/22 89         Passed - Valid encounter within last 12 months    Recent Outpatient Visits           1 month ago Abdominal bloating   [place] [facility] Malva Limes, MD   4 months ago Chronic fatigue   [facility] [place], [place], CMA   4 months ago Recurrent sinusitis   [place] [facility] [place], [place], PA-C   4 months ago Patellar tendinitis of right knee   [facility] Malva Limes, MD   6 months ago Morbid obesity [facility])   [place] Malva Limes, MD              Refused Prescriptions Disp Refills   benzonatate (TESSALON) 200 MG capsule [Pharmacy Med Name: BENZONATATE 200 MG CAPSULE] 90 capsule 0    Sig: TAKE 1 CAPSULE (200 MG TOTAL) BY MOUTH 3 (THREE) TIMES DAILY AS NEEDED FOR COUGH.     Ear, Nose, and Throat:  Antitussives/Expectorants Passed - 10/28/2022  3:40 PM      Passed - Valid encounter within last 12 months    Recent Outpatient Visits           1 month ago Abdominal bloating   [facility] Malva Limes, MD   4 months ago Chronic fatigue   [place] [facility] [place], [place], CMA   4 months ago Recurrent sinusitis   [place] [facility] [place], [place], PA-C   4 months ago  Patellar tendinitis of right knee   [facility] Malva Limes, MD   6 months ago Morbid obesity [facility])    [facility] Malva Limes, MD

## 2022-11-01 ENCOUNTER — Telehealth: Payer: Self-pay | Admitting: Family Medicine

## 2022-11-01 NOTE — Telephone Encounter (Signed)
  FMLA paperwork was received on 11/01/2022 & placed in provider's mailbox. Please allow 7-10 business days to be completed. Thank you

## 2022-11-15 ENCOUNTER — Telehealth: Payer: Self-pay | Admitting: Family Medicine

## 2022-11-15 ENCOUNTER — Telehealth: Payer: Self-pay

## 2022-11-15 IMAGING — MR MR HEAD W/O CM
12 series · 48 of 48 positions shown · non-contrast
Comparison: CT from 01/17/2021.

CLINICAL DATA: Initial evaluation for acute TIA.

EXAM:
MRI HEAD WITHOUT CONTRAST
TECHNIQUE: Multiplanar, multiecho pulse sequences of the brain and surrounding
structures were obtained without intravenous contrast.

[Series 5: ax dwi_tracew · axial · 3.0mm · 0.65mm/px · z∈[-141,-1]mm · 2 of 44 slices shown]
[im 1/44]
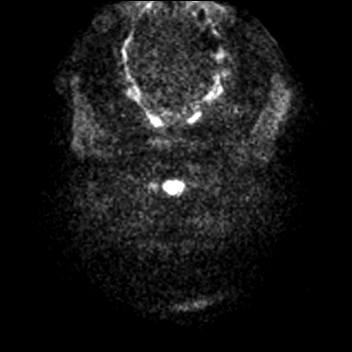
[im 44/44]
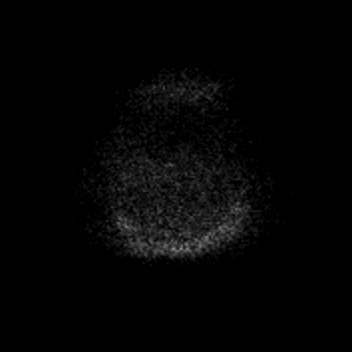

[Series 6: ax dwi_adc · axial · 3.0mm · 0.65mm/px · z∈[-141,-5]mm · 3 of 42 slices shown]
[im 1/42]
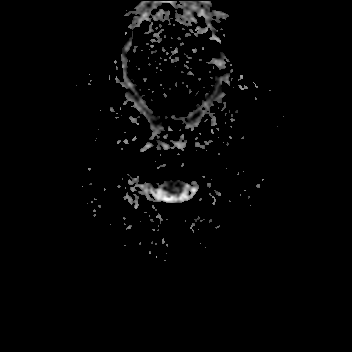
[im 21/42]
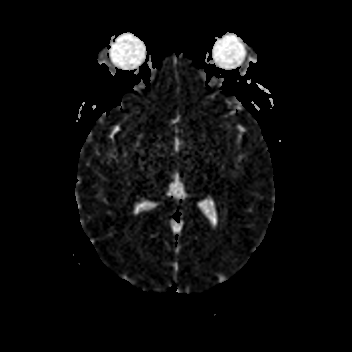
[im 42/42]
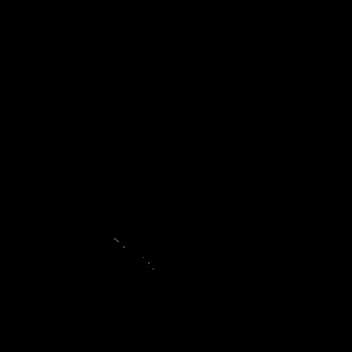

[Series 7: cor dwi_tracew · coronal · 5.0mm · 0.60mm/px · 3 of 34 slices shown]
[im 1/34]
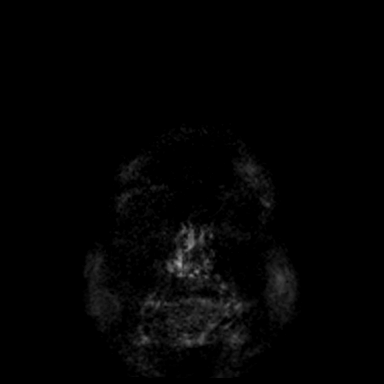
[im 17/34]
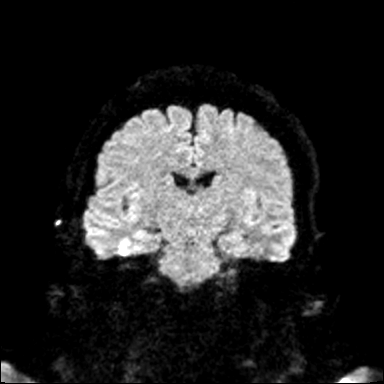
[im 34/34]
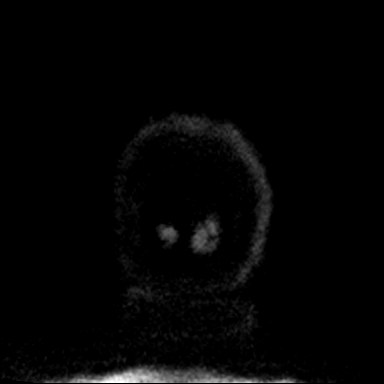

[Series 8: cor dwi_adc · coronal · 5.0mm · 0.60mm/px · 2 of 32 slices shown]
[im 1/32]
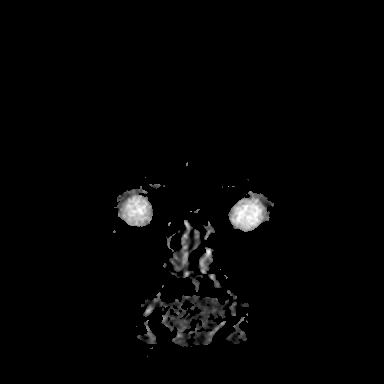
[im 32/32]
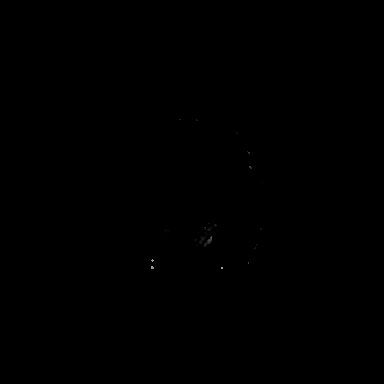

[Series 9: T1 · sagittal · 5.0mm · 0.62mm/px · 2 of 22 slices shown (1 of 2)]
[im 1/22]
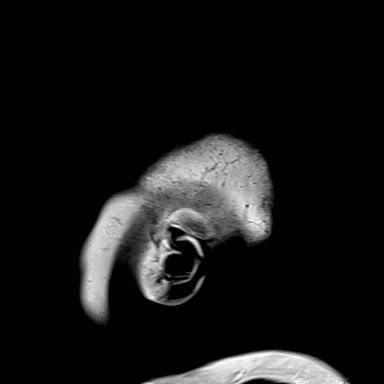
[im 22/22]
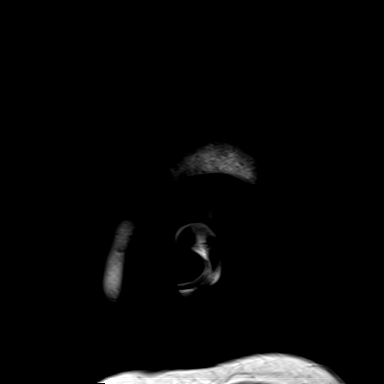

[Series 10: T2 · axial · 5.0mm · 0.53mm/px · z∈[-141,+1]mm · 2 of 25 slices shown (1 of 2)]
[im 1/25]
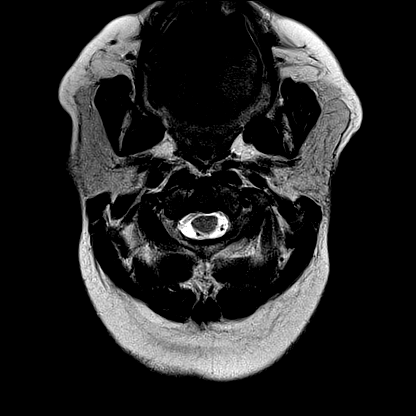
[im 25/25]
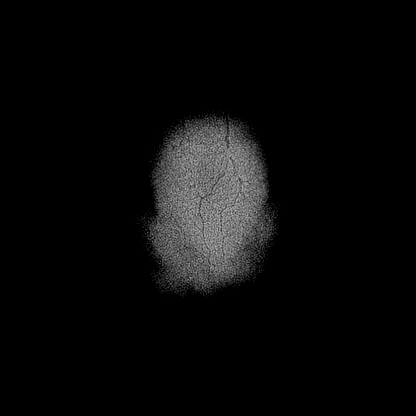

[Series 11: mag_images · axial · 3.0mm · 0.90mm/px · z∈[-158,+16]mm · 5 of 60 slices shown]
[im 1/60]
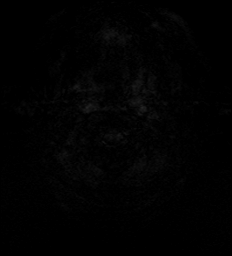
[im 15/60]
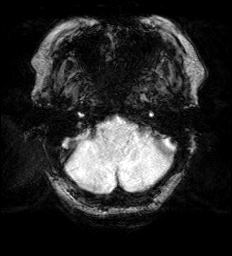
[im 30/60]
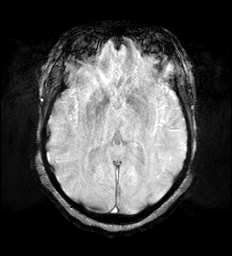
[im 45/60]
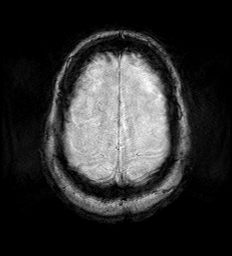
[im 60/60]
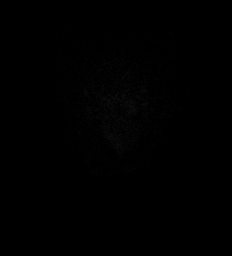

[Series 12: pha_images · axial · 3.0mm · 0.90mm/px · z∈[-158,+16]mm · 5 of 60 slices shown]
[im 1/60]
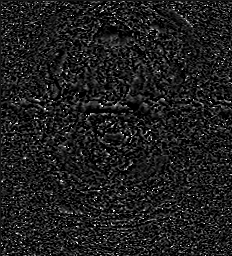
[im 15/60]
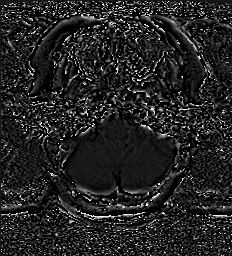
[im 30/60]
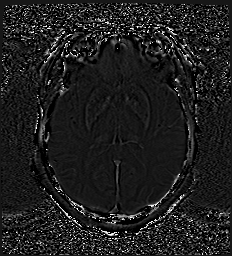
[im 45/60]
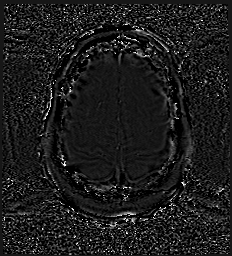
[im 60/60]
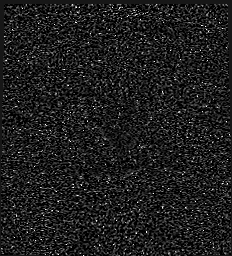

[Series 13: swi_images · axial · 3.0mm · 0.90mm/px · z∈[-158,+16]mm · 5 of 60 slices shown]
[im 1/60]
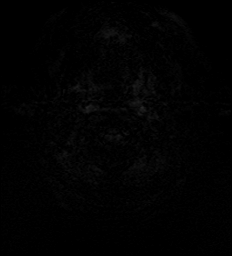
[im 15/60]
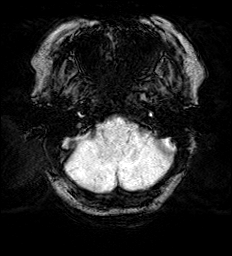
[im 30/60]
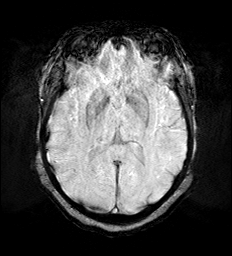
[im 45/60]
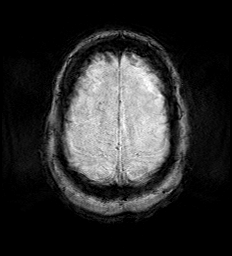
[im 60/60]
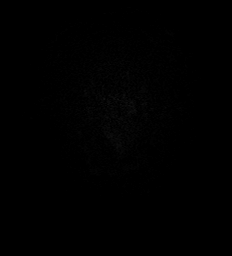

[Series 15: FLAIR · axial · 3.0mm · 0.53mm/px · z∈[-150,+10]mm · 4 of 55 slices shown]
[im 1/55]
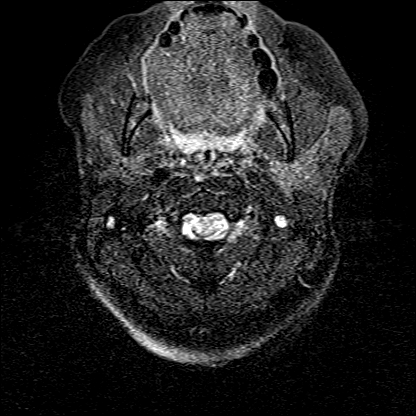
[im 19/55]
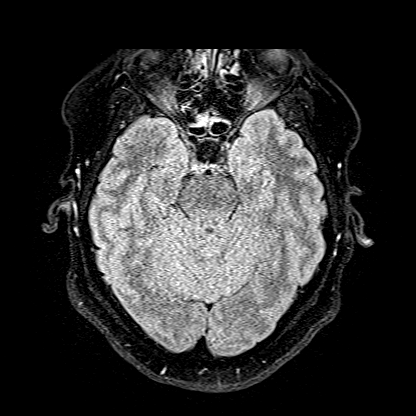
[im 37/55]
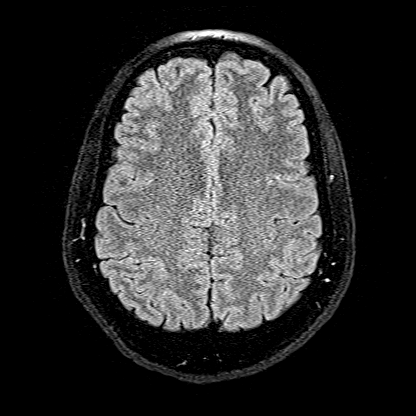
[im 55/55]
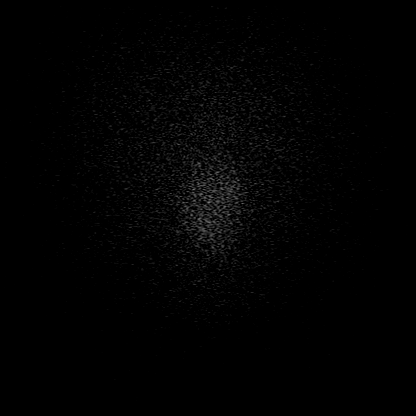

[Series 16: T1 · axial · 1.0mm · 0.98mm/px · z∈[-159,+13]mm · 13 of 176 slices shown (2 of 2)]
[im 1/176]
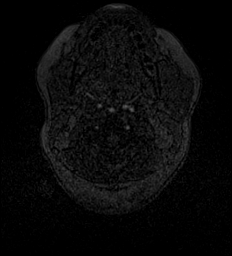
[im 15/176]
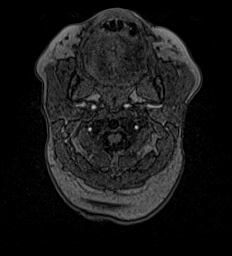
[im 30/176]
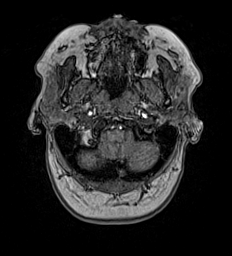
[im 44/176]
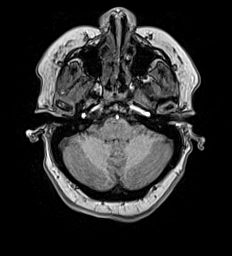
[im 59/176]
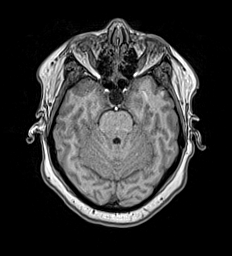
[im 73/176]
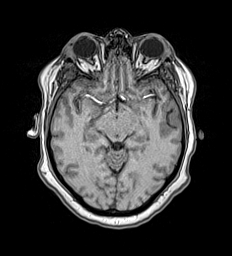
[im 88/176]
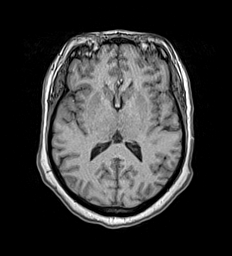
[im 103/176]
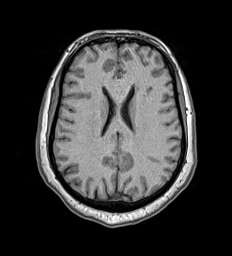
[im 117/176]
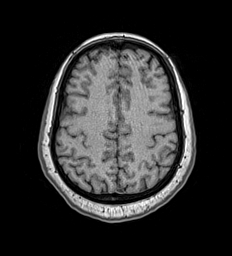
[im 132/176]
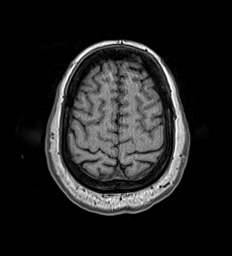
[im 146/176]
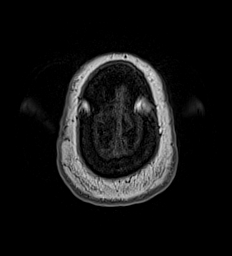
[im 161/176]
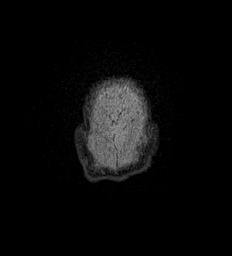
[im 176/176]
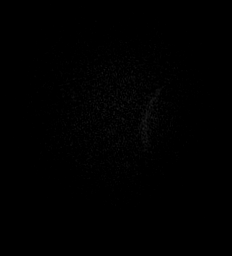

[Series 17: T2 · coronal · 5.0mm · 0.60mm/px · 2 of 27 slices shown (2 of 2)]
[im 1/27]
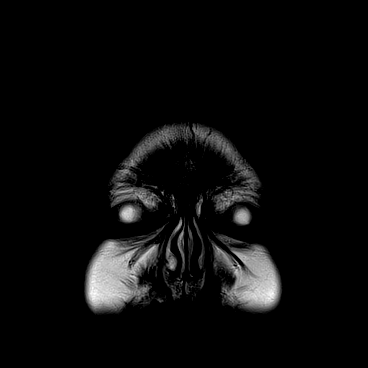
[im 27/27]
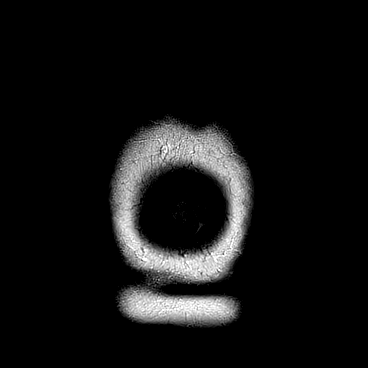

[48 of 48 positions shown; findings below may reference images not displayed]

FINDINGS: Brain: Cerebral volume within normal limits. No significant cerebral
white matter disease or other focal parenchymal signal abnormality.
No convincing abnormal foci of restricted diffusion to suggest acute
or subacute ischemia. Gray-white matter differentiation maintained.
No encephalomalacia to suggest chronic cortical infarction. No
evidence for acute or chronic intracranial hemorrhage.

No mass lesion, midline shift or mass effect. No hydrocephalus or
extra-axial fluid collection. Empty sella noted. Midline structures
intact.

Vascular: Major intracranial vascular flow voids are maintained.

Skull and upper cervical spine: Cerebellar tonsils extend up to 6 mm
below the foramen magnum, consistent with Chiari 1 malformation. No
visible syrinx within the upper cervical spinal cord. Bone marrow
signal intensity within normal limits. No scalp soft tissue
abnormality.

Sinuses/Orbits: Patient status post bilateral ocular lens
replacement. Mild scattered mucosal thickening noted throughout the
paranasal sinuses. No mastoid effusion. Inner ear structures grossly
normal.

Other: None.
IMPRESSION: 1. No acute intracranial abnormality.
2. Chiari 1 malformation.
3. Empty sella. While this finding is often incidental in nature and
of no clinical significance, this can also be seen in the setting of
idiopathic intracranial hypertension.

## 2022-11-15 MED ORDER — ZEPBOUND 2.5 MG/0.5ML ~~LOC~~ SOAJ: 2.5000 mg | SUBCUTANEOUS | 0 refills | Status: DC

## 2022-11-15 NOTE — Telephone Encounter (Signed)
Copied from CRM [id number]. Topic: General - Other >> Nov 15, 2022 10:02 AM Epimenio Foot F wrote: Reason for CRM: Pt is calling in because she received an email saying her FMLA paperwork had not been filled out from the provider. Pt says she needs this paperwork filled out immediately and sent back via fax. Pt says the paperwork was sent over to Dr. Sherrie Mustache but it was never completed. Pt is asking that it be back dated to April. Pt says this is an urgent matter. The FMLA paperwork can be faxed to - [phone] or [phone] . Pt would like a follow up call when this is completed.

## 2022-11-15 NOTE — Telephone Encounter (Signed)
This was already done and sent to medical records to be faxed a few days ago

## 2022-11-15 NOTE — Telephone Encounter (Signed)
Pt is calling in because Dr. Sherrie Mustache prescribed Reginal Lutes to the pt but pt says her insurance will no longer cover it and it is $800 even with a discount. Pt says her insurance will cover Moderna. Pt is asking for a prescription of Moderna instead.

## 2022-11-16 NOTE — Telephone Encounter (Signed)
FMLA was received and placed in provider's mailbox on 11/01/2022.Marland KitchenFMLA paperwork was completed on 11/14/2022 and was just placed in the medical records box on 11/16/2022 to be faxed. Paperwork faxed to [facility] on 11/16/2022. Patient was informed & aware of fee.

## 2022-11-18 ENCOUNTER — Ambulatory Visit: Payer: BC Managed Care – PPO | Admitting: Family Medicine

## 2022-11-18 ENCOUNTER — Encounter: Payer: Self-pay | Admitting: Family Medicine

## 2022-11-18 VITALS — BP 114/65 | HR 90 | Temp 99.5°F | Resp 16 | Wt 290.0 lb

## 2022-11-18 DIAGNOSIS — M5136 Other intervertebral disc degeneration, lumbar region: Secondary | ICD-10-CM | POA: Diagnosis not present

## 2022-11-18 DIAGNOSIS — M545 Low back pain, unspecified: Secondary | ICD-10-CM

## 2022-11-18 DIAGNOSIS — R053 Chronic cough: Secondary | ICD-10-CM | POA: Diagnosis not present

## 2022-11-18 DIAGNOSIS — J45909 Unspecified asthma, uncomplicated: Secondary | ICD-10-CM | POA: Diagnosis not present

## 2022-11-18 DIAGNOSIS — M5416 Radiculopathy, lumbar region: Secondary | ICD-10-CM | POA: Diagnosis not present

## 2022-11-18 DIAGNOSIS — G8929 Other chronic pain: Secondary | ICD-10-CM

## 2022-11-18 MED ORDER — OXYCODONE-ACETAMINOPHEN 7.5-325 MG PO TABS: ORAL_TABLET | ORAL | 0 refills | Status: DC

## 2022-11-18 MED ORDER — HYDROCODONE BIT-HOMATROP MBR 5-1.5 MG/5ML PO SOLN: 5.0000 mL | Freq: Three times a day (TID) | ORAL | 0 refills | Status: DC | PRN

## 2022-11-18 MED ORDER — TRELEGY ELLIPTA 200-62.5-25 MCG/ACT IN AEPB: 1.0000 | INHALATION_SPRAY | Freq: Every day | RESPIRATORY_TRACT | Status: DC

## 2022-11-18 NOTE — Patient Instructions (Signed)
Please review the attached list of medications and notify my office if there are any errors.   Take one puff of Trelegy once a day in place of Symbicort and Spiriva

## 2022-11-18 NOTE — Progress Notes (Signed)
I,Joseline E Rosas,acting as a scribe for Mila Merry, MD.,have documented all relevant documentation on the behalf of Mila Merry, MD,as directed by  Mila Merry, MD while in the presence of Mila Merry, MD.    Established patient visit   Patient: Erin Sherman   DOB: March 18, 1966   57 y.o. Female  MRN: [id number] Visit Date: 11/18/2022  Today's healthcare provider: Mila Merry, MD   Chief Complaint  Patient presents with   Cough   Subjective    Cough This is a recurrent problem. Episode onset: since 02/19 when had COVID. The problem has been unchanged. The problem occurs constantly. The cough is Productive of sputum. Associated symptoms include nasal congestion and wheezing. Pertinent negatives include no headaches, postnasal drip, rhinorrhea or sore throat. Associated symptoms comments: Chest congestion, chest soreness. Exacerbated by: heat. Treatments tried: inhaler and nebulizer treatments 3 times a day. The treatment provided no relief.    She is also due for follow up of medications of her medications for lumbar DDD and radiculopathy. She feels her current medications are working well and is taking as prescribed. Pain is nearly debilitating without medications.  Medications: Outpatient Medications Prior to Visit  Medication Sig   albuterol (VENTOLIN HFA) 108 (90 Base) MCG/ACT inhaler TAKE 2 PUFFS BY MOUTH EVERY 6 HOURS AS NEEDED   azelastine (ASTELIN) 0.1 % nasal spray Place 2 sprays into both nostrils 2 (two) times daily. Use in each nostril as directed   budesonide-formoterol (SYMBICORT) 160-4.5 MCG/ACT inhaler Inhale 2 puffs into the lungs 2 (two) times daily.   calcium carbonate (TUMS - DOSED IN MG ELEMENTAL CALCIUM) 500 MG chewable tablet Chew 1 tablet by mouth daily as needed for indigestion.   Cetirizine HCl (ZYRTEC PO) Take by mouth.   cholecalciferol (VITAMIN D) 1000 UNITS tablet Take 1,000 Units by mouth daily.   EPINEPHRINE 0.3 mg/0.3 mL IJ SOAJ  injection INJECT 0.3 MLS (0.3 MG TOTAL) INTO THE MUSCLE AS NEEDED. WITH ALLERGIC REACTION   fluticasone (FLONASE) 50 MCG/ACT nasal spray SPRAY 2 SPRAYS INTO EACH NOSTRIL EVERY DAY   ipratropium-albuterol (DUONEB) 0.5-2.5 (3) MG/3ML SOLN INHALE 3 MLS INTO THE LUNGS EVERY 6 (SIX) HOURS AS NEEDED. REPORTED ON 12/28/2015   levalbuterol (XOPENEX HFA) 45 MCG/ACT inhaler Inhale 2 puffs into the lungs every 6 (six) hours as needed for wheezing. May substitute generic   nabumetone (RELAFEN) 500 MG tablet Take 2 tablets (1,000 mg total) by mouth daily as needed.   ondansetron (ZOFRAN-ODT) 4 MG disintegrating tablet Take 1 tablet (4 mg total) by mouth every 8 (eight) hours as needed for nausea or vomiting.   oxyCODONE-acetaminophen (PERCOCET) 7.5-325 MG tablet Take one to 2 tablets every eight hours as needed for pain   sodium chloride HYPERTONIC 3 % nebulizer solution Use one- 15 mL ampule, up to 4 times daily, as needed, via nebulizer for cough/chest tightness.   Spacer/Aero-Holding Chambers (OPTICHAMBER ADVANTAGE) MISC 1 each by Other route once. Always uses her when you're using a metered-dose inhaler. You've aromatase medicine as much, he won't have his much side effect, but you it twice as much medicine and your lungs.   Tiotropium Bromide Monohydrate (SPIRIVA RESPIMAT) 2.5 MCG/ACT AERS Inhale 2 puffs into the lungs daily.   triamcinolone cream (KENALOG) 0.5 % Apply 1 Application topically 3 (three) times daily.   aspirin 81 MG EC tablet Take 1 tablet (81 mg total) by mouth daily. Swallow whole. (Patient not taking: Reported on 11/18/2022)   benzonatate (TESSALON PERLES) 100  MG capsule Take 1 capsule (100 mg total) by mouth 3 (three) times daily as needed for cough.   brompheniramine-pseudoephedrine-DM 30-2-10 MG/5ML syrup Take 10 mLs by mouth 4 (four) times daily as needed.   montelukast (SINGULAIR) 10 MG tablet Take 1 tablet (10 mg total) by mouth at bedtime. (Patient not taking: Reported on 11/18/2022)    phentermine 37.5 MG capsule Take 1 capsule (37.5 mg total) by mouth every morning. DO NOT TAKE THIS MEDICATION WHEN TAKING ZYRTEC-D (Patient not taking: Reported on 11/18/2022)   predniSONE (DELTASONE) 50 MG tablet Take 1 tablet (50 mg total) by mouth daily with breakfast.   promethazine (PHENERGAN) 25 MG tablet Take 1 tablet (25 mg total) by mouth every 8 (eight) hours as needed for nausea. (Patient not taking: Reported on 11/18/2022)   Semaglutide-Weight Management (WEGOVY) 0.25 MG/0.5ML SOAJ Inject 0.25mg  once a week for 4 weeks, then increase to 0.5mg  once a week   tirzepatide (ZEPBOUND) 2.5 MG/0.5ML Pen Inject 2.5 mg into the skin once a week.   No facility-administered medications prior to visit.    Review of Systems  HENT:  Negative for postnasal drip, rhinorrhea and sore throat.   Respiratory:  Positive for cough and wheezing.   Neurological:  Negative for headaches.       Objective    BP 114/65 (BP Location: Right Arm, Patient Position: Sitting, Cuff Size: Large)   Pulse 90   Temp 99.5 F (37.5 C) (Oral)   Resp 16   Wt 290 lb (131.5 kg)   SpO2 99%   BMI 56.64 kg/m    Physical Exam   General: Appearance:    Obese female in no acute distress  Eyes:    PERRL, conjunctiva/corneas clear, EOM's intact       Lungs:     Rare expiratory wheeze respirations unlabored  Heart:    Normal heart rate. Normal rhythm. No murmurs, rubs, or gallops.    MS:   All extremities are intact.    Neurologic:   Awake, alert, oriented x 3. No apparent focal neurological defect.         Assessment & Plan     1. Chronic asthma without complication, unspecified asthma severity, unspecified whether persistent  - Fluticasone-Umeclidin-Vilant (TRELEGY ELLIPTA) 200-62.5-25 MCG/ACT AEPB; Inhale 1 puff into the lungs daily.  Dispense: 2 each; Refill: EACH - Ambulatory referral to Pulmonology  2. Chronic cough  - Ambulatory referral to Pulmonology - HYDROcodone bit-homatropine (HYCODAN) 5-1.5  MG/5ML syrup; Take 5 mLs by mouth every 8 (eight) hours as needed for cough.  Dispense: 120 mL; Refill: 0  3. DDD (degenerative disc disease), lumbar refill - oxyCODONE-acetaminophen (PERCOCET) 7.5-325 MG tablet; Take one to 2 tablets every eight hours as needed for pain  Dispense: 30 tablet; Refill: 0  4. Lumbar radiculitis   5. Chronic midline low back pain without sciatica       The entirety of the information documented in the History of Present Illness, Review of Systems and Physical Exam were personally obtained by me. Portions of this information were initially documented by the CMA and reviewed by me for thoroughness and accuracy.     Mila Merry, MD  [facility] [phone] (phone) [phone] (fax)  [facility]

## 2022-11-22 ENCOUNTER — Telehealth: Payer: Self-pay | Admitting: Family Medicine

## 2022-11-22 NOTE — Telephone Encounter (Signed)
Covermymeds requesting prior authorization Key: [id number] Name: [place] oxyCODONE-Acetaminophen 7.5-325MG  tablets

## 2022-11-23 NOTE — Telephone Encounter (Signed)
PA initiated via covermymeds. Message from covermymeds.    If [employer] has not responded to your request within 24 hours, contact [employer] at [phone]. If you think there may be a problem with your PA request, use our live chat feature at the bottom right.

## 2022-11-30 NOTE — Telephone Encounter (Signed)
[facility] advised. 

## 2022-11-30 NOTE — Telephone Encounter (Signed)
Approved on May 23 Your PA request has been approved. Additional information will be provided in the approval communication. (Message 1145) Authorization Expiration Date: 05/25/2023

## 2022-12-06 ENCOUNTER — Ambulatory Visit: Payer: BC Managed Care – PPO | Admitting: Nurse Practitioner

## 2022-12-06 ENCOUNTER — Ambulatory Visit
Admission: RE | Admit: 2022-12-06 | Discharge: 2022-12-06 | Disposition: A | Discharge status: Home / Self Care | Payer: BC Managed Care – PPO | Source: Ambulatory Visit | Attending: Nurse Practitioner | Admitting: Nurse Practitioner

## 2022-12-06 ENCOUNTER — Encounter: Payer: Self-pay | Admitting: Nurse Practitioner

## 2022-12-06 ENCOUNTER — Other Ambulatory Visit
Admission: RE | Admit: 2022-12-06 | Discharge: 2022-12-06 | Disposition: A | Discharge status: Home / Self Care | Payer: BC Managed Care – PPO | Source: Ambulatory Visit | Attending: Nurse Practitioner | Admitting: Nurse Practitioner

## 2022-12-06 VITALS — BP 126/80 | HR 87 | Temp 98.4°F | Ht 60.0 in | Wt 288.4 lb

## 2022-12-06 DIAGNOSIS — R053 Chronic cough: Secondary | ICD-10-CM

## 2022-12-06 DIAGNOSIS — J4551 Severe persistent asthma with (acute) exacerbation: Secondary | ICD-10-CM

## 2022-12-06 DIAGNOSIS — G4733 Obstructive sleep apnea (adult) (pediatric): Secondary | ICD-10-CM | POA: Diagnosis not present

## 2022-12-06 DIAGNOSIS — J302 Other seasonal allergic rhinitis: Secondary | ICD-10-CM | POA: Insufficient documentation

## 2022-12-06 DIAGNOSIS — G4719 Other hypersomnia: Secondary | ICD-10-CM

## 2022-12-06 LAB — CBC WITH DIFFERENTIAL/PLATELET
Abs Immature Granulocytes: 0.03 10*3/uL (ref 0.00–0.07)
Basophils Absolute: 0.1 10*3/uL (ref 0.0–0.1)
Basophils Relative: 1 %
Eosinophils Absolute: 0.2 10*3/uL (ref 0.0–0.5)
Eosinophils Relative: 2 %
HCT: 38.5 % (ref 36.0–46.0)
Hemoglobin: 11.9 g/dL — ABNORMAL LOW (ref 12.0–15.0)
Immature Granulocytes: 0 %
Lymphocytes Relative: 33 %
Lymphs Abs: 2.6 10*3/uL (ref 0.7–4.0)
MCH: 26.4 pg (ref 26.0–34.0)
MCHC: 30.9 g/dL (ref 30.0–36.0)
MCV: 85.6 fL (ref 80.0–100.0)
Monocytes Absolute: 0.5 10*3/uL (ref 0.1–1.0)
Monocytes Relative: 7 %
Neutro Abs: 4.5 10*3/uL (ref 1.7–7.7)
Neutrophils Relative %: 57 %
Platelets: 304 10*3/uL (ref 150–400)
RBC: 4.5 MIL/uL (ref 3.87–5.11)
RDW: 13.1 % (ref 11.5–15.5)
WBC: 7.9 10*3/uL (ref 4.0–10.5)
nRBC: 0 % (ref 0.0–0.2)

## 2022-12-06 LAB — NITRIC OXIDE: Nitric Oxide: 30

## 2022-12-06 MED ORDER — FLUTICASONE-SALMETEROL 500-50 MCG/ACT IN AEPB: 1.0000 | INHALATION_SPRAY | Freq: Two times a day (BID) | RESPIRATORY_TRACT | 5 refills | Status: DC

## 2022-12-06 MED ORDER — PREDNISONE 10 MG PO TABS: ORAL_TABLET | ORAL | 0 refills | Status: DC

## 2022-12-06 MED ORDER — METHYLPREDNISOLONE ACETATE 80 MG/ML IJ SUSP
80.0000 mg | Freq: Once | INTRAMUSCULAR | Status: AC
  Administered 2022-12-06: 80 mg via INTRAMUSCULAR

## 2022-12-06 NOTE — Assessment & Plan Note (Signed)
See above

## 2022-12-06 NOTE — Assessment & Plan Note (Signed)
Off therapy >6 months. Has trouble with nasal mask due to sinus problems. Needs to be wearing full face mask. Insurance will not provide her with new supplies due to non-compliance. Will repeat her sleep study and then send orders for new supplies so she can resume PAP therapy. Educated on importance of compliance and risks of untreated OSA. Cautioned on safe driving practices. Healthy weight loss encouraged.

## 2022-12-06 NOTE — Assessment & Plan Note (Addendum)
Poorly controlled asthmatic post COVID. Currently with asthmatic bronchitis with elevated exhaled nitric oxide testing. She has received benefit from both inhaled and oral corticosteroids. We will treat her with depo injection, prednisone taper and step her up to high dose Advair. Continue cough control measures. Encouraged her to restart singulair for trigger prevention. Action plan in place. CXR today. Labs today to assess for eosinophilia and allergic component. May need to consider biologic therapy if she remains poorly controlled.   Patient Instructions  Continue Albuterol inhaler 2 puffs or 3 mL neb every 6 hours as needed for shortness of breath or wheezing. Notify if symptoms persist despite rescue inhaler/neb use.  Restart singulair 1 tab At bedtime  Continue azelastine nasal spray 2 sprays each nostril Twice daily  Continue flonase nasal spray 2 sprays each nostril daily Continue cetirizine 1 tab daily for allergies   Stop Trelegy. Start Advair 500 mcg 1 puff Twice daily. Brush tongue and rinse mouth afterwards Prednisone taper. 4 tabs for 2 days, then 3 tabs for 2 days, 2 tabs for 2 days, then 1 tab for 2 days, then stop. Take in AM with food. Start tomorrow  Chest x ray today Labs today  Follow up in 4-6 weeks with Dr. Belia Heman or Florentina Addison Seamus Warehime,NP to see how new inhaler and steroids have helped. If symptoms do not improve or worsen, please contact office for sooner follow up or seek emergency care.

## 2022-12-06 NOTE — Patient Instructions (Addendum)
Continue Albuterol inhaler 2 puffs or 3 mL neb every 6 hours as needed for shortness of breath or wheezing. Notify if symptoms persist despite rescue inhaler/neb use.  Restart singulair 1 tab At bedtime  Continue azelastine nasal spray 2 sprays each nostril Twice daily  Continue flonase nasal spray 2 sprays each nostril daily Continue cetirizine 1 tab daily for allergies   Stop Trelegy. Start Advair 500 mcg 1 puff Twice daily. Brush tongue and rinse mouth afterwards Prednisone taper. 4 tabs for 2 days, then 3 tabs for 2 days, 2 tabs for 2 days, then 1 tab for 2 days, then stop. Take in AM with food. Start tomorrow  Chest x ray today Labs today  Follow up in 4-6 weeks with Dr. Belia Heman or Erin Addison Misaki Sozio,NP to see how new inhaler and steroids have helped. If symptoms do not improve or worsen, please contact office for sooner follow up or seek emergency care.

## 2022-12-06 NOTE — Progress Notes (Signed)
@Patient  ID: Nat Math, female    DOB: 09/18/1965, 57 y.o.   MRN: [id number]  Chief Complaint  Patient presents with   Follow-up    SOB with exertion. Wheezing. Cough is better with Hycodan. CPAP- has not used in 6 months. Needs new mask.    Referring provider: Malva Limes, MD  HPI: 57 year old female, former smoker followed for asthma, chronic cough, OSA. She is a patient of Dr. Clovis Fredrickson and last seen in office 08/11/2021. Past medical history significant for recurrent sinusitis, GERD, DDD, HLD, obesity.  TEST/EVENTS:  07/25/2013 PFT: FVC 59, FEV1 62, ratio 72, TLC 87, DLCO 62.  Positive BD (29%) 03/03/2021 echo: EF 60 to 65%.  Diastolic parameters normal.  RV size and function normal. 07/13/2021 HST: AHI 10.4, SpO2 low 75%  08/11/2021: OV with Dr. Belia Heman.  Ongoing symptoms of cough, congestion and wheezing related to post COVID infection 7 months ago.  She has been on several rounds of antibiotics and prednisone.  Currently on Symbicort 160.  Also using albuterol MDI as needed.  Did not want any further courses of prednisone.  Encouraged her to continue Symbicort and add on Spiriva.  Home sleep study showed mild sleep apnea with AHI of 10.  CPAP titration study was pending.  12/06/2022: Today - follow up Patient presents today for overdue follow up. She was referred back to our group by her PCP. She has been having more issues with her cough nd shortness of breath since she had COVID this most recent time in February. She completed a course of steroids in February, which helped but cough and SOB returned after coming off. Her PCP saw her on 5/17 (note reviewed) and stepped her up to Trelegy 200 inhaler. She was also provided with hycodan cough syrup. She does tell me that her breathing and cough seem to be better with both of these but not entirely improved. Cough is mostly dry. Wheezing, especially after her showers. Chest also feels tight after showers and with activity. She denies any  fevers, chills, hemoptysis, leg swelling, orthopnea. She does have chronic sinus symptoms with nasal congestion and drainage. No headaches, sore throat or sinus tenderness. She is using her rescue at least once a day. She uses flonase and astelin. She has not restarted her singulair.  Not wearing her CPAP anymore. Didn't like the way it fit. Felt like she needed a full face mask, not a nasal mask. Insurance won't pay for new supplies since she's been off therapy. Denies drowsy driving.   ACT 11 FeNO 30 ppb  Allergies  Allergen Reactions   Latex     swelling   Sulfa Antibiotics     Hives, itching   Meperidine Rash    Immunization History  Administered Date(s) Administered   Influenza Split 03/16/2013   Influenza,inj,Quad PF,6+ Mos 04/16/2021   Influenza-Unspecified 05/01/2015, 04/27/2017, 04/28/2019, 04/22/2020   PFIZER(Purple Top)SARS-COV-2 Vaccination 01/28/2020, 02/17/2020   Pneumococcal Polysaccharide-23 05/15/2015   Td 09/09/2005    Past Medical History:  Diagnosis Date   Arthritis    Cervical cancer (HCC)    Hx of bronchitis    Migraines    Stomach ulcer    TIA (transient ischemic attack)     Tobacco History: Social History   Tobacco Use  Smoking Status Former   Packs/day: 0.30   Years: 25.00   Additional pack years: 0.00   Total pack years: 7.50   Types: Cigarettes   Quit date: 07/16/2008   Years  since quitting: 14.4  Smokeless Tobacco Never   Counseling given: Not Answered   Outpatient Medications Prior to Visit  Medication Sig Dispense Refill   albuterol (VENTOLIN HFA) 108 (90 Base) MCG/ACT inhaler TAKE 2 PUFFS BY MOUTH EVERY 6 HOURS AS NEEDED 6.7 g 5   aspirin 81 MG EC tablet Take 1 tablet (81 mg total) by mouth daily. Swallow whole.     azelastine (ASTELIN) 0.1 % nasal spray Place 2 sprays into both nostrils 2 (two) times daily. Use in each nostril as directed 30 mL 12   calcium carbonate (TUMS - DOSED IN MG ELEMENTAL CALCIUM) 500 MG chewable tablet  Chew 1 tablet by mouth daily as needed for indigestion.     Cetirizine HCl (ZYRTEC PO) Take by mouth.     cholecalciferol (VITAMIN D) 1000 UNITS tablet Take 1,000 Units by mouth daily.     EPINEPHRINE 0.3 mg/0.3 mL IJ SOAJ injection INJECT 0.3 MLS (0.3 MG TOTAL) INTO THE MUSCLE AS NEEDED. WITH ALLERGIC REACTION 2 each 1   fluticasone (FLONASE) 50 MCG/ACT nasal spray SPRAY 2 SPRAYS INTO EACH NOSTRIL EVERY DAY 48 mL 1   HYDROcodone bit-homatropine (HYCODAN) 5-1.5 MG/5ML syrup Take 5 mLs by mouth every 8 (eight) hours as needed for cough. 120 mL 0   ipratropium-albuterol (DUONEB) 0.5-2.5 (3) MG/3ML SOLN INHALE 3 MLS INTO THE LUNGS EVERY 6 (SIX) HOURS AS NEEDED. REPORTED ON 12/28/2015 360 mL 0   nabumetone (RELAFEN) 500 MG tablet Take 2 tablets (1,000 mg total) by mouth daily as needed. 30 tablet 2   ondansetron (ZOFRAN-ODT) 4 MG disintegrating tablet Take 1 tablet (4 mg total) by mouth every 8 (eight) hours as needed for nausea or vomiting. 16 tablet 0   oxyCODONE-acetaminophen (PERCOCET) 7.5-325 MG tablet Take one to 2 tablets every eight hours as needed for pain 30 tablet 0   sodium chloride HYPERTONIC 3 % nebulizer solution Use one- 15 mL ampule, up to 4 times daily, as needed, via nebulizer for cough/chest tightness. 450 mL 0   Spacer/Aero-Holding Chambers (OPTICHAMBER ADVANTAGE) MISC 1 each by Other route once. Always uses her when you're using a metered-dose inhaler. You've aromatase medicine as much, he won't have his much side effect, but you it twice as much medicine and your lungs. 1 each 0   triamcinolone cream (KENALOG) 0.5 % Apply 1 Application topically 3 (three) times daily. 45 g 2   Fluticasone-Umeclidin-Vilant (TRELEGY ELLIPTA) 200-62.5-25 MCG/ACT AEPB Inhale 1 puff into the lungs daily. 2 each EACH   levalbuterol (XOPENEX HFA) 45 MCG/ACT inhaler Inhale 2 puffs into the lungs every 6 (six) hours as needed for wheezing. May substitute generic (Patient not taking: Reported on 12/06/2022) 1  each 12   montelukast (SINGULAIR) 10 MG tablet Take 1 tablet (10 mg total) by mouth at bedtime. (Patient not taking: Reported on 11/18/2022) 30 tablet 3   No facility-administered medications prior to visit.     Review of Systems:   Constitutional: No weight loss or gain, night sweats, fevers, chills, or lassitude. +fatigue  HEENT: No headaches, difficulty swallowing, tooth/dental problems, or sore throat. No sneezing, itching, ear ache. + nasal congestion, post nasal drip CV:  No chest pain, orthopnea, PND, swelling in lower extremities, anasarca, dizziness, palpitations, syncope Resp: +shortness of breath with exertion; wheezing; cough. No excess mucus or change in color of mucus. No hemoptysis. No chest wall deformity GI:  No heartburn, indigestion, abdominal pain, nausea, vomiting, diarrhea, change in bowel habits, loss of appetite, bloody stools.  GU: No dysuria, change in color of urine, urgency or frequency.  Skin: No rash, lesions, ulcerations MSK:  No joint pain or swelling.  Neuro: No dizziness or lightheadedness.  Psych: No depression or anxiety. Mood stable.     Physical Exam:  BP 126/80 (BP Location: Right Wrist, Cuff Size: Large)   Pulse 87   Temp 98.4 F (36.9 C)   Ht 5' (1.524 m)   Wt 288 lb 6.4 oz (130.8 kg)   SpO2 98%   BMI 56.32 kg/m   GEN: Pleasant, interactive, well-kempt; morbidly obese; in no acute distress HEENT:  Normocephalic and atraumatic. PERRLA. Sclera white. Nasal turbinates pink, moist and patent bilaterally. No rhinorrhea present. Oropharynx pink and moist, without exudate or edema. No lesions, ulcerations, or postnasal drip.  NECK:  Supple w/ fair ROM. No JVD present. Normal carotid impulses w/o bruits. Thyroid symmetrical with no goiter or nodules palpated. No lymphadenopathy.   CV: RRR, no m/r/g, no peripheral edema. Pulses intact, +2 bilaterally. No cyanosis, pallor or clubbing. PULMONARY:  Unlabored, regular breathing. Scattered rhonchi with  end expiratory wheezes bilaterally A&P. No accessory muscle use.  GI: BS present and normoactive. Soft, non-tender to palpation. No organomegaly or masses detected.  MSK: No erythema, warmth or tenderness. Cap refil <2 sec all extrem. No deformities or joint swelling noted.  Neuro: A/Ox3. No focal deficits noted.   Skin: Warm, no lesions or rashe Psych: Normal affect and behavior. Judgement and thought content appropriate.     Lab Results:  CBC    Component Value Date/Time   WBC 7.9 12/06/2022 0943   RBC 4.50 12/06/2022 0943   HGB 11.9 (L) 12/06/2022 0943   HGB 11.8 02/28/2020 1407   HCT 38.5 12/06/2022 0943   HCT 37.4 02/28/2020 1407   PLT 304 12/06/2022 0943   PLT 327 02/28/2020 1407   MCV 85.6 12/06/2022 0943   MCV 85 02/28/2020 1407   MCV 84 09/30/2014 2130   MCH 26.4 12/06/2022 0943   MCHC 30.9 12/06/2022 0943   RDW 13.1 12/06/2022 0943   RDW 12.0 02/28/2020 1407   RDW 13.4 09/30/2014 2130   LYMPHSABS 2.6 12/06/2022 0943   MONOABS 0.5 12/06/2022 0943   EOSABS 0.2 12/06/2022 0943   BASOSABS 0.1 12/06/2022 0943    BMET    Component Value Date/Time   NA 138 09/13/2022 1122   NA 140 02/28/2020 1407   NA 137 09/30/2014 2130   K 3.7 09/13/2022 1122   K 4.5 09/30/2014 2130   CL 105 09/13/2022 1122   CL 102 09/30/2014 2130   CO2 24 09/13/2022 1122   CO2 26 09/30/2014 2130   GLUCOSE 115 (H) 09/13/2022 1122   GLUCOSE 156 (H) 09/30/2014 2130   BUN 8 09/13/2022 1122   BUN 9 02/28/2020 1407   BUN 16 09/30/2014 2130   CREATININE 0.57 09/13/2022 1122   CREATININE 0.70 09/30/2014 2130   CALCIUM 8.9 09/13/2022 1122   CALCIUM 9.2 09/30/2014 2130   GFRNONAA >60 09/13/2022 1122   GFRNONAA >60 09/30/2014 2130   GFRAA 119 02/28/2020 1407   GFRAA >60 09/30/2014 2130    BNP    Component Value Date/Time   BNP 27 09/30/2014 2130     Imaging:  DG Chest 2 View  Result Date: 12/06/2022 CLINICAL DATA:  Chronic cough post COVID.  Shortness of breath. EXAM: CHEST - 2  VIEW COMPARISON:  01/17/2021; 12/18/2017 FINDINGS: Examination is degraded due to patient body habitus. Grossly unchanged cardiac silhouette and mediastinal contours. There  is diffuse slightly nodular thickening of the pulmonary interstitium. No discrete focal airspace opacities. No pleural effusion or pneumothorax. No evidence of edema. No acute osseous abnormalities. IMPRESSION: Findings suggestive of airways disease / bronchitis, though note, atypical infection could have a similar appearance. A follow-up chest radiograph in 3 to 4 weeks after treatment is recommended to ensure resolution. Electronically Signed   By: Simonne Come M.D.   On: 12/06/2022 10:50    methylPREDNISolone acetate (DEPO-MEDROL) injection 80 mg     Date Action Dose Route User   12/06/2022 0916 Given 80 mg Intramuscular (Right Upper Outer Quadrant) Bonney Leitz, CMA           No data to display          Lab Results  Component Value Date   NITRICOXIDE 30 12/06/2022        Assessment & Plan:   Poorly controlled severe persistent asthma with acute exacerbation Poorly controlled asthmatic post COVID. Currently with asthmatic bronchitis with elevated exhaled nitric oxide testing. She has received benefit from both inhaled and oral corticosteroids. We will treat her with depo injection, prednisone taper and step her up to high dose Advair. Continue cough control measures. Encouraged her to restart singulair for trigger prevention. Action plan in place. CXR today. Labs today to assess for eosinophilia and allergic component. May need to consider biologic therapy if she remains poorly controlled.   Patient Instructions  Continue Albuterol inhaler 2 puffs or 3 mL neb every 6 hours as needed for shortness of breath or wheezing. Notify if symptoms persist despite rescue inhaler/neb use.  Restart singulair 1 tab At bedtime  Continue azelastine nasal spray 2 sprays each nostril Twice daily  Continue flonase nasal  spray 2 sprays each nostril daily Continue cetirizine 1 tab daily for allergies   Stop Trelegy. Start Advair 500 mcg 1 puff Twice daily. Brush tongue and rinse mouth afterwards Prednisone taper. 4 tabs for 2 days, then 3 tabs for 2 days, 2 tabs for 2 days, then 1 tab for 2 days, then stop. Take in AM with food. Start tomorrow  Chest x ray today Labs today  Follow up in 4-6 weeks with Dr. Belia Heman or Florentina Addison Derk Doubek,NP to see how new inhaler and steroids have helped. If symptoms do not improve or worsen, please contact office for sooner follow up or seek emergency care.    Allergic rhinitis See above.   Mild obstructive sleep apnea Off therapy >6 months. Has trouble with nasal mask due to sinus problems. Needs to be wearing full face mask. Insurance will not provide her with new supplies due to non-compliance. Will repeat her sleep study and then send orders for new supplies so she can resume PAP therapy. Educated on importance of compliance and risks of untreated OSA. Cautioned on safe driving practices. Healthy weight loss encouraged.    I spent 42 minutes of dedicated to the care of this patient on the date of this encounter to include pre-visit review of records, face-to-face time with the patient discussing conditions above, post visit ordering of testing, clinical documentation with the electronic health record, making appropriate referrals as documented, and communicating necessary findings to members of the patients care team.  Noemi Chapel, NP 12/06/2022  Pt aware and understands NP's role.

## 2022-12-07 ENCOUNTER — Encounter: Payer: Self-pay | Admitting: Nurse Practitioner

## 2022-12-09 ENCOUNTER — Other Ambulatory Visit: Payer: Self-pay

## 2022-12-09 LAB — ALLERGEN PANEL (27) + IGE
Alternaria Alternata IgE: <0.1 kU/L
Aspergillus Fumigatus IgE: 0.14 kU/L — AB
Bahia Grass IgE: 18.3 kU/L — AB
Bermuda Grass IgE: 16.5 kU/L — AB
Cat Dander IgE: 0.66 kU/L — AB
Cedar, Mountain IgE: 3.38 kU/L — AB
Cladosporium Herbarum IgE: <0.1 kU/L
Cocklebur IgE: 1.65 kU/L — AB
Cockroach, American IgE: 0.97 kU/L — AB
Common Silver Birch IgE: 0.72 kU/L — AB
D Farinae IgE: 27.6 kU/L — AB
D Pteronyssinus IgE: 20 kU/L — AB
Dog Dander IgE: 49.5 kU/L — AB
Elm, American IgE: 1.12 kU/L — AB
Hickory, White IgE: 3.75 kU/L — AB
IgE (Immunoglobulin E), Serum: 1088 IU/mL — ABNORMAL HIGH (ref 6–495)
Johnson Grass IgE: 16.1 kU/L — AB
Kentucky Bluegrass IgE: 48.1 kU/L — AB
Maple/Box Elder IgE: 1.02 kU/L — AB
Mucor Racemosus IgE: 0.13 kU/L — AB
Oak, White IgE: 10.3 kU/L — AB
Penicillium Chrysogen IgE: <0.1 kU/L
Pigweed, Rough IgE: 0.99 kU/L — AB
Plantain, English IgE: 1.46 kU/L — AB
Ragweed, Short IgE: 12.1 kU/L — AB
Setomelanomma Rostrat: <0.1 kU/L
Timothy Grass IgE: 32.9 kU/L — AB
White Mulberry IgE: 0.62 kU/L — AB

## 2022-12-09 MED ORDER — AZITHROMYCIN 250 MG PO TABS: ORAL_TABLET | ORAL | 0 refills | Status: AC

## 2022-12-09 NOTE — Progress Notes (Signed)
CXR shows evidence of bronchitis/small airways disease. We started her on prednisone at her last visit. If no improvement over the past few days, recommend addition of z pack to cover for any atypical infection. Also, her allergy testing revealed significant environmental allergies to dust, cockroaches, fungus, trees, grasses. If no improvement with the previous recommendations, we will send her to allergy to see if she would be a candidate for allergy shots. Thanks!

## 2022-12-17 ENCOUNTER — Other Ambulatory Visit: Payer: Self-pay | Admitting: Family Medicine

## 2022-12-22 ENCOUNTER — Telehealth: Payer: Self-pay

## 2022-12-22 ENCOUNTER — Other Ambulatory Visit: Payer: Self-pay

## 2022-12-22 NOTE — Telephone Encounter (Signed)
Patient advised we did not have any samples.  Patient wanted Rx sent to pharmacy After checking it appears another provider in a different office sent it in on 12/06/22 with 5 refills.    Left message for the patient    Copied from CRM [id number]. Topic: General - Other >> Dec 22, 2022 11:07 AM Macon Large wrote: Reason for CRM: Pt requests samples of the Advair. Cb# [phone]

## 2023-01-30 ENCOUNTER — Other Ambulatory Visit: Payer: Self-pay | Admitting: Family Medicine

## 2023-01-30 DIAGNOSIS — J45909 Unspecified asthma, uncomplicated: Secondary | ICD-10-CM

## 2023-01-31 ENCOUNTER — Encounter: Payer: Self-pay | Admitting: Internal Medicine

## 2023-01-31 ENCOUNTER — Other Ambulatory Visit: Payer: Self-pay | Admitting: Internal Medicine

## 2023-01-31 ENCOUNTER — Ambulatory Visit: Payer: BC Managed Care – PPO | Admitting: Internal Medicine

## 2023-01-31 VITALS — BP 128/80 | HR 95 | Temp 97.9°F | Ht 60.0 in | Wt 285.4 lb

## 2023-01-31 DIAGNOSIS — J4551 Severe persistent asthma with (acute) exacerbation: Secondary | ICD-10-CM

## 2023-01-31 MED ORDER — TRELEGY ELLIPTA 200-62.5-25 MCG/ACT IN AEPB: 1.0000 | INHALATION_SPRAY | Freq: Every day | RESPIRATORY_TRACT | Status: DC

## 2023-01-31 MED ORDER — PREDNISONE 20 MG PO TABS: 40.0000 mg | ORAL_TABLET | Freq: Every day | ORAL | 2 refills | Status: DC

## 2023-01-31 MED ORDER — TRELEGY ELLIPTA 200-62.5-25 MCG/ACT IN AEPB: 1.0000 | INHALATION_SPRAY | Freq: Every day | RESPIRATORY_TRACT | 10 refills | Status: DC

## 2023-01-31 NOTE — Progress Notes (Signed)
[facility] Consultation      Date: 01/31/2023,   MRN# [id number] Erin Sherman 06-17-1966   Erin Sherman is a 57 y.o. old female seen in consultation for ASTHMA   SYNOPSIS 15 -year-old pleasant African-American female morbidly obese seen today for ongoing symptoms of cough congestion and wheezing related to post COVID-19 infection several months ago  Patient has been on several rounds of antibiotics and prednisone Has tried Advair and Symbicort and recently Trelegy 200 which has helped the most CBC several months ago did not show significant eosinophilia CBC    Component Value Date/Time   WBC 7.9 12/06/2022 0943   RBC 4.50 12/06/2022 0943   HGB 11.9 (L) 12/06/2022 0943   HGB 11.8 02/28/2020 1407   HCT 38.5 12/06/2022 0943   HCT 37.4 02/28/2020 1407   PLT 304 12/06/2022 0943   PLT 327 02/28/2020 1407   MCV 85.6 12/06/2022 0943   MCV 85 02/28/2020 1407   MCV 84 09/30/2014 2130   MCH 26.4 12/06/2022 0943   MCHC 30.9 12/06/2022 0943   RDW 13.1 12/06/2022 0943   RDW 12.0 02/28/2020 1407   RDW 13.4 09/30/2014 2130   LYMPHSABS 2.6 12/06/2022 0943   MONOABS 0.5 12/06/2022 0943   EOSABS 0.2 12/06/2022 0943   BASOSABS 0.1 12/06/2022 0943    CHIEF COMPLAINT:   Follow up asthma    HISTORY OF PRESENT ILLNESS   + Exacerbation at this time Positive cough positive wheezing  No respiratory distress No fevers, chills, nausea, vomiting, diarrhea No evidence of lower extremity edema No evidence hemoptysis  Patient states short of breath increased dyspnea on exertion for the past 12months Patient was diagnosed with COVID in July 2022 Since then patient has had increased work of breathing and shortness of breath and dyspnea on exertion  Patient currently on Trelegy 200 which helps tremendously   Home sleep study shows mild sleep apnea AHI of 10 Results relayed to patient in detail Patient noncompliant with CPAP  Patient is morbidly  obese   PAST MEDICAL HISTORY   Past Medical History:  Diagnosis Date   Arthritis    Cervical cancer (HCC)    Hx of bronchitis    Migraines    Stomach ulcer    TIA (transient ischemic attack)      SURGICAL HISTORY   Past Surgical History:  Procedure Laterality Date   ABDOMINAL HYSTERECTOMY     with unilateral oophorectomy and part of cervix removed due to cervical cancer   CARPAL TUNNEL RELEASE Right 2013   CARPAL TUNNEL RELEASE  03/02/2010   Endoscopic carpal tunnel release. Graciela Husbands, MD (Orthopedic, [facility]   CESAREAN SECTION     COLONOSCOPY WITH PROPOFOL N/A 11/29/2016   Procedure: COLONOSCOPY WITH PROPOFOL;  Surgeon: Midge Minium, MD;  Location: [facility] ENDOSCOPY;  Service: Endoscopy;  Laterality: N/A;   exercise treadmill Test  05/31/2006   Normal   TUBAL LIGATION       FAMILY HISTORY   Family History  Problem Relation Age of Onset   Emphysema Mother    Asthma Mother    Diabetes Mother        type 2   COPD Mother    Hypertension Mother    Emphysema Father    Hypertension Father    COPD Father    Colon cancer Father        died of colon cancer 08/06/2017   Asthma Sister    Asthma Brother    Heart attack Brother  Heart attack Maternal Grandmother    Heart attack Paternal Grandmother    Anemia Sister    Asthma Son    Cancer Maternal Grandfather        prostate   Cancer Maternal Aunt        breast   Cancer Other      SOCIAL HISTORY   Social History   Tobacco Use   Smoking status: Former    Current packs/day: 0.00    Average packs/day: 0.3 packs/day for 25.0 years (7.5 ttl pk-yrs)    Types: Cigarettes    Start date: 07/17/1983    Quit date: 07/16/2008    Years since quitting: 14.5   Smokeless tobacco: Never  Vaping Use   Vaping status: Never Used  Substance Use Topics   Alcohol use: Not Currently   Drug use: No     MEDICATIONS    Home Medication:  Current Outpatient Rx   Order #: [id number] Class: Normal   Order #: [id number] Class: OTC    Order #: [id number] Class: Normal   Order #: [id number] Class: Historical Med   Order #: [id number] Class: Historical Med   Order #: [id number] Class: Historical Med   Order #: [id number] Class: Normal   Order #: [id number] Class: Normal   Order #: [id number] Class: Normal   Order #: [id number] Class: Normal   Order #: [id number] Class: Normal   Order #: [id number] Class: Normal   Order #: [id number] Class: Normal   Order #: [id number] Class: Normal   Order #: [id number] Class: Normal   Order #: [id number] Class: Normal   Order #: [id number] Class: Print   Order #: [id number] Class: Normal    Current Medication:  Current Outpatient Medications:    albuterol (VENTOLIN HFA) 108 (90 Base) MCG/ACT inhaler, TAKE 2 PUFFS BY MOUTH EVERY 6 HOURS AS NEEDED, Disp: 6.7 g, Rfl: 5   aspirin 81 MG EC tablet, Take 1 tablet (81 mg total) by mouth daily. Swallow whole., Disp: , Rfl:    azelastine (ASTELIN) 0.1 % nasal spray, Place 2 sprays into both nostrils 2 (two) times daily. Use in each nostril as directed, Disp: 30 mL, Rfl: 12   calcium carbonate (TUMS - DOSED IN MG ELEMENTAL CALCIUM) 500 MG chewable tablet, Chew 1 tablet by mouth daily as needed for indigestion., Disp: , Rfl:    Cetirizine HCl (ZYRTEC PO), Take by mouth., Disp: , Rfl:    cholecalciferol (VITAMIN D) 1000 UNITS tablet, Take 1,000 Units by mouth daily., Disp: , Rfl:    EPINEPHRINE 0.3 mg/0.3 mL IJ SOAJ injection, INJECT 0.3 MLS (0.3 MG TOTAL) INTO THE MUSCLE AS NEEDED. WITH ALLERGIC REACTION, Disp: 2 each, Rfl: 1   fluticasone (FLONASE) 50 MCG/ACT nasal spray, SPRAY 2 SPRAYS INTO EACH NOSTRIL EVERY DAY, Disp: 48 mL, Rfl: 1   HYDROcodone bit-homatropine (HYCODAN) 5-1.5 MG/5ML syrup, Take 5 mLs by mouth every 8 (eight) hours as needed for cough., Disp: 120 mL, Rfl: 0   ipratropium-albuterol (DUONEB) 0.5-2.5 (3) MG/3ML SOLN, INHALE 3 MLS INTO THE LUNGS EVERY 6 (SIX) HOURS AS NEEDED. REPORTED ON 12/28/2015, Disp: 360 mL, Rfl: 0   levalbuterol (XOPENEX HFA) 45 MCG/ACT  inhaler, Inhale 2 puffs into the lungs every 6 (six) hours as needed for wheezing. May substitute generic, Disp: 1 each, Rfl: 12   montelukast (SINGULAIR) 10 MG tablet, TAKE 1 TABLET BY MOUTH EVERYDAY AT BEDTIME, Disp: 90 tablet, Rfl: 1   nabumetone (RELAFEN) 500 MG tablet, Take 2 tablets (1,000 mg total) by mouth daily as needed., Disp: 30 tablet, Rfl: 2   ondansetron (ZOFRAN-ODT) 4 MG disintegrating  tablet, Take 1 tablet (4 mg total) by mouth every 8 (eight) hours as needed for nausea or vomiting., Disp: 16 tablet, Rfl: 0   oxyCODONE-acetaminophen (PERCOCET) 7.5-325 MG tablet, Take one to 2 tablets every eight hours as needed for pain, Disp: 30 tablet, Rfl: 0   sodium chloride HYPERTONIC 3 % nebulizer solution, Use one- 15 mL ampule, up to 4 times daily, as needed, via nebulizer for cough/chest tightness., Disp: 450 mL, Rfl: 0   Spacer/Aero-Holding Chambers (OPTICHAMBER ADVANTAGE) MISC, 1 each by Other route once. Always uses her when you're using a metered-dose inhaler. You've aromatase medicine as much, he won't have his much side effect, but you it twice as much medicine and your lungs., Disp: 1 each, Rfl: 0   triamcinolone cream (KENALOG) 0.5 %, Apply 1 Application topically 3 (three) times daily., Disp: 45 g, Rfl: 2    ALLERGIES   Latex, Sulfa antibiotics, and Meperidine     REVIEW OF SYSTEMS     BP 128/80 (BP Location: Left Arm, Cuff Size: Large)   Pulse 95   Temp 97.9 F (36.6 C) (Temporal)   Ht 5' (1.524 m)   Wt 285 lb 6.4 oz (129.5 kg)   SpO2 97%   BMI 55.74 kg/m      Review of Systems: Gen:  Denies  fever, sweats, chills weight loss  HEENT: Denies blurred vision, double vision, ear pain, eye pain, hearing loss, nose bleeds, sore throat Cardiac:  No dizziness, chest pain or heaviness, chest tightness,edema, No JVD Resp:  +cough, -sputum production, +shortness of breath,+wheezing, -hemoptysis,  Other:  All other systems negative   Physical Examination:   General  Appearance: No distress  EYES PERRLA, EOM intact.   NECK Supple, No JVD Pulmonary: normal breath sounds, No wheezing.  CardiovascularNormal S1,S2.  No m/r/g.   Abdomen: Benign, Soft, non-tender. Neurology UE/LE 5/5 strength, no focal deficits Ext pulses intact, cap refill intact ALL OTHER ROS ARE NEGATIVE      ASSESSMENT/PLAN   57 year old pleasant African-American female seen today for moderate to severe persistent asthma related previous COVID-19 infection with persistent symptoms of wheezing coughing and increased work of breathing and shortness of breath, associated with morbid obesity in the setting of deconditioned state with obstructive sleep apnea with a history of excessive daytime sleepiness fatigue snoring and choking episodes   Severely persistent asthma secondary to COVID-19 infection and ongoing inflammation CBC did not show increased eosinophil count Patient is asking for prednisone at this time prednisone 40 mg daily for 7 days Will reinstate Trelegy 200 therapy which seems to help her     Excessive daytime sleepiness with fatigue and snoring and choking Home SLeep study reveal AHI 10 Patient is noncompliant therefore is not using CPAP  Please avoid secondhand smoke exposure  Obesity -recommend significant weight loss -recommend changing diet  Deconditioned state -Recommend increased daily activity and exercise   Allergic rhinitis continue Flonase    Mild obstructive sleep apnea Off therapy >6 months. Has trouble with nasal mask due to sinus problems. Needs to be wearing full face mask. Insurance will not provide her with new supplies due to non-compliance. Will repeat her sleep study and then send orders for new supplies so she can resume PAP therapy. Educated on importance of compliance and risks of untreated OSA. Cautioned on safe driving practices. Healthy weight loss encouraged.    MEDICATION ADJUSTMENTS/LABS AND TESTS ORDERED: Trelegy inhaler  200 Prednisone 40 mg daily for 10 days Albuterol as needed  Please  avoid secondhand smoke  Recommend weight loss  CURRENT MEDICATIONS REVIEWED AT LENGTH WITH PATIENT TODAY   Patient  satisfied with Plan of action and management. All questions answered  Follow-up 6 months  Total time spent 32 minutes   Lucie Leather, M.D.  [facility]  Medical Director [facility] Medical Director [facility] Cardio-Pulmonary Department

## 2023-01-31 NOTE — Patient Instructions (Addendum)
Avoid secondhand smoke Avoid SICK contacts Recommend  Masking  when appropriate Recommend Keep up-to-date with vaccinations  Start PRED 40 mg daily for 10 days  Start TRELEGY 200 one puff daily  Use albuterol as needed  Stop Symbicort  Use Flonase daily

## 2023-02-07 ENCOUNTER — Other Ambulatory Visit: Payer: Self-pay | Admitting: Family Medicine

## 2023-02-07 DIAGNOSIS — R21 Rash and other nonspecific skin eruption: Secondary | ICD-10-CM

## 2023-02-07 DIAGNOSIS — L282 Other prurigo: Secondary | ICD-10-CM

## 2023-02-07 DIAGNOSIS — M5136 Other intervertebral disc degeneration, lumbar region: Secondary | ICD-10-CM

## 2023-02-07 DIAGNOSIS — M51369 Other intervertebral disc degeneration, lumbar region without mention of lumbar back pain or lower extremity pain: Secondary | ICD-10-CM

## 2023-02-07 NOTE — Telephone Encounter (Signed)
Medication Refill - Medication: triamcinolone cream (KENALOG) 0.5 % /oxyCODONE-acetaminophen (PERCOCET) 7.5-325 MG tablet  Has the patient contacted their pharmacy? no (Agent: If no, request that the patient contact the pharmacy for the refill. If patient does not wish to contact the pharmacy document the reason why and proceed with request.) (Agent: If yes, when and what did the pharmacy advise?)pt called directly in  Preferred Pharmacy (with phone number or street name):  [id number] - [place], [place] - [address] Phone: [phone]  Fax: [phone]     Has the patient been seen for an appointment in the last year OR does the patient have an upcoming appointment? yes  Agent: Please be advised that RX refills may take up to 3 business days. We ask that you follow-up with your pharmacy.

## 2023-02-08 NOTE — Telephone Encounter (Signed)
Requested medication (s) are due for refill today:yes to both  Requested medication (s) are on the active medication list:yest to both   Last refill:  oxycodone/acetaminophen: 11/18/22 #30                            triamcinolone cream: 09/21/22 45 grams 2 RF  Future visit scheduled: no  Notes to clinic:  neither med delegated to NT to RF   Requested Prescriptions  Pending Prescriptions Disp Refills   oxyCODONE-acetaminophen (PERCOCET) 7.5-325 MG tablet 30 tablet 0    Sig: Take one to 2 tablets every eight hours as needed for pain     Not Delegated - Analgesics:  Opioid Agonist Combinations Failed - 02/07/2023  5:22 PM      Failed - This refill cannot be delegated      Failed - Urine Drug Screen completed in last 360 days      Passed - Valid encounter within last 3 months    Recent Outpatient Visits           2 months ago Chronic asthma without complication, unspecified asthma severity, unspecified whether persistent   [place] [facility] Malva Limes, MD   4 months ago Abdominal bloating   [place] [facility] Malva Limes, MD   7 months ago Chronic fatigue   [place] [facility] [place], [place], CMA   7 months ago Recurrent sinusitis   [place] [facility] [place], [place], PA-C   8 months ago Patellar tendinitis of right knee   [facility] Malva Limes, MD               triamcinolone cream (KENALOG) 0.5 % 45 g 2    Sig: Apply 1 Application topically 3 (three) times daily.     Not Delegated - Dermatology:  Corticosteroids Failed - 02/07/2023  5:22 PM      Failed - This refill cannot be delegated      Passed - Valid encounter within last 12 months    Recent Outpatient Visits           2 months ago Chronic asthma without complication, unspecified asthma severity, unspecified whether persistent   [place] [facility] Malva Limes, MD   4  months ago Abdominal bloating   [place] [facility] Malva Limes, MD   7 months ago Chronic fatigue   [place] [facility] [place], [place], CMA   7 months ago Recurrent sinusitis   [place] [facility] [place], [place], PA-C   8 months ago Patellar tendinitis of right knee   [facility] Malva Limes, MD

## 2023-02-09 MED ORDER — OXYCODONE-ACETAMINOPHEN 7.5-325 MG PO TABS: ORAL_TABLET | ORAL | 0 refills | Status: DC

## 2023-02-09 MED ORDER — TRIAMCINOLONE ACETONIDE 0.5 % EX CREA: 1.0000 | TOPICAL_CREAM | Freq: Three times a day (TID) | CUTANEOUS | 2 refills | Status: DC

## 2023-02-17 ENCOUNTER — Ambulatory Visit: Payer: Self-pay

## 2023-02-17 NOTE — Telephone Encounter (Signed)
Chief Complaint: Sinus pressure/pain Symptoms: 5-6/10 pain around nose, nasal blocked w/no mucus coming out  Frequency: Onset yesterday Pertinent Negatives: Patient denies other symptoms, no fever Disposition: [] ED /[] Urgent Care (no appt availability in office) / [x] Appointment(In office/virtual)/ []  [facility] [facility]/ [] Home Care/ [] Refused Recommended Disposition /[] [facility]/ []  Follow-up with PCP Additional Notes: Patient only wants to see Dr. Sherrie Mustache next Wednesday in office due to being off work that day. Advised nothing available in office, offered MyChart VV. She says after 1530, advised Monday at 1620, she agreed. Appt scheduled.   Reason for Disposition  [1] Sinus congestion (pressure, fullness) AND [2] present > 10 days  Answer Assessment - Initial Assessment Questions 1. LOCATION: "Where does it hurt?"      Around nose 2. ONSET: "When did the sinus pain start?"  (e.g., hours, days)      Yesterday 3. SEVERITY: "How bad is the pain?"   (Scale 1-10; mild, moderate or severe)   - MILD (1-3): doesn't interfere with normal activities    - MODERATE (4-7): interferes with normal activities (e.g., work or school) or awakens from sleep   - SEVERE (8-10): excruciating pain and patient unable to do any normal activities        5-6 4. RECURRENT SYMPTOM: "Have you ever had sinus problems before?" If Yes, ask: "When was the last time?" and "What happened that time?"      Yes 5. NASAL CONGESTION: "Is the nose blocked?" If Yes, ask: "Can you open it or must you breathe through your mouth?"     Yes can breathe out of the left nostril 6. NASAL DISCHARGE: "Do you have discharge from your nose?" If so ask, "What color?"     Nothing coming out, just stuffy 7. FEVER: "Do you have a fever?" If Yes, ask: "What is it, how was it measured, and when did it start?"      No 8. OTHER SYMPTOMS: "Do you have any other symptoms?" (e.g., sore throat, cough, earache, difficulty breathing)      Abdominal bloated  Protocols used: Sinus Pain or Congestion-A-AH

## 2023-02-20 ENCOUNTER — Telehealth: Payer: BC Managed Care – PPO | Admitting: Family Medicine

## 2023-02-20 DIAGNOSIS — J0110 Acute frontal sinusitis, unspecified: Secondary | ICD-10-CM | POA: Diagnosis not present

## 2023-02-20 DIAGNOSIS — J329 Chronic sinusitis, unspecified: Secondary | ICD-10-CM

## 2023-02-20 MED ORDER — AMOXICILLIN 500 MG PO CAPS: 1000.0000 mg | ORAL_CAPSULE | Freq: Two times a day (BID) | ORAL | 0 refills | Status: AC

## 2023-02-27 ENCOUNTER — Telehealth: Payer: Self-pay

## 2023-02-27 NOTE — Progress Notes (Signed)
MyChart Video Visit    Virtual Visit via Video Note   This format is felt to be most appropriate for this patient at this time. Physical exam was limited by quality of the video and audio technology used for the visit.   Patient location: home Provider location: [facility]  I discussed the limitations of evaluation and management by telemedicine and the availability of in person appointments. The patient expressed understanding and agreed to proceed.  Patient: Erin Sherman   DOB: 1965-09-13   57 y.o. Female  MRN: [id number] Visit Date: 02/20/2023  Today's healthcare provider: Mila Merry, MD   Chief Complaint  Patient presents with   Facial Pain   Subjective    Discussed the use of AI scribe software for clinical note transcription with the patient, who gave verbal consent to proceed.  History of Present Illness   Ms. Cepin, a patient with a history of recurrent asthma exacerbations and sinus infections,  presents with a one-week history of right-sided sinus discomfort, suggestive of a sinus infection. She denies any associated fever, chills, or sweats. There is no reported nasal drainage. She has been using Flonase nasal spray for symptom management.       Medications: Outpatient Medications Prior to Visit  Medication Sig   albuterol (VENTOLIN HFA) 108 (90 Base) MCG/ACT inhaler TAKE 2 PUFFS BY MOUTH EVERY 6 HOURS AS NEEDED   aspirin 81 MG EC tablet Take 1 tablet (81 mg total) by mouth daily. Swallow whole.   azelastine (ASTELIN) 0.1 % nasal spray Place 2 sprays into both nostrils 2 (two) times daily. Use in each nostril as directed   calcium carbonate (TUMS - DOSED IN MG ELEMENTAL CALCIUM) 500 MG chewable tablet Chew 1 tablet by mouth daily as needed for indigestion.   Cetirizine HCl (ZYRTEC PO) Take by mouth.   cholecalciferol (VITAMIN D) 1000 UNITS tablet Take 1,000 Units by mouth daily.   EPINEPHRINE 0.3 mg/0.3 mL IJ SOAJ injection INJECT 0.3 MLS (0.3 MG TOTAL)  INTO THE MUSCLE AS NEEDED. WITH ALLERGIC REACTION   fluticasone (FLONASE) 50 MCG/ACT nasal spray SPRAY 2 SPRAYS INTO EACH NOSTRIL EVERY DAY   Fluticasone-Umeclidin-Vilant (TRELEGY ELLIPTA) 200-62.5-25 MCG/ACT AEPB Inhale 1 puff into the lungs daily.   Fluticasone-Umeclidin-Vilant (TRELEGY ELLIPTA) 200-62.5-25 MCG/ACT AEPB Inhale 1 Act into the lungs daily.   HYDROcodone bit-homatropine (HYCODAN) 5-1.5 MG/5ML syrup Take 5 mLs by mouth every 8 (eight) hours as needed for cough.   ipratropium-albuterol (DUONEB) 0.5-2.5 (3) MG/3ML SOLN INHALE 3 MLS INTO THE LUNGS EVERY 6 (SIX) HOURS AS NEEDED. REPORTED ON 12/28/2015   levalbuterol (XOPENEX HFA) 45 MCG/ACT inhaler Inhale 2 puffs into the lungs every 6 (six) hours as needed for wheezing. May substitute generic   montelukast (SINGULAIR) 10 MG tablet TAKE 1 TABLET BY MOUTH EVERYDAY AT BEDTIME   nabumetone (RELAFEN) 500 MG tablet Take 2 tablets (1,000 mg total) by mouth daily as needed.   ondansetron (ZOFRAN-ODT) 4 MG disintegrating tablet Take 1 tablet (4 mg total) by mouth every 8 (eight) hours as needed for nausea or vomiting.   oxyCODONE-acetaminophen (PERCOCET) 7.5-325 MG tablet Take one to 2 tablets every eight hours as needed for pain   predniSONE (DELTASONE) 20 MG tablet TAKE 2 TABLETS (40 MG TOTAL) BY MOUTH DAILY WITH BREAKFAST.   sodium chloride HYPERTONIC 3 % nebulizer solution Use one- 15 mL ampule, up to 4 times daily, as needed, via nebulizer for cough/chest tightness.   Spacer/Aero-Holding Chambers (OPTICHAMBER ADVANTAGE) MISC 1 each by  Other route once. Always uses her when you're using a metered-dose inhaler. You've aromatase medicine as much, he won't have his much side effect, but you it twice as much medicine and your lungs.   triamcinolone cream (KENALOG) 0.5 % Apply 1 Application topically 3 (three) times daily.   No facility-administered medications prior to visit.     Objective    There were no vitals taken for this  visit.  Physical Exam       Assessment & Plan     Assessment and Plan    Sinusitis Right-sided sinus pain for one week. No fever, chills, or nasal discharge. Currently using Flonase. Prior successful treatment with Amoxicillin. -Prescribe [facility] [place] -Advise patient to contact office if symptoms do not improve in a few days.          I discussed the assessment and treatment plan with the patient. The patient was provided an opportunity to ask questions and all were answered. The patient agreed with the plan and demonstrated an understanding of the instructions.   The patient was advised to call back or seek an in-person evaluation if the symptoms worsen or if the condition fails to improve as anticipated.  I provided 8 minutes of non-face-to-face time during this encounter.    Mila Merry, MD [facility] [phone] (phone) [phone] (fax)  [facility]

## 2023-02-27 NOTE — Telephone Encounter (Signed)
Copied from CRM [id number]. Topic: General - Other >> Feb 27, 2023 11:32 AM Phill Myron wrote: Ms. Erin Sherman will be faxing over paper work that need to be completed

## 2023-03-03 DIAGNOSIS — Z0279 Encounter for issue of other medical certificate: Secondary | ICD-10-CM

## 2023-03-07 ENCOUNTER — Telehealth: Payer: Self-pay

## 2023-03-07 NOTE — Telephone Encounter (Signed)
Have the FMLA form been received?

## 2023-03-07 NOTE — Telephone Encounter (Signed)
It was completed 03/03/2023 and faxed today.  Left pt a message advising her that it was faxed today

## 2023-03-07 NOTE — Telephone Encounter (Signed)
Copied from CRM [id number]. Topic: General - Inquiry >> Mar 07, 2023  3:06 PM Patsy Lager T wrote: Reason for CRM: patient called stated her FMLA paperwork is due by September 9th. Patient stated she needs provider to have them completed and faxed by or before the 9th.

## 2023-03-27 ENCOUNTER — Ambulatory Visit: Payer: BC Managed Care – PPO | Admitting: Family Medicine

## 2023-03-27 ENCOUNTER — Encounter: Payer: Self-pay | Admitting: Family Medicine

## 2023-03-27 VITALS — BP 123/68 | HR 89 | Ht 60.0 in | Wt 292.8 lb

## 2023-03-27 DIAGNOSIS — E785 Hyperlipidemia, unspecified: Secondary | ICD-10-CM | POA: Diagnosis not present

## 2023-03-27 DIAGNOSIS — M5136 Other intervertebral disc degeneration, lumbar region: Secondary | ICD-10-CM

## 2023-03-27 DIAGNOSIS — R053 Chronic cough: Secondary | ICD-10-CM

## 2023-03-27 DIAGNOSIS — R5383 Other fatigue: Secondary | ICD-10-CM | POA: Diagnosis not present

## 2023-03-27 DIAGNOSIS — Z23 Encounter for immunization: Secondary | ICD-10-CM

## 2023-03-27 MED ORDER — OXYCODONE-ACETAMINOPHEN 7.5-325 MG PO TABS: ORAL_TABLET | ORAL | 0 refills | Status: DC

## 2023-03-27 MED ORDER — PHENTERMINE HCL 37.5 MG PO CAPS
37.5000 mg | ORAL_CAPSULE | ORAL | 4 refills | Status: DC
Start: 2023-03-27 — End: 2023-08-18

## 2023-03-27 MED ORDER — HYDROCODONE BIT-HOMATROP MBR 5-1.5 MG/5ML PO SOLN
5.0000 mL | Freq: Three times a day (TID) | ORAL | 0 refills | Status: DC | PRN
Start: 2023-03-27 — End: 2023-07-07

## 2023-03-27 NOTE — Progress Notes (Signed)
Established patient visit   Patient: Erin Sherman   DOB: 31-Oct-1965   57 y.o. Female  MRN: [id number] Visit Date: 03/27/2023  Today's healthcare provider: Mila Merry, MD   Chief Complaint  Patient presents with   Fatigue    Has been a problem since July, would also like to discus alternative to wegovy    Medication Refill    Hycodan    Subjective    Discussed the use of AI scribe software for clinical note transcription with the patient, who gave verbal consent to proceed.  History of Present Illness   The patient, with a history of sleep apnea, chronic back pain, astha and obesity presents with increased fatigue over the past few months. She attributes this to a busy schedule of work and school, which leaves her feeling drained and without energy. She reports falling asleep at the computer and struggling to complete housework. She has previously undergone a sleep study and was provided with a nasal CPAP mask, which she found unsuitable due to sinus and allergy issues. She expresses a need for a full face mask but has had difficulty obtaining one.  The patient also reports weight gain, which she has previously managed with a combination of phentermine and Wegovy injections. She had lost 30 pounds with this regimen but is seeking an alternative to [place] due to insurance coverage issues. She expresses willingness to try other weight loss medications if covered by her insurance.  The patient has also experienced increased pain, having fallen three times on her right side since returning from [place] in August. She has been taking more of her pain medication and using topical creams and patches for relief. She has previously received injections in her hip and spine for pain management but found the relief to be temporary and the procedure painful.  The patient also reports a persistent cough and difficulty breathing, which she believes is due to inflammation in her lungs. She  has been managing this with Mucinex and a prescribed cough medicine, both of which she is seeking refills for, is followed by Dr. Belia Heman       Medications: Outpatient Medications Prior to Visit  Medication Sig   albuterol (VENTOLIN HFA) 108 (90 Base) MCG/ACT inhaler TAKE 2 PUFFS BY MOUTH EVERY 6 HOURS AS NEEDED   azelastine (ASTELIN) 0.1 % nasal spray Place 2 sprays into both nostrils 2 (two) times daily. Use in each nostril as directed   calcium carbonate (TUMS - DOSED IN MG ELEMENTAL CALCIUM) 500 MG chewable tablet Chew 1 tablet by mouth daily as needed for indigestion.   cholecalciferol (VITAMIN D) 1000 UNITS tablet Take 1,000 Units by mouth daily.   EPINEPHRINE 0.3 mg/0.3 mL IJ SOAJ injection INJECT 0.3 MLS (0.3 MG TOTAL) INTO THE MUSCLE AS NEEDED. WITH ALLERGIC REACTION   fluticasone (FLONASE) 50 MCG/ACT nasal spray SPRAY 2 SPRAYS INTO EACH NOSTRIL EVERY DAY   Fluticasone-Umeclidin-Vilant (TRELEGY ELLIPTA) 200-62.5-25 MCG/ACT AEPB Inhale 1 puff into the lungs daily.   Fluticasone-Umeclidin-Vilant (TRELEGY ELLIPTA) 200-62.5-25 MCG/ACT AEPB Inhale 1 Act into the lungs daily.   ipratropium-albuterol (DUONEB) 0.5-2.5 (3) MG/3ML SOLN INHALE 3 MLS INTO THE LUNGS EVERY 6 (SIX) HOURS AS NEEDED. REPORTED ON 12/28/2015   levalbuterol (XOPENEX HFA) 45 MCG/ACT inhaler Inhale 2 puffs into the lungs every 6 (six) hours as needed for wheezing. May substitute generic   montelukast (SINGULAIR) 10 MG tablet TAKE 1 TABLET BY MOUTH EVERYDAY AT BEDTIME   nabumetone (RELAFEN) 500 MG  tablet Take 2 tablets (1,000 mg total) by mouth daily as needed.   ondansetron (ZOFRAN-ODT) 4 MG disintegrating tablet Take 1 tablet (4 mg total) by mouth every 8 (eight) hours as needed for nausea or vomiting.   sodium chloride HYPERTONIC 3 % nebulizer solution Use one- 15 mL ampule, up to 4 times daily, as needed, via nebulizer for cough/chest tightness.   Spacer/Aero-Holding Chambers (OPTICHAMBER ADVANTAGE) MISC 1 each by Other route  once. Always uses her when you're using a metered-dose inhaler. You've aromatase medicine as much, he won't have his much side effect, but you it twice as much medicine and your lungs.   triamcinolone cream (KENALOG) 0.5 % Apply 1 Application topically 3 (three) times daily.   [DISCONTINUED] oxyCODONE-acetaminophen (PERCOCET) 7.5-325 MG tablet Take one to 2 tablets every eight hours as needed for pain   aspirin 81 MG EC tablet Take 1 tablet (81 mg total) by mouth daily. Swallow whole. (Patient not taking: Reported on 03/27/2023)   Cetirizine HCl (ZYRTEC PO) Take by mouth. (Patient not taking: Reported on 03/27/2023)   predniSONE (DELTASONE) 20 MG tablet TAKE 2 TABLETS (40 MG TOTAL) BY MOUTH DAILY WITH BREAKFAST. (Patient not taking: Reported on 03/27/2023)   [DISCONTINUED] HYDROcodone bit-homatropine (HYCODAN) 5-1.5 MG/5ML syrup Take 5 mLs by mouth every 8 (eight) hours as needed for cough. (Patient not taking: Reported on 03/27/2023)   No facility-administered medications prior to visit.   Review of Systems     Objective    BP 123/68 (BP Location: Left Arm, Patient Position: Sitting, Cuff Size: Large)   Pulse 89   Ht 5' (1.524 m)   Wt 292 lb 12.8 oz (132.8 kg)   SpO2 100%   BMI 57.18 kg/m    Physical Exam   CHEST: lungs clear to auscultation    No results found for any visits on 03/27/23.  Assessment & Plan        Obstructive Sleep Apnea Patient reports fatigue and difficulty sleeping. Currently not using CPAP due to issues with nasal mask. -she does have face mask from previous CPAP machine which she is going to sterilize and see if she can hook up to new CPAP machine.  -Consider follow-up with sleep specialist for new mask prescription.  Weight Management Patient reports previous success with combination of phentermine and Wegovy injections, but insurance does not cover [id number]. -Resume phentermine. -she is not interested in combination therapy with naltrexone, bupropion  topiramate due to potential side effects.  Chronic Pain Patient reports increased pain after recent falls, currently managed with oxycodone and ibuprofen. -Continue current pain management regimen. -Advise patient not to take oxycodone with cough medicine due to risk of excessive sedation.  Chronic Cough Patient reports persistent cough and has been using Mucinex and prescription cough medicine. -Refill prescription cough medicine.  General Health Maintenance -Administer influenza vaccine today. -Order labs to check thyroid, B12, and vitamin D levels.    No follow-ups on file.      Mila Merry, MD  [facility] [phone] (phone) [phone] (fax)  [facility]

## 2023-03-30 LAB — CBC
Hematocrit: 35.6 % (ref 34.0–46.6)
Hemoglobin: 11.1 g/dL (ref 11.1–15.9)
MCH: 27.3 pg (ref 26.6–33.0)
MCHC: 31.2 g/dL — ABNORMAL LOW (ref 31.5–35.7)
MCV: 88 fL (ref 79–97)
Platelets: 277 10*3/uL (ref 150–450)
RBC: 4.07 x10E6/uL (ref 3.77–5.28)
RDW: 12 % (ref 11.7–15.4)
WBC: 7.2 10*3/uL (ref 3.4–10.8)

## 2023-03-30 LAB — COMPREHENSIVE METABOLIC PANEL
ALT: 27 IU/L (ref 0–32)
AST: 23 IU/L (ref 0–40)
Albumin: 4.3 g/dL (ref 3.8–4.9)
Alkaline Phosphatase: 89 IU/L (ref 44–121)
BUN/Creatinine Ratio: 14 (ref 9–23)
BUN: 8 mg/dL (ref 6–24)
Bilirubin Total: 0.2 mg/dL (ref 0.0–1.2)
CO2: 21 mmol/L (ref 20–29)
Calcium: 8.8 mg/dL (ref 8.7–10.2)
Chloride: 103 mmol/L (ref 96–106)
Creatinine, Ser: 0.58 mg/dL (ref 0.57–1.00)
Globulin, Total: 2.8 g/dL (ref 1.5–4.5)
Glucose: 111 mg/dL — ABNORMAL HIGH (ref 70–99)
Potassium: 4.2 mmol/L (ref 3.5–5.2)
Sodium: 137 mmol/L (ref 134–144)
Total Protein: 7.1 g/dL (ref 6.0–8.5)
eGFR: 106 mL/min/{1.73_m2} (ref >59)

## 2023-03-30 LAB — TSH: TSH: 1.07 u[IU]/mL (ref 0.450–4.500)

## 2023-03-30 LAB — MAGNESIUM: Magnesium: 2.1 mg/dL (ref 1.6–2.3)

## 2023-03-30 LAB — VITAMIN B12: Vitamin B-12: 809 pg/mL (ref 232–1245)

## 2023-03-30 LAB — T4, FREE: Free T4: 1.08 ng/dL (ref 0.82–1.77)

## 2023-03-30 LAB — VITAMIN D 25 HYDROXY (VIT D DEFICIENCY, FRACTURES): Vit D, 25-Hydroxy: 18.3 ng/mL — ABNORMAL LOW (ref 30.0–100.0)

## 2023-04-14 ENCOUNTER — Telehealth: Payer: Self-pay | Admitting: Family Medicine

## 2023-04-14 NOTE — Telephone Encounter (Signed)
faxed

## 2023-04-14 NOTE — Telephone Encounter (Signed)
Patient called requesting a copy of her flu shot be faxed to [phone].

## 2023-04-14 NOTE — Telephone Encounter (Signed)
Patient called back to have a copy of her flu shot faxed to her at [phone] before she leaves work at [employer] today

## 2023-04-19 ENCOUNTER — Encounter: Payer: Self-pay | Admitting: Physician Assistant

## 2023-04-19 ENCOUNTER — Ambulatory Visit (INDEPENDENT_AMBULATORY_CARE_PROVIDER_SITE_OTHER): Payer: BC Managed Care – PPO | Admitting: Physician Assistant

## 2023-04-19 VITALS — BP 129/75 | HR 90 | Wt 284.6 lb

## 2023-04-19 DIAGNOSIS — M1990 Unspecified osteoarthritis, unspecified site: Secondary | ICD-10-CM

## 2023-04-19 DIAGNOSIS — K219 Gastro-esophageal reflux disease without esophagitis: Secondary | ICD-10-CM

## 2023-04-19 DIAGNOSIS — M25561 Pain in right knee: Secondary | ICD-10-CM

## 2023-04-19 MED ORDER — KETOROLAC TROMETHAMINE 60 MG/2ML IM SOLN
30.0000 mg | Freq: Once | INTRAMUSCULAR | Status: DC
Start: 2023-04-19 — End: 2023-04-19

## 2023-04-19 MED ORDER — OMEPRAZOLE 40 MG PO CPDR
40.0000 mg | DELAYED_RELEASE_CAPSULE | Freq: Every day | ORAL | 3 refills | Status: DC
Start: 2023-04-19 — End: 2023-06-13

## 2023-04-19 MED ORDER — KETOROLAC TROMETHAMINE 60 MG/2ML IM SOLN
30.0000 mg | Freq: Once | INTRAMUSCULAR | Status: AC
Start: 2023-04-19 — End: 2023-04-19
  Administered 2023-04-19: 30 mg via INTRAMUSCULAR

## 2023-04-19 MED ORDER — KETOROLAC TROMETHAMINE 30 MG/ML IJ SOLN
30.0000 mg | Freq: Once | INTRAMUSCULAR | Status: DC
Start: 2023-04-19 — End: 2023-04-19

## 2023-04-19 NOTE — Progress Notes (Addendum)
Established patient visit  Patient: Erin Sherman   DOB: 1966/02/01   57 y.o. Female  MRN: [id number] Visit Date: 04/19/2023  Today's healthcare provider: Debera Lat, PA-C   Chief Complaint  Patient presents with   Knee Pain    Located on right side since July-August after walking around [place] the [place]. Associated with leg swelling and right hip pain. Reports pain as sharp pain shooting down her leg.   Subjective     Discussed the use of AI scribe software for clinical note transcription with the patient, who gave verbal consent to proceed.  History of Present Illness   The patient, with a history of arthritis, presents with acute pain in the hip and knee following a recent fall. The patient reports a cracking or popping sound in the knee when walking, which is associated with significant pain and fear of further injury. The pain also radiates down the leg. The patient notes that the affected leg is swollen and larger than the other leg. The patient has previously taken oral steroids and has had a Toradol injection in the past for pain management. The patient is currently on medication for sinus issues, asthma, and acid reflux.           02/25/2022    1:24 PM 10/11/2021    1:56 PM 09/01/2021    9:24 AM  Depression screen PHQ 2/9  Decreased Interest 0 0 0  Down, Depressed, Hopeless 0 0 0  PHQ - 2 Score 0 0 0  Altered sleeping 0 0 1  Tired, decreased energy 0 3 2  Change in appetite 0 0 0  Feeling bad or failure about yourself  0 0 0  Trouble concentrating 0 0 0  Moving slowly or fidgety/restless 0 0 0  Suicidal thoughts 0 0 0  PHQ-9 Score 0 3 3  Difficult doing work/chores Not difficult at all Not difficult at all Very difficult       No data to display          Medications: Outpatient Medications Prior to Visit  Medication Sig   albuterol (VENTOLIN HFA) 108 (90 Base) MCG/ACT inhaler TAKE 2 PUFFS BY MOUTH EVERY 6 HOURS AS NEEDED   aspirin 81 MG EC tablet  Take 1 tablet (81 mg total) by mouth daily. Swallow whole. (Patient not taking: Reported on 03/27/2023)   azelastine (ASTELIN) 0.1 % nasal spray Place 2 sprays into both nostrils 2 (two) times daily. Use in each nostril as directed   calcium carbonate (TUMS - DOSED IN MG ELEMENTAL CALCIUM) 500 MG chewable tablet Chew 1 tablet by mouth daily as needed for indigestion.   Cetirizine HCl (ZYRTEC PO) Take by mouth. (Patient not taking: Reported on 03/27/2023)   cholecalciferol (VITAMIN D) 1000 UNITS tablet Take 1,000 Units by mouth daily.   EPINEPHRINE 0.3 mg/0.3 mL IJ SOAJ injection INJECT 0.3 MLS (0.3 MG TOTAL) INTO THE MUSCLE AS NEEDED. WITH ALLERGIC REACTION   fluticasone (FLONASE) 50 MCG/ACT nasal spray SPRAY 2 SPRAYS INTO EACH NOSTRIL EVERY DAY   Fluticasone-Umeclidin-Vilant (TRELEGY ELLIPTA) 200-62.5-25 MCG/ACT AEPB Inhale 1 puff into the lungs daily.   Fluticasone-Umeclidin-Vilant (TRELEGY ELLIPTA) 200-62.5-25 MCG/ACT AEPB Inhale 1 Act into the lungs daily.   HYDROcodone bit-homatropine (HYCODAN) 5-1.5 MG/5ML syrup Take 5 mLs by mouth every 8 (eight) hours as needed for cough.   ipratropium-albuterol (DUONEB) 0.5-2.5 (3) MG/3ML SOLN INHALE 3 MLS INTO THE LUNGS EVERY 6 (SIX) HOURS AS NEEDED. REPORTED ON 12/28/2015   levalbuterol [facility]  HFA) 45 MCG/ACT inhaler Inhale 2 puffs into the lungs every 6 (six) hours as needed for wheezing. May substitute generic   montelukast (SINGULAIR) 10 MG tablet TAKE 1 TABLET BY MOUTH EVERYDAY AT BEDTIME   ondansetron (ZOFRAN-ODT) 4 MG disintegrating tablet Take 1 tablet (4 mg total) by mouth every 8 (eight) hours as needed for nausea or vomiting.   oxyCODONE-acetaminophen (PERCOCET) 7.5-325 MG tablet Take one to 2 tablets every eight hours as needed for pain   phentermine 37.5 MG capsule Take 1 capsule (37.5 mg total) by mouth every morning. DO NOT TAKE THIS MEDICATION WHEN TAKING ZYRTEC-D   predniSONE (DELTASONE) 20 MG tablet TAKE 2 TABLETS (40 MG TOTAL) BY MOUTH  DAILY WITH BREAKFAST. (Patient not taking: Reported on 03/27/2023)   sodium chloride HYPERTONIC 3 % nebulizer solution Use one- 15 mL ampule, up to 4 times daily, as needed, via nebulizer for cough/chest tightness.   Spacer/Aero-Holding Chambers (OPTICHAMBER ADVANTAGE) MISC 1 each by Other route once. Always uses her when you're using a metered-dose inhaler. You've aromatase medicine as much, he won't have his much side effect, but you it twice as much medicine and your lungs.   triamcinolone cream (KENALOG) 0.5 % Apply 1 Application topically 3 (three) times daily.   [DISCONTINUED] nabumetone (RELAFEN) 500 MG tablet Take 2 tablets (1,000 mg total) by mouth daily as needed.   No facility-administered medications prior to visit.    Review of Systems  All other systems reviewed and are negative.  Except see HPI       Objective    BP 129/75 (BP Location: Right Arm, Patient Position: Sitting, Cuff Size: Large)   Pulse 90   Wt 284 lb 9.6 oz (129.1 kg)   BMI 55.58 kg/m     Physical Exam Vitals reviewed.  Constitutional:      General: She is not in acute distress.    Appearance: Normal appearance. She is well-developed. She is not diaphoretic.  HENT:     Head: Normocephalic and atraumatic.  Eyes:     General: No scleral icterus.    Conjunctiva/sclera: Conjunctivae normal.  Neck:     Thyroid: No thyromegaly.  Cardiovascular:     Rate and Rhythm: Normal rate and regular rhythm.     Pulses: Normal pulses.     Heart sounds: Normal heart sounds. No murmur heard. Pulmonary:     Effort: Pulmonary effort is normal. No respiratory distress.     Breath sounds: Normal breath sounds. No wheezing, rhonchi or rales.  Musculoskeletal:        General: Swelling (right knee) and tenderness (right knee) present.     Cervical back: Neck supple.     Right lower leg: No edema.     Left lower leg: No edema.  Lymphadenopathy:     Cervical: No cervical adenopathy.  Skin:    General: Skin is warm  and dry.     Findings: No rash.  Neurological:     Mental Status: She is alert and oriented to person, place, and time. Mental status is at baseline.  Psychiatric:        Mood and Affect: Mood normal.        Behavior: Behavior normal.      No results found for any visits on 04/19/23.  Assessment & Plan        Acute Right Knee Pain Recent fall with subsequent knee pain, swelling, and popping sounds. Advised to do xr, pat declined today. Discussed the possibility of exacerbation  of underlying arthritis and potential need for MRI if symptoms persist. -Administered Toradol injection for pain management. -Advised to elevate the leg, limit overuse, and perform gentle stretches when pain subsides. Rest and cold compresses. -Recommended over-the-counter Tylenol for additional pain management, avoiding NSAIDs due to patient's acid reflux.  Acid Reflux Discussed the potential for NSAIDs to exacerbate acid reflux. -Prescribed Omeprazole to manage acid reflux, advised to start with half a tablet and adjust as needed.  In the setting of Arthritis Chronic condition, potentially exacerbated by recent fall. -Continue current management plan.  General Health Maintenance / Followup Plans -Return to clinic if symptoms persist or worsen.      No follow-ups on file.     The patient was advised to call back or seek an in-person evaluation if the symptoms worsen or if the condition fails to improve as anticipated.  I discussed the assessment and treatment plan with the patient. The patient was provided an opportunity to ask questions and all were answered. The patient agreed with the plan and demonstrated an understanding of the instructions.  I, Debera Lat, PA-C have reviewed all documentation for this visit. The documentation on  04/19/23  for the exam, diagnosis, procedures, and orders are all accurate and complete.  Debera Lat, [facility], MMS [facility] [phone]  (phone) [phone] (fax)  [facility]

## 2023-04-28 ENCOUNTER — Other Ambulatory Visit: Payer: Self-pay | Admitting: Family Medicine

## 2023-04-28 DIAGNOSIS — J45909 Unspecified asthma, uncomplicated: Secondary | ICD-10-CM

## 2023-06-12 ENCOUNTER — Other Ambulatory Visit: Payer: Self-pay | Admitting: Family Medicine

## 2023-06-12 ENCOUNTER — Other Ambulatory Visit: Payer: Self-pay | Admitting: Physician Assistant

## 2023-06-12 DIAGNOSIS — M25561 Pain in right knee: Secondary | ICD-10-CM

## 2023-06-12 DIAGNOSIS — J4540 Moderate persistent asthma, uncomplicated: Secondary | ICD-10-CM

## 2023-06-12 DIAGNOSIS — J4541 Moderate persistent asthma with (acute) exacerbation: Secondary | ICD-10-CM

## 2023-06-14 ENCOUNTER — Other Ambulatory Visit: Payer: Self-pay | Admitting: Family Medicine

## 2023-06-14 DIAGNOSIS — M51369 Other intervertebral disc degeneration, lumbar region without mention of lumbar back pain or lower extremity pain: Secondary | ICD-10-CM

## 2023-06-14 DIAGNOSIS — R053 Chronic cough: Secondary | ICD-10-CM

## 2023-06-14 NOTE — Telephone Encounter (Signed)
Spoke to patient about the refill request for Hydrocodone bit-homatropine syrup. Patient states she is prescribed the medication routinely for her chronic cough related to asthma. Patient stated she always calls the office for a new prescription when she runs out. Patient declined triage.

## 2023-06-14 NOTE — Telephone Encounter (Signed)
Medication Refill -  Most Recent Primary Care Visit:  Provider: Debera Lat  Department: [facility]  Visit Type: SAME DAY  Date: 04/19/2023  Medication: HYDROcodone bit-homatropine (HYCODAN) 5-1.5 MG/5ML syrup/oxyCODONE-acetaminophen (PERCOCET) 7.5-325 MG tablet   Has the patient contacted their pharmacy? no Pt thought she had to cal in since it said no refills, I let her know she can still contact the pharmacy  Is this the correct pharmacy for this prescription? yes  This is the patient's preferred pharmacy:  [id number] - [place], [place] - [address] [place] [address] Phone: [phone] Fax: [phone]   Has the prescription been filled recently? no  Is the patient out of the medication? yes  Has the patient been seen for an appointment in the last year OR does the patient have an upcoming appointment? yes  Can we respond through MyChart? yes  Agent: Please be advised that Rx refills may take up to 3 business days. We ask that you follow-up with your pharmacy.

## 2023-06-15 ENCOUNTER — Telehealth: Payer: Self-pay | Admitting: Family Medicine

## 2023-06-15 NOTE — Telephone Encounter (Signed)
Patient called to f/u on the refill request for HYDROcodone bit-homatropine (HYCODAN) 5-1.5 MG/5ML syrup and oxyCODONE-acetaminophen (PERCOCET) 7.5-325 MG tablet. Advised patient the request was sent to provider and to give 48-72hrs

## 2023-06-15 NOTE — Telephone Encounter (Signed)
Requested medications are due for refill today.  yes  Requested medications are on the active medications list.  yes  Last refill. 03/27/2023 for both  Future visit scheduled.   yes  Notes to clinic.  Refills not delegated.    Requested Prescriptions  Pending Prescriptions Disp Refills   HYDROcodone bit-homatropine (HYCODAN) 5-1.5 MG/5ML syrup 120 mL 0    Sig: Take 5 mLs by mouth every 8 (eight) hours as needed for cough.     Off-Protocol Failed - 06/15/2023  7:47 AM      Failed - Medication not assigned to a protocol, review manually.      Passed - Valid encounter within last 12 months    Recent Outpatient Visits           1 month ago Acute pain of right knee   [facility] [place], [place], PA-C   2 months ago Other fatigue   [facility] Malva Limes, MD   3 months ago Sinusitis, unspecified chronicity, unspecified location   [facility] Malva Limes, MD   6 months ago Chronic asthma without complication, unspecified asthma severity, unspecified whether persistent   [facility] Malva Limes, MD   8 months ago Abdominal bloating   [facility] Malva Limes, MD       Future Appointments             In 3 weeks Fisher, Demetrios Isaacs, MD [facility]             oxyCODONE-acetaminophen (PERCOCET) 7.5-325 MG tablet 30 tablet 0    Sig: Take one to 2 tablets every eight hours as needed for pain     Not Delegated - Analgesics:  Opioid Agonist Combinations Failed - 06/15/2023  7:47 AM      Failed - This refill cannot be delegated      Failed - Urine Drug Screen completed in last 360 days      Passed - Valid encounter within last 3 months    Recent Outpatient Visits           1 month ago Acute pain of right knee   [facility] [place], [place], PA-C   2 months ago Other  fatigue   [facility] Malva Limes, MD   3 months ago Sinusitis, unspecified chronicity, unspecified location   [facility] Malva Limes, MD   6 months ago Chronic asthma without complication, unspecified asthma severity, unspecified whether persistent   [facility] Malva Limes, MD   8 months ago Abdominal bloating   [facility] Malva Limes, MD       Future Appointments             In 3 weeks Fisher, Demetrios Isaacs, MD [facility], [facility]

## 2023-06-19 ENCOUNTER — Telehealth: Payer: Self-pay | Admitting: Family Medicine

## 2023-06-19 MED ORDER — OXYCODONE-ACETAMINOPHEN 7.5-325 MG PO TABS: ORAL_TABLET | ORAL | 0 refills | Status: DC

## 2023-06-19 NOTE — Telephone Encounter (Signed)
Received fax from [employer] for Oxycodone-Acet. 7.5mg -325 mg.   Key: [id number]

## 2023-06-20 ENCOUNTER — Telehealth: Payer: Self-pay

## 2023-06-20 DIAGNOSIS — M25561 Pain in right knee: Secondary | ICD-10-CM

## 2023-06-20 NOTE — Telephone Encounter (Signed)
Copied from CRM [id number]. Topic: Appointment Scheduling - Scheduling Inquiry for Clinic >> Jun 19, 2023  4:45 PM Marlow Baars wrote: Reason for CRM: The patient saw Edmon Crape about a month ago for right knee pain but she is still in pain and would like for her provider to order and x ray of her knee. She would like it to be in the afternoon. Please assist patient further

## 2023-06-20 NOTE — Telephone Encounter (Signed)
Pt notified and address given

## 2023-06-21 ENCOUNTER — Ambulatory Visit
Admission: RE | Admit: 2023-06-21 | Discharge: 2023-06-21 | Disposition: A | Discharge status: Home / Self Care | Payer: BC Managed Care – PPO | Source: Ambulatory Visit | Attending: Physician Assistant

## 2023-06-21 DIAGNOSIS — M25561 Pain in right knee: Secondary | ICD-10-CM

## 2023-06-23 NOTE — Telephone Encounter (Signed)
PA sent to plan

## 2023-06-29 ENCOUNTER — Telehealth: Payer: Self-pay | Admitting: *Deleted

## 2023-06-29 NOTE — Telephone Encounter (Signed)
Pt requesting xray results of knee done on 06/21/23. No documentation noted from results at this time. Please advise and patient would like a call back and "something done with this knee".

## 2023-07-03 NOTE — Telephone Encounter (Signed)
  Dear Ms Sennett, there are no evidence of fracture, dislocation, or joint effusion but some progression of osteoarthritis noted since the prior xray.  Written by Debera Lat, PA-C on 07/01/2023  8:25 PM EST

## 2023-07-04 ENCOUNTER — Other Ambulatory Visit: Payer: Self-pay | Admitting: Family Medicine

## 2023-07-04 DIAGNOSIS — J4541 Moderate persistent asthma with (acute) exacerbation: Secondary | ICD-10-CM

## 2023-07-07 ENCOUNTER — Ambulatory Visit: Payer: 59 | Admitting: Family Medicine

## 2023-07-07 ENCOUNTER — Ambulatory Visit: Payer: Self-pay

## 2023-07-07 VITALS — BP 131/73 | HR 95 | Resp 20 | Ht 60.0 in | Wt 282.0 lb

## 2023-07-07 DIAGNOSIS — Z515 Encounter for palliative care: Secondary | ICD-10-CM | POA: Insufficient documentation

## 2023-07-07 DIAGNOSIS — M25561 Pain in right knee: Secondary | ICD-10-CM

## 2023-07-07 MED ORDER — OXYCODONE-ACETAMINOPHEN 10-325 MG PO TABS: 1.0000 | ORAL_TABLET | Freq: Three times a day (TID) | ORAL | 0 refills | Status: DC | PRN

## 2023-07-07 NOTE — Telephone Encounter (Signed)
 Pt seen on 07-07-2023

## 2023-07-07 NOTE — Telephone Encounter (Signed)
  Chief Complaint: swelling and severe pain to right knee across the left to right side in line with kneecap Symptoms: swelling, pain radiating to bottom of right foot Frequency: months worse. Oxy not helping Pertinent Negatives: Patient denies redness  Disposition: [] ED /[] Urgent Care (no appt availability in office) / [x] Appointment(In office/virtual)/ []  [facility]/ [] Home Care/ [] Refused Recommended Disposition /[] [facility]/ []  Follow-up with PCP Additional Notes: pt given appt today Reason for Disposition  [1] SEVERE pain (e.g., excruciating, unable to walk) AND [2] not improved after 2 hours of pain medicine  Answer Assessment - Initial Assessment Questions 1. LOCATION and RADIATION: Where is the pain located?      Right knee 2. QUALITY: What does the pain feel like?  (e.g., sharp, dull, aching, burning)     Sharp left side of knee when bending radiates to right side of knee shoots to bottom of foot 3. SEVERITY: How bad is the pain? What does it keep you from doing?   (Scale 1-10; or mild, moderate, severe)   -  MILD (1-3): doesn't interfere with normal activities    -  MODERATE (4-7): interferes with normal activities (e.g., work or school) or awakens from sleep, limping    -  SEVERE (8-10): excruciating pain, unable to do any normal activities, unable to walk     10 4. ONSET: When did the pain start? Does it come and go, or is it there all the time?     Few pops 5. RECURRENT: Have you had this pain before? If Yes, ask: When, and what happened then?     Ongoing for months 6. SETTING: Has there been any recent work, exercise or other activity that involved that part of the body?      Works in a hospital  7. AGGRAVATING FACTORS: What makes the knee pain worse? (e.g., walking, climbing stairs, running)     H/o of 3-4 falls form the knee giving out 8. ASSOCIATED SYMPTOMS: Is there any swelling or redness of the knee?     Swollen,  cracking and popping  9. OTHER SYMPTOMS: Do you have any other symptoms? (e.g., chest pain, difficulty breathing, fever, calf pain)     Preventing you  from sleeping and pain med not helping  Protocols used: Knee Pain-A-AH

## 2023-07-07 NOTE — Progress Notes (Signed)
 Established patient visit   Patient: Erin Sherman   DOB: 04-Jul-1966   58 y.o. Female  MRN: [id number] Visit Date: 07/07/2023  Today's healthcare provider: Nancyann Perry, MD   Chief Complaint  Patient presents with   Knee Pain   Subjective    Discussed the use of AI scribe software for clinical note transcription with the patient, who gave verbal consent to proceed.  History of Present Illness   The patient presents with worsening right knee pain and swelling over the past month, following multiple falls. The pain is severe, described as a burning sensation, particularly when moving the knee in certain directions. The patient also reports a clicking sound and sensation in the knee during movement, which sometimes causes instability and fear of falling. The pain is constant, severe enough to wake the patient at night, and is not relieved by current pain management strategies, including oxycodone /apap 7.5 and over-the-counter Tylenol . The patient has also tried topical pain patches and creams, with minimal relief. The patient has found some relief from applying heat to the knee, but cold exacerbates the pain. The patient's pain is so severe that it is difficult to examine the knee without causing significant discomfort. The patient is eager to see an orthopedist as soon as possible for further evaluation and treatment.  she was seen by Jolynn Spencer, PA-C in December and had xrays remarkable only for osteoarthritis.       Medications: Outpatient Medications Prior to Visit  Medication Sig   albuterol  (VENTOLIN  HFA) 108 (90 Base) MCG/ACT inhaler INHALE 2 PUFFS BY MOUTH EVERY 6 HOURS AS NEEDED   azelastine  (ASTELIN ) 0.1 % nasal spray Place 2 sprays into both nostrils 2 (two) times daily. Use in each nostril as directed   benzonatate  (TESSALON ) 200 MG capsule TAKE 1 CAPSULE (200 MG TOTAL) BY MOUTH 3 (THREE) TIMES DAILY AS NEEDED FOR COUGH.   calcium carbonate (TUMS - DOSED IN MG  ELEMENTAL CALCIUM) 500 MG chewable tablet Chew 1 tablet by mouth daily as needed for indigestion.   cholecalciferol (VITAMIN D ) 1000 UNITS tablet Take 1,000 Units by mouth daily.   fluticasone  (FLONASE ) 50 MCG/ACT nasal spray SPRAY 2 SPRAYS INTO EACH NOSTRIL EVERY DAY   Fluticasone -Umeclidin-Vilant (TRELEGY ELLIPTA ) 200-62.5-25 MCG/ACT AEPB Inhale 1 puff into the lungs daily.   Fluticasone -Umeclidin-Vilant (TRELEGY ELLIPTA ) 200-62.5-25 MCG/ACT AEPB Inhale 1 Act into the lungs daily.   ipratropium-albuterol  (DUONEB) 0.5-2.5 (3) MG/3ML SOLN INHALE 3 MLS INTO THE LUNGS EVERY 6 (SIX) HOURS AS NEEDED. REPORTED ON 12/28/2015   levalbuterol  (XOPENEX  HFA) 45 MCG/ACT inhaler Inhale 2 puffs into the lungs every 6 (six) hours as needed for wheezing. May substitute generic   loratadine (CLARITIN) 10 MG tablet Take 10 mg by mouth daily.   montelukast  (SINGULAIR ) 10 MG tablet TAKE 1 TABLET BY MOUTH EVERYDAY AT BEDTIME   phentermine  37.5 MG capsule Take 1 capsule (37.5 mg total) by mouth every morning. DO NOT TAKE THIS MEDICATION WHEN TAKING ZYRTEC -D   sodium chloride  HYPERTONIC 3 % nebulizer solution Use one- 15 mL ampule, up to 4 times daily, as needed, via nebulizer for cough/chest tightness.   Spacer/Aero-Holding Chambers (OPTICHAMBER ADVANTAGE) MISC 1 each by Other route once. Always uses her when you're using a metered-dose inhaler. You've aromatase medicine as much, he won't have his much side effect, but you it twice as much medicine and your lungs.   triamcinolone  cream (KENALOG ) 0.5 % Apply 1 Application topically 3 (three) times daily.   [  DISCONTINUED] oxyCODONE -acetaminophen  (PERCOCET) 7.5-325 MG tablet Take one to 2 tablets every eight hours as needed for pain   EPINEPHRINE  0.3 mg/0.3 mL IJ SOAJ injection INJECT 0.3 MLS (0.3 MG TOTAL) INTO THE MUSCLE AS NEEDED. WITH ALLERGIC REACTION   omeprazole  (PRILOSEC) 40 MG capsule TAKE 1 CAPSULE (40 MG TOTAL) BY MOUTH DAILY. (Patient not taking: Reported on  07/07/2023)   ondansetron  (ZOFRAN -ODT) 4 MG disintegrating tablet Take 1 tablet (4 mg total) by mouth every 8 (eight) hours as needed for nausea or vomiting. (Patient not taking: Reported on 07/07/2023)   [DISCONTINUED] aspirin  81 MG EC tablet Take 1 tablet (81 mg total) by mouth daily. Swallow whole. (Patient not taking: Reported on 03/27/2023)   [DISCONTINUED] Cetirizine  HCl (ZYRTEC  PO) Take by mouth. (Patient not taking: Reported on 03/27/2023)   [DISCONTINUED] HYDROcodone  bit-homatropine (HYCODAN) 5-1.5 MG/5ML syrup Take 5 mLs by mouth every 8 (eight) hours as needed for cough. (Patient not taking: Reported on 07/07/2023)   No facility-administered medications prior to visit.   Review of Systems     Objective    BP 131/73   Pulse 95   Resp 20   Ht 5' (1.524 m)   Wt 282 lb (127.9 kg)   SpO2 96%   BMI 55.07 kg/m   Physical Exam  Diffuse moderate tenderness over entire knee, particularly along lateral and medial aspects of knee. Exam limited by severe obesity of leg. No erythema of knee. Not warm to the touch.   Assessment & Plan        Right Knee Pain Severe pain and swelling, possibly due to osteoarthritis or a torn ligament. Pain is not adequately controlled with current medication (Oxycodone ).  -Increase dose of Oxycodone /apap to 10/325 and [facility] [place]. -Refer to Orthopedist for further evaluation and management, with urgency due to severity of pain. she has been on many different NSAIDs which she is not interested in trying again.         Nancyann Perry, MD  [facility] [phone] (phone) [phone] (fax)  [facility]

## 2023-07-10 ENCOUNTER — Telehealth: Payer: Self-pay | Admitting: Family Medicine

## 2023-07-10 NOTE — Telephone Encounter (Signed)
 Recieved a fax from covermymeds for Oxycodone-Acetaminophen 10-325MG  Tablets PA has been started   key: [id number]

## 2023-07-11 NOTE — Telephone Encounter (Signed)
 PA submitted in Cover My Meds for Oxycodone-Acetaminophen 10-325  LOANA SALVAGGIO (Key: [id number]) Rx #: [phone]

## 2023-07-11 NOTE — Telephone Encounter (Signed)
 Recieved a fax from covermymeds for Oxycodone-Acetaminophen 10-325MG  Tablets..Patient is waiting for their medication.   key: [id number]

## 2023-07-12 ENCOUNTER — Ambulatory Visit: Payer: BC Managed Care – PPO | Admitting: Family Medicine

## 2023-07-17 ENCOUNTER — Ambulatory Visit: Payer: Self-pay | Admitting: *Deleted

## 2023-07-17 DIAGNOSIS — R053 Chronic cough: Secondary | ICD-10-CM

## 2023-07-17 NOTE — Telephone Encounter (Signed)
  Chief Complaint: cough , requesting cough syrup. Previous medications not working  Symptoms: coughing spells interferes with working. Face pain left ear pain blowing nose, coughing makes side hurt. Mucinex  , tessalon  not working for cough.  Frequency: few days Pertinent Negatives: Patient denies chest pain no difficulty breathing no fever not SOB Disposition: [] ED /[] Urgent Care (no appt availability in office) / [] Appointment(In office/virtual)/ []  [place] [facility]/ [] Home Care/ [] Refused Recommended Disposition /[] [place] [facility]/ [x]  Follow-up with PCP Additional Notes:   Patient requesting for cough syrup. Reports she is trying not to take steroids for continued cough. Please advise if appt needed. Patient reports PCP aware of sx. Last medication filled  07/04/23.  Summary: Cough & congestion - rx not working   Pt state she still has the congestion and cough. Pt has taken mucinex , and used the tessalon  pearls and has not had any relief. Pt also has asthma denies SOB.  Pt seeking clinical advice                Reason for Disposition  [1] Continuous (nonstop) coughing interferes with work or school AND [2] no improvement using cough treatment per Care Advice  Answer Assessment - Initial Assessment Questions 1. ONSET: When did the cough begin?      Few days 2. SEVERITY: How bad is the cough today?      Coughing spells at times.  3. SPUTUM: Describe the color of your sputum (none, dry cough; clear, white, yellow, green)     na 4. HEMOPTYSIS: Are you coughing up any blood? If so ask: How much? (flecks, streaks, tablespoons, etc.)     na 5. DIFFICULTY BREATHING: Are you having difficulty breathing? If Yes, ask: How bad is it? (e.g., mild, moderate, severe)    - MILD: No SOB at rest, mild SOB with walking, speaks normally in sentences, can lie down, no retractions, pulse < 100.    - MODERATE: SOB at rest, SOB with minimal exertion and prefers to sit,  cannot lie down flat, speaks in phrases, mild retractions, audible wheezing, pulse 100-120.    - SEVERE: Very SOB at rest, speaks in single words, struggling to breathe, sitting hunched forward, retractions, pulse > 120      Denies SOB.  6. FEVER: Do you have a fever? If Yes, ask: What is your temperature, how was it measured, and when did it start?     na 7. CARDIAC HISTORY: Do you have any history of heart disease? (e.g., heart attack, congestive heart failure)      na 8. LUNG HISTORY: Do you have any history of lung disease?  (e.g., pulmonary embolus, asthma, emphysema)     na 9. PE RISK FACTORS: Do you have a history of blood clots? (or: recent major surgery, recent prolonged travel, bedridden)     na 10. OTHER SYMPTOMS: Do you have any other symptoms? (e.g., runny nose, wheezing, chest pain)       Cough congestion, face pain, left ear congested blowing nose. Cough making side hurt. Mucinex  and tessalon  not working. Has taken sudafed with no relief 11. PREGNANCY: Is there any chance you are pregnant? When was your last menstrual period?       na 12. TRAVEL: Have you traveled out of the country in the last month? (e.g., travel history, exposures)       na  Protocols used: Cough - Acute Non-Productive-A-AH

## 2023-07-19 MED ORDER — HYDROCODONE BIT-HOMATROP MBR 5-1.5 MG/5ML PO SOLN: 5.0000 mL | Freq: Three times a day (TID) | ORAL | 0 refills | Status: DC | PRN

## 2023-08-01 ENCOUNTER — Telehealth: Payer: Self-pay | Admitting: Family Medicine

## 2023-08-01 NOTE — Telephone Encounter (Signed)
Received fax from Covermymeds for Oxycodone-Acet 10-325 mg.   Key: Erin Sherman

## 2023-08-08 NOTE — Telephone Encounter (Signed)
 PA started

## 2023-08-15 ENCOUNTER — Other Ambulatory Visit (HOSPITAL_COMMUNITY): Payer: Self-pay

## 2023-08-15 ENCOUNTER — Telehealth: Payer: Self-pay

## 2023-08-15 NOTE — Telephone Encounter (Signed)
Pharmacy Patient Advocate Encounter  Received notification from [facility] that Prior Authorization for oxyCODONE-Acetaminophen 10-325MG  tablets has been APPROVED from 08/10/23 to 02/06/24. Unable to obtain price due to refill too soon rejection, last fill date 08/10/23 next available fill date02/14/25   PA #/Case ID/Reference #: [id number]

## 2023-08-17 ENCOUNTER — Telehealth: Payer: Self-pay | Admitting: Family Medicine

## 2023-08-17 NOTE — Telephone Encounter (Signed)
The patient states she spoke with her insurance which is new as of the first of the year and they told her this prescription would now be covered and this is why she is asking for a prescription to be sent to her pharmacy. She has Community education officer and she said she brought it in with her last month for her appt and it is on file. She is requesting for a script to be sent [facility]      [id number] [place] [place] - [address] Phone: [phone]  Fax: [phone]     Please assist patient further

## 2023-08-18 MED ORDER — WEGOVY 0.25 MG/0.5ML ~~LOC~~ SOAJ: SUBCUTANEOUS | 1 refills | Status: DC

## 2023-09-12 ENCOUNTER — Ambulatory Visit: Payer: Self-pay | Admitting: Physician Assistant

## 2023-09-12 NOTE — Progress Notes (Deleted)
 Established patient visit  Patient: Erin Sherman   DOB: 1966-04-17   58 y.o. Female  MRN: [id number] Visit Date: 09/12/2023  Today's healthcare provider: Debera Lat, PA-C   No chief complaint on file.  Subjective       Discussed the use of AI scribe software for clinical note transcription with the patient, who gave verbal consent to proceed.  History of Present Illness               07/07/2023    4:05 PM 02/25/2022    1:24 PM 10/11/2021    1:56 PM  Depression screen PHQ 2/9  Decreased Interest 0 0 0  Down, Depressed, Hopeless 0 0 0  PHQ - 2 Score 0 0 0  Altered sleeping 2 0 0  Tired, decreased energy 2 0 3  Change in appetite 0 0 0  Feeling bad or failure about yourself  0 0 0  Trouble concentrating 0 0 0  Moving slowly or fidgety/restless 0 0 0  Suicidal thoughts 0 0 0  PHQ-9 Score 4 0 3  Difficult doing work/chores Not difficult at all Not difficult at all Not difficult at all      07/07/2023    4:05 PM  GAD 7 : Generalized Anxiety Score  Nervous, Anxious, on Edge 0  Control/stop worrying 0  Worry too much - different things 0  Trouble relaxing 0  Restless 0  Easily annoyed or irritable 0  Afraid - awful might happen 0  Total GAD 7 Score 0    Medications: Outpatient Medications Prior to Visit  Medication Sig  . albuterol (VENTOLIN HFA) 108 (90 Base) MCG/ACT inhaler INHALE 2 PUFFS BY MOUTH EVERY 6 HOURS AS NEEDED  . azelastine (ASTELIN) 0.1 % nasal spray Place 2 sprays into both nostrils 2 (two) times daily. Use in each nostril as directed  . benzonatate (TESSALON) 200 MG capsule TAKE 1 CAPSULE (200 MG TOTAL) BY MOUTH 3 (THREE) TIMES DAILY AS NEEDED FOR COUGH.  . calcium carbonate (TUMS - DOSED IN MG ELEMENTAL CALCIUM) 500 MG chewable tablet Chew 1 tablet by mouth daily as needed for indigestion.  . cholecalciferol (VITAMIN D) 1000 UNITS tablet Take 1,000 Units by mouth daily.  Marland Kitchen EPINEPHRINE 0.3 mg/0.3 mL IJ SOAJ injection INJECT 0.3 MLS (0.3 MG  TOTAL) INTO THE MUSCLE AS NEEDED. WITH ALLERGIC REACTION  . fluticasone (FLONASE) 50 MCG/ACT nasal spray SPRAY 2 SPRAYS INTO EACH NOSTRIL EVERY DAY  . Fluticasone-Umeclidin-Vilant (TRELEGY ELLIPTA) 200-62.5-25 MCG/ACT AEPB Inhale 1 puff into the lungs daily.  . Fluticasone-Umeclidin-Vilant (TRELEGY ELLIPTA) 200-62.5-25 MCG/ACT AEPB Inhale 1 Act into the lungs daily.  Marland Kitchen HYDROcodone bit-homatropine (HYCODAN) 5-1.5 MG/5ML syrup Take 5 mLs by mouth every 8 (eight) hours as needed for cough.  Marland Kitchen ipratropium-albuterol (DUONEB) 0.5-2.5 (3) MG/3ML SOLN INHALE 3 MLS INTO THE LUNGS EVERY 6 (SIX) HOURS AS NEEDED. REPORTED ON 12/28/2015  . levalbuterol (XOPENEX HFA) 45 MCG/ACT inhaler Inhale 2 puffs into the lungs every 6 (six) hours as needed for wheezing. May substitute generic  . loratadine (CLARITIN) 10 MG tablet Take 10 mg by mouth daily.  . montelukast (SINGULAIR) 10 MG tablet TAKE 1 TABLET BY MOUTH EVERYDAY AT BEDTIME  . omeprazole (PRILOSEC) 40 MG capsule TAKE 1 CAPSULE (40 MG TOTAL) BY MOUTH DAILY. (Patient not taking: Reported on 07/07/2023)  . ondansetron (ZOFRAN-ODT) 4 MG disintegrating tablet Take 1 tablet (4 mg total) by mouth every 8 (eight) hours as needed for nausea or vomiting. (Patient not taking: Reported  on 07/07/2023)  . oxyCODONE-acetaminophen (PERCOCET) 10-325 MG tablet Take 1 tablet by mouth every 8 (eight) hours as needed for pain.  . Semaglutide-Weight Management (WEGOVY) 0.25 MG/0.5ML SOAJ Inject 0.25mg  once a week x 4, then increase to 0.5mg   . sodium chloride HYPERTONIC 3 % nebulizer solution Use one- 15 mL ampule, up to 4 times daily, as needed, via nebulizer for cough/chest tightness.  Marland Kitchen Spacer/Aero-Holding Chambers (OPTICHAMBER ADVANTAGE) MISC 1 each by Other route once. Always uses her when you're using a metered-dose inhaler. You've aromatase medicine as much, he won't have his much side effect, but you it twice as much medicine and your lungs.  . triamcinolone cream (KENALOG) 0.5 %  Apply 1 Application topically 3 (three) times daily.   No facility-administered medications prior to visit.    Review of Systems  All other systems reviewed and are negative. All negative Except see HPI   {Insert previous labs (optional):23779} {See past labs  Heme  Chem  Endocrine  Serology  Results Review (optional):1}   Objective    There were no vitals taken for this visit. {Insert last BP/Wt (optional):23777}{See vitals history (optional):1}   Physical Exam Vitals reviewed.  Constitutional:      General: She is not in acute distress.    Appearance: Normal appearance. She is well-developed. She is not diaphoretic.  HENT:     Head: Normocephalic and atraumatic.  Eyes:     General: No scleral icterus.    Conjunctiva/sclera: Conjunctivae normal.  Neck:     Thyroid: No thyromegaly.  Cardiovascular:     Rate and Rhythm: Normal rate and regular rhythm.     Pulses: Normal pulses.     Heart sounds: Normal heart sounds. No murmur heard. Pulmonary:     Effort: Pulmonary effort is normal. No respiratory distress.     Breath sounds: Normal breath sounds. No wheezing, rhonchi or rales.  Musculoskeletal:     Cervical back: Neck supple.     Right lower leg: No edema.     Left lower leg: No edema.  Lymphadenopathy:     Cervical: No cervical adenopathy.  Skin:    General: Skin is warm and dry.     Findings: No rash.  Neurological:     Mental Status: She is alert and oriented to person, place, and time. Mental status is at baseline.  Psychiatric:        Mood and Affect: Mood normal.        Behavior: Behavior normal.     No results found for any visits on 09/12/23.      Assessment and Plan             No orders of the defined types were placed in this encounter.   No follow-ups on file.   The patient was advised to call back or seek an in-person evaluation if the symptoms worsen or if the condition fails to improve as anticipated.  I discussed the  assessment and treatment plan with the patient. The patient was provided an opportunity to ask questions and all were answered. The patient agreed with the plan and demonstrated an understanding of the instructions.  I, Debera Lat, PA-C have reviewed all documentation for this visit. The documentation on 09/12/2023  for the exam, diagnosis, procedures, and orders are all accurate and complete.  Debera Lat, [facility], MMS [facility] [phone] (phone) [phone] (fax)  [facility]

## 2023-09-28 ENCOUNTER — Other Ambulatory Visit: Payer: Self-pay | Admitting: Family Medicine

## 2023-09-28 NOTE — Telephone Encounter (Signed)
 Copied from CRM [id number]. Topic: Clinical - Medication Refill >> Sep 28, 2023  1:46 PM Erin Sherman wrote: Most Recent Primary Care Visit:  Provider: Malva Limes  Department: [facility]  Visit Type: OFFICE VISIT  Date: 07/07/2023  Medication: phentermine 37.5 MG capsule  Has the patient contacted their pharmacy? Yes  Is this the correct pharmacy for this prescription? Yes   This is the patient's preferred pharmacy:  [id number] [place], [place] - [address] [place] [address] Phone: [phone] Fax: [phone]   Has the prescription been filled recently? No  Is the patient out of the medication? Yes  Has the patient been seen for an appointment in the last year OR does the patient have an upcoming appointment? Yes  Can we respond through MyChart? No  Agent: Please be advised that Rx refills may take up to 3 business days. We ask that you follow-up with your pharmacy.

## 2023-10-06 ENCOUNTER — Ambulatory Visit: Payer: Self-pay | Admitting: Family Medicine

## 2023-10-06 ENCOUNTER — Encounter: Payer: Self-pay | Admitting: Family Medicine

## 2023-10-06 DIAGNOSIS — R053 Chronic cough: Secondary | ICD-10-CM

## 2023-10-06 MED ORDER — DICLOFENAC SODIUM 1 % EX GEL: 4.0000 g | Freq: Four times a day (QID) | CUTANEOUS | 1 refills | Status: DC

## 2023-10-06 MED ORDER — HYDROCODONE BIT-HOMATROP MBR 5-1.5 MG/5ML PO SOLN: 5.0000 mL | Freq: Three times a day (TID) | ORAL | 0 refills | Status: DC | PRN

## 2023-10-06 MED ORDER — PHENTERMINE HCL 37.5 MG PO CAPS: 37.5000 mg | ORAL_CAPSULE | ORAL | 4 refills | Status: DC

## 2023-10-06 MED ORDER — WEGOVY 0.5 MG/0.5ML ~~LOC~~ SOAJ: 0.5000 mg | SUBCUTANEOUS | 2 refills | Status: AC

## 2023-10-10 ENCOUNTER — Telehealth: Payer: Self-pay

## 2023-10-10 ENCOUNTER — Other Ambulatory Visit (HOSPITAL_COMMUNITY): Payer: Self-pay

## 2023-10-10 NOTE — Telephone Encounter (Signed)
 Pharmacy Patient Advocate Encounter  Received notification from [facility] that Prior Authorization for Phentermine HCl 37.5MG  capsules has been DENIED.  Full denial letter will be uploaded to the media tab. See denial reason below.   PA #/Case ID/Reference #: [id number]

## 2023-10-10 NOTE — Telephone Encounter (Signed)
 Pharmacy Patient Advocate Encounter   Received notification from Onbase that prior authorization for Diclofenac Sodium 1% gel is required/requested.   Insurance verification completed.   The patient is insured through [facility] .   Per test claim: PA required; PA submitted to above mentioned insurance via CoverMyMeds Key/confirmation #/EOC [id number] Status is pending

## 2023-10-10 NOTE — Telephone Encounter (Signed)
 Pharmacy Patient Advocate Encounter   Received notification from Onbase that prior authorization for Phentermine HCl 37.5MG  capsules is required/requested.   Insurance verification completed.   The patient is insured through [facility] .   Per test claim: PA required; PA submitted to above mentioned insurance via CoverMyMeds Key/confirmation #/EOC [id number] Status is pending

## 2023-10-10 NOTE — Telephone Encounter (Signed)
 Pharmacy Patient Advocate Encounter  Received notification from [facility] that Prior Authorization for Diclofenac Sodium 1% gel has been APPROVED from 10/10/23 to 02/09/24. Unable to obtain price due to refill too soon rejection, last fill date 10/10/23 next available fill date04/16/25   PA #/Case ID/Reference #: [id number]

## 2023-10-19 ENCOUNTER — Telehealth: Payer: Self-pay | Admitting: Family Medicine

## 2023-10-19 NOTE — Telephone Encounter (Signed)
 [place] sent in [facility] paperwork to be completed, front office portion filled out and placed in provider mailbox

## 2023-10-20 ENCOUNTER — Telehealth: Payer: Self-pay | Admitting: Family Medicine

## 2023-10-20 NOTE — Telephone Encounter (Signed)
 FMLA paperwork was received on 10/20/2023 & placed in provider's mailbox. Please allow 7-10 business days to be completed. Thank you

## 2023-10-30 DIAGNOSIS — Z0279 Encounter for issue of other medical certificate: Secondary | ICD-10-CM

## 2023-10-31 ENCOUNTER — Telehealth: Payer: Self-pay | Admitting: Family Medicine

## 2023-10-31 ENCOUNTER — Other Ambulatory Visit: Payer: Self-pay | Admitting: Family Medicine

## 2023-10-31 DIAGNOSIS — M25561 Pain in right knee: Secondary | ICD-10-CM

## 2023-10-31 NOTE — Telephone Encounter (Signed)
 Copied from CRM [id number]. Topic: Clinical - Medication Refill >> Oct 31, 2023  8:20 AM Rosamond Comes wrote: Patient requesting medication refill  Most Recent Primary Care Visit:  Provider: Lamon Pillow  Department: [facility]  Visit Type: OFFICE VISIT  Date: 10/06/2023  Medication: oxyCODONE -acetaminophen  (PERCOCET) 10-325 MG tablet   Has the patient contacted their pharmacy? Yes (Pharmacy stated to call provider  Is this the correct pharmacy for this prescription? Yes If no, delete pharmacy and type the correct one.  This is the patient's preferred pharmacy:  [id number] [place] [place] - [address] [place] [address] Phone: [phone] Fax: [phone]   Has the prescription been filled recently? Yes  Is the patient out of the medication? Yes  Has the patient been seen for an appointment in the last year OR does the patient have an upcoming appointment? Yes  Can we respond through MyChart? no  Agent: Please be advised that Rx refills may take up to 3 business days. We ask that you follow-up with your pharmacy.

## 2023-10-31 NOTE — Telephone Encounter (Signed)
 FMLA paperwork was received on 10/31/2023 & placed in provider's mailbox. Please allow 7-10 business days to be completed. Thank you

## 2023-11-01 NOTE — Telephone Encounter (Signed)
 FMLA forms for intermittent leave 10-17-2023 through 04-17-2024 completed and sent to medical records to be faxed.

## 2023-11-02 NOTE — Telephone Encounter (Signed)
 Requested medication (s) are due for refill today - yes  Requested medication (s) are on the active medication list -no  Future visit scheduled -no  Last refill: 07/07/23 #30  Notes to clinic: non delegated Rx  Requested Prescriptions  Pending Prescriptions Disp Refills   oxyCODONE -acetaminophen  (PERCOCET) 10-325 MG tablet 30 tablet 0    Sig: Take 1 tablet by mouth every 8 (eight) hours as needed for pain.     Not Delegated - Analgesics:  Opioid Agonist Combinations Failed - 11/02/2023  9:23 AM      Failed - This refill cannot be delegated      Failed - Urine Drug Screen completed in last 360 days      Passed - Valid encounter within last 3 months    Recent Outpatient Visits           3 weeks ago Chronic cough   [facility] Lamon Pillow, MD                 Requested Prescriptions  Pending Prescriptions Disp Refills   oxyCODONE -acetaminophen  (PERCOCET) 10-325 MG tablet 30 tablet 0    Sig: Take 1 tablet by mouth every 8 (eight) hours as needed for pain.     Not Delegated - Analgesics:  Opioid Agonist Combinations Failed - 11/02/2023  9:23 AM      Failed - This refill cannot be delegated      Failed - Urine Drug Screen completed in last 360 days      Passed - Valid encounter within last 3 months    Recent Outpatient Visits           3 weeks ago Chronic cough   [facility] Lamon Pillow, MD

## 2023-11-02 NOTE — Telephone Encounter (Signed)
 Erin Sherman FMLA paperwork completed and faxed back on 5-1/25 VM

## 2023-11-03 MED ORDER — OXYCODONE-ACETAMINOPHEN 10-325 MG PO TABS
1.0000 | ORAL_TABLET | Freq: Three times a day (TID) | ORAL | 0 refills | Status: DC | PRN
Start: 2023-11-03 — End: 2024-01-04

## 2023-11-04 NOTE — Progress Notes (Unsigned)
 Established patient visit   Patient: Erin Sherman   DOB: 09/29/1965   58 y.o. Female  MRN: [id number] Visit Date: 10/06/2023  Today's healthcare provider: Jeralene Mom, MD   Chief Complaint  Patient presents with   Knee Pain    Pain scale 9/10 right knee,  Swollen, cramps, pain radiating down, worse when walking Pt is worried about falling   Subjective    Discussed the use of AI scribe software for clinical note transcription with the patient, who gave verbal consent to proceed.  History of Present Illness   Erin Sherman is a 58 year old female with arthritis, bursitis, and osteoporosis who presents with worsening knee pain and swelling.  She experiences significant swelling and pain in her knee, describing it as 'swollen' and 'hurts more' when cold. The pain has persisted since receiving two steroid injections, which she believes have exacerbated her symptoms. She also experiences charley horse-like cramps on the outside of her leg, a new symptom for her.  Her history includes arthritis, bursitis, and osteoporosis, particularly affecting her knee. The pain is severe enough to make her feel like she is 'n[age] years old' and she fears that her leg might 'break' if she makes a wrong move. She has been using lidocaine  patches and Salonpas patches for relief, but they have not been effective. She also mentions using Tiger Balm cream and considering a ginger remedy suggested by a nurse.  She has a history of weight fluctuations, previously losing weight with Wegovy , which she stopped due to insurance issues. She reports regaining weight after stopping the medication and is currently at 282 pounds, having previously been at 262 pounds. She has one pill of phentermine  left, which she has been using for weight management.  She mentions a history of steroid use for asthma and bronchitis, which she had stopped for a year in preparation for a breast reduction surgery that was  ultimately not performed due to her medical conditions. She expresses frustration with the impact of steroids on her weight.  She is currently taking Tessalon  Perles for cough management, which she uses when her coughing becomes severe. She keeps water nearby to help manage her symptoms.       Medications: Outpatient Medications Prior to Visit  Medication Sig Note   albuterol  (VENTOLIN  HFA) 108 (90 Base) MCG/ACT inhaler INHALE 2 PUFFS BY MOUTH EVERY 6 HOURS AS NEEDED    azelastine  (ASTELIN ) 0.1 % nasal spray Place 2 sprays into both nostrils 2 (two) times daily. Use in each nostril as directed    benzonatate  (TESSALON ) 200 MG capsule TAKE 1 CAPSULE (200 MG TOTAL) BY MOUTH 3 (THREE) TIMES DAILY AS NEEDED FOR COUGH.    calcium carbonate (TUMS - DOSED IN MG ELEMENTAL CALCIUM) 500 MG chewable tablet Chew 1 tablet by mouth daily as needed for indigestion.    cholecalciferol (VITAMIN D ) 1000 UNITS tablet Take 1,000 Units by mouth daily.    EPINEPHRINE  0.3 mg/0.3 mL IJ SOAJ injection INJECT 0.3 MLS (0.3 MG TOTAL) INTO THE MUSCLE AS NEEDED. WITH ALLERGIC REACTION    fluticasone  (FLONASE ) 50 MCG/ACT nasal spray SPRAY 2 SPRAYS INTO EACH NOSTRIL EVERY DAY    Fluticasone -Umeclidin-Vilant (TRELEGY ELLIPTA ) 200-62.5-25 MCG/ACT AEPB Inhale 1 puff into the lungs daily.    Fluticasone -Umeclidin-Vilant (TRELEGY ELLIPTA ) 200-62.5-25 MCG/ACT AEPB Inhale 1 Act into the lungs daily.    ipratropium-albuterol  (DUONEB) 0.5-2.5 (3) MG/3ML SOLN INHALE 3 MLS INTO THE LUNGS EVERY 6 (SIX) HOURS AS NEEDED.  REPORTED ON 12/28/2015    levalbuterol  (XOPENEX  HFA) 45 MCG/ACT inhaler Inhale 2 puffs into the lungs every 6 (six) hours as needed for wheezing. May substitute generic    montelukast  (SINGULAIR ) 10 MG tablet TAKE 1 TABLET BY MOUTH EVERYDAY AT BEDTIME    ondansetron  (ZOFRAN -ODT) 4 MG disintegrating tablet Take 1 tablet (4 mg total) by mouth every 8 (eight) hours as needed for nausea or vomiting. 10/06/2023: Needs refill    sodium chloride  HYPERTONIC 3 % nebulizer solution Use one- 15 mL ampule, up to 4 times daily, as needed, via nebulizer for cough/chest tightness.    Spacer/Aero-Holding Chambers (OPTICHAMBER ADVANTAGE) MISC 1 each by Other route once. Always uses her when you're using a metered-dose inhaler. You've aromatase medicine as much, he won't have his much side effect, but you it twice as much medicine and your lungs.    triamcinolone  cream (KENALOG ) 0.5 % Apply 1 Application topically 3 (three) times daily.    [DISCONTINUED] HYDROcodone  bit-homatropine (HYCODAN) 5-1.5 MG/5ML syrup Take 5 mLs by mouth every 8 (eight) hours as needed for cough.    [DISCONTINUED] oxyCODONE -acetaminophen  (PERCOCET) 10-325 MG tablet Take 1 tablet by mouth every 8 (eight) hours as needed for pain.    omeprazole  (PRILOSEC) 40 MG capsule TAKE 1 CAPSULE (40 MG TOTAL) BY MOUTH DAILY. (Patient not taking: Reported on 10/06/2023)    [DISCONTINUED] loratadine (CLARITIN) 10 MG tablet Take 10 mg by mouth daily. (Patient not taking: Reported on 10/06/2023)    [DISCONTINUED] Semaglutide -Weight Management (WEGOVY ) 0.25 MG/0.5ML SOAJ Inject 0.25mg  once a week x 4, then increase to 0.5mg  (Patient not taking: Reported on 10/06/2023)    No facility-administered medications prior to visit.   Review of Systems {Insert previous labs (optional):23779} {See past labs  Heme  Chem  Endocrine  Serology  Results Review (optional):1}   Objective    BP (!) 175/66   Pulse 91   Ht 5' (1.524 m)   Wt 283 lb 3.2 oz (128.5 kg)   SpO2 99%   BMI 55.31 kg/m {Insert last BP/Wt (optional):23777}{See vitals history (optional):1}  Physical Exam *** Physical Exam   VITALS: BP- 175/66 MEASUREMENTS: Weight- 282.    No results found for any visits on 10/06/23.  Assessment & Plan        Obesity Weight increased to 282 lbs after previous loss with Wegovy . Insurance issues prevented continued use. Interested in weight management to alleviate knee pain.  Discussed benefits of weight loss on knee pain and health. Provided information on Wegovy  copay program. - Provide samples of Wegovy  for initial use. - Enroll in Wegovy  copay program to reduce cost. - Send prescription for next higher dose of Wegovy  (0.5 mg).  Arthritis Knee arthritis causing significant pain and swelling. Previous steroid injections ineffective with adverse effects. Discussed Voltaren  gel as a topical alternative. - Prescribe Voltaren  gel for topical application to the knee.  Bursitis Bursitis contributing to knee pain and swelling. Previous steroid injections caused adverse effects. Discussed Voltaren  gel as a topical alternative. - Prescribe Voltaren  gel for topical application to the knee.  Osteoporosis Osteoporosis diagnosed in the knee. No surgical intervention required as per recent evaluation.  Hypertension Blood pressure elevated at 175/66 mmHg. Measurement may have been affected by talking during reading.    No follow-ups on file.      Jeralene Mom, MD  [facility] [phone] (phone) [phone] (fax)  [facility]

## 2023-11-06 ENCOUNTER — Telehealth: Payer: Self-pay

## 2023-11-06 ENCOUNTER — Other Ambulatory Visit (HOSPITAL_COMMUNITY): Payer: Self-pay

## 2023-11-06 NOTE — Telephone Encounter (Signed)
 Pt called in states, [employer] hasn't received her FMLA paperwork yet

## 2023-11-06 NOTE — Telephone Encounter (Signed)
 LM for patient to call back. According to our record prescription for oxyCODONE -acetaminophen  (PERCOCET) 10-325 MG tablet was sent in 11/03/2023 and confirmed by [facility].  Was her FMLA form faxed?

## 2023-11-06 NOTE — Telephone Encounter (Signed)
 Copied from CRM [id number]. Topic: General - Other >> Oct 31, 2023  9:33 AM Loreda Rodriguez T wrote: Reason for CRM: patient called said she fax 2 copies of her FMLA paperwork as it has to be sent back to her admin before May 3rd. Please f/u with patient to let her know when paperwork has been recvd >> Nov 02, 2023  3:59 PM Terence Fend E wrote: Patient called in asking if FMLA paper work was faxed in. Paper was faxed 11/02/23. Patient is very frustrated that the refill has not been sent to the pharmacy. I placed patient on hold. Patient hung up.  oxyCODONE -acetaminophen  (PERCOCET) 10-325 MG tablet  >> Oct 31, 2023  9:38 AM Elgin Grit wrote: I spoke to the patient she was refaxing document so that I could get it over to her provider today  >> Nov 06, 2023  1:04 PM Felizardo Hotter wrote: Pt stated they have not received the FLMA paperwork that was sent to PCP. Pt asked for a call back at [phone] .

## 2023-11-06 NOTE — Telephone Encounter (Signed)
 Copied from CRM [id number]. Topic: Medical Record Request - Other >> Nov 06, 2023  2:16 PM Ivette P wrote: Reason for CRM: Pt called in to provide fax number where her FMLA paper work can be faxed to. Pt available for callback if any issues   fax [phone]  Callback [phone]

## 2023-11-07 NOTE — Telephone Encounter (Signed)
 Paperwork from faxed back already and there is no current copy in office as it mailed mailed off to HIM to up uploaded to media once that is done I can  print and refax at that time

## 2023-11-28 ENCOUNTER — Other Ambulatory Visit (HOSPITAL_COMMUNITY): Payer: Self-pay

## 2023-12-07 ENCOUNTER — Other Ambulatory Visit (HOSPITAL_COMMUNITY): Payer: Self-pay

## 2023-12-19 ENCOUNTER — Encounter: Payer: Self-pay | Admitting: Family Medicine

## 2023-12-19 ENCOUNTER — Telehealth (INDEPENDENT_AMBULATORY_CARE_PROVIDER_SITE_OTHER): Admitting: Family Medicine

## 2023-12-19 DIAGNOSIS — M170 Bilateral primary osteoarthritis of knee: Secondary | ICD-10-CM

## 2023-12-19 DIAGNOSIS — J4551 Severe persistent asthma with (acute) exacerbation: Secondary | ICD-10-CM

## 2023-12-19 DIAGNOSIS — M169 Osteoarthritis of hip, unspecified: Secondary | ICD-10-CM

## 2023-12-19 DIAGNOSIS — R110 Nausea: Secondary | ICD-10-CM | POA: Diagnosis not present

## 2023-12-19 DIAGNOSIS — J020 Streptococcal pharyngitis: Secondary | ICD-10-CM | POA: Diagnosis not present

## 2023-12-19 DIAGNOSIS — M159 Polyosteoarthritis, unspecified: Secondary | ICD-10-CM

## 2023-12-19 DIAGNOSIS — M479 Spondylosis, unspecified: Secondary | ICD-10-CM

## 2023-12-19 DIAGNOSIS — R053 Chronic cough: Secondary | ICD-10-CM | POA: Diagnosis not present

## 2023-12-19 DIAGNOSIS — M172 Bilateral post-traumatic osteoarthritis of knee: Secondary | ICD-10-CM | POA: Insufficient documentation

## 2023-12-19 MED ORDER — ONDANSETRON 4 MG PO TBDP: 4.0000 mg | ORAL_TABLET | Freq: Three times a day (TID) | ORAL | 0 refills | Status: DC | PRN

## 2023-12-19 MED ORDER — TRELEGY ELLIPTA 200-62.5-25 MCG/ACT IN AEPB: 1.0000 | INHALATION_SPRAY | Freq: Every day | RESPIRATORY_TRACT | 10 refills | Status: DC

## 2023-12-19 MED ORDER — HYDROCODONE BIT-HOMATROP MBR 5-1.5 MG/5ML PO SOLN: 5.0000 mL | Freq: Three times a day (TID) | ORAL | 0 refills | Status: DC | PRN

## 2023-12-19 MED ORDER — PREDNISONE 20 MG PO TABS: ORAL_TABLET | ORAL | 0 refills | Status: DC

## 2023-12-19 NOTE — Patient Instructions (Signed)
 Take amoxicillin  with food; avoid eating yogurt within two hours of each dose.  Discussed yogurt timing to prevent antibiotic interference and improve probiotic benefit. Ensure yogurt of choice contains active cultures (Fage, Chobani, Activtia, or various available Greek yogurts typically contain these).

## 2023-12-19 NOTE — Progress Notes (Signed)
 MyChart Video Visit    Virtual Visit via Video Note   This format is felt to be most appropriate for this patient at this time. Physical exam was limited by quality of the video and audio technology used for the visit.   Patient location: Home, in [place] Provider location: [facility]  I discussed the limitations of evaluation and management by telemedicine and the availability of in person appointments. The patient expressed understanding and agreed to proceed.  Patient: Erin Sherman   DOB: [birth year]   58 y.o. Female  MRN: [id number] Visit Date: 12/19/2023  Today's healthcare provider: Carlean Charter, DO   No chief complaint on file.  Subjective    HPI  Erin Sherman is a 58 year old female with asthma who presents with shortness of breath and weakness following a recent diagnosis of strep throat.  She has been experiencing shortness of breath, particularly while walking, and general weakness. She visited the ER in [place], [place] , due to a high fever of 103F, sore throat and difficulty breathing. She was diagnosed with strep throat and was prescribed amoxicillin  500 mg, initially taken once daily due to confusion about the dosage instructions (supposed to be every 8 hours). She also received a liquid steroid for her throat but continues to feel weak.  She describes a persistent dry cough that causes chest pain, exacerbated by coughing. She previously had cough syrup prescribed by Erin Sherman, but has run out. She endorses wheezing and mentions a 'tacky thing' when coughing.  She has been experiencing right-sided headaches during this illness.  She has a history of asthma and uses albuterol , which was recently refilled. She has been out of Trelegy for a couple of weeks, which she normally takes daily. 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  She mentions a family history of strep throat,  noting her stepdaughter and grandson had it before her trip to [place] . She suspects she may have contracted it from them.  She has a history of osteoarthritis, affecting her knee, spine, and hip, and has undergone physical therapy and received injections for pain management. She uses Tiger Balm and prescription cream (voltaren ) for relief, but finds them ineffective.  She is allergic to sulfa medications and has experienced stomach upset with the amoxicillin , which she has been taking with food to mitigate nausea.  From record: At ER in [place], [place] 12/16/2023: POC strep positive. CBC show WBC 20.5. Negative for Covid, influenza A&B, and RSV. Negative troponin-HS; Magnesium WNL. CMP and lactic acid unremarkable.  CXR 2-view negative for acute cardiopulmonary disease. Rocehpin IV, decadron , tylenol , 1L NS bolus and DuoNebs given at ER.    Medications: Outpatient Medications Prior to Visit  Medication Sig   albuterol  (VENTOLIN  HFA) 108 (90 Base) MCG/ACT inhaler INHALE 2 PUFFS BY MOUTH EVERY 6 HOURS AS NEEDED   azelastine  (ASTELIN ) 0.1 % nasal spray Place 2 sprays into both nostrils 2 (two) times daily. Use in each nostril as directed   benzonatate  (TESSALON ) 200 MG capsule TAKE 1 CAPSULE (200 MG TOTAL) BY MOUTH 3 (THREE) TIMES DAILY AS NEEDED FOR COUGH.   calcium carbonate (TUMS - DOSED IN MG ELEMENTAL CALCIUM) 500 MG chewable tablet Chew 1 tablet by mouth daily as needed for indigestion.   cholecalciferol (VITAMIN D ) 1000 UNITS tablet Take 1,000 Units by mouth daily.   diclofenac  Sodium (VOLTAREN ) 1 % GEL Apply 4 g topically 4 (  four) times daily.   EPINEPHRINE  0.3 mg/0.3 mL IJ SOAJ injection INJECT 0.3 MLS (0.3 MG TOTAL) INTO THE MUSCLE AS NEEDED. WITH ALLERGIC REACTION   fluticasone  (FLONASE ) 50 MCG/ACT nasal spray SPRAY 2 SPRAYS INTO EACH NOSTRIL EVERY DAY   ipratropium-albuterol  (DUONEB) 0.5-2.5 (3) MG/3ML SOLN INHALE 3 MLS INTO THE LUNGS EVERY 6 (SIX) HOURS AS NEEDED. REPORTED ON  12/28/2015   levalbuterol  (XOPENEX  HFA) 45 MCG/ACT inhaler Inhale 2 puffs into the lungs every 6 (six) hours as needed for wheezing. May substitute generic   montelukast  (SINGULAIR ) 10 MG tablet TAKE 1 TABLET BY MOUTH EVERYDAY AT BEDTIME   omeprazole  (PRILOSEC) 40 MG capsule TAKE 1 CAPSULE (40 MG TOTAL) BY MOUTH DAILY. (Patient not taking: Reported on 10/06/2023)   oxyCODONE -acetaminophen  (PERCOCET) 10-325 MG tablet Take 1 tablet by mouth every 8 (eight) hours as needed for pain.   phentermine  37.5 MG capsule Take 1 capsule (37.5 mg total) by mouth every morning. DO NOT TAKE THIS MEDICATION WHEN TAKING ZYRTEC -D   Semaglutide -Weight Management (WEGOVY ) 0.5 MG/0.5ML SOAJ Inject 0.5 mg into the skin once a week.   sodium chloride  HYPERTONIC 3 % nebulizer solution Use one- 15 mL ampule, up to 4 times daily, as needed, via nebulizer for cough/chest tightness.   Spacer/Aero-Holding Chambers (OPTICHAMBER ADVANTAGE) MISC 1 each by Other route once. Always uses her when you're using a metered-dose inhaler. You've aromatase medicine as much, he won't have his much side effect, but you it twice as much medicine and your lungs.   triamcinolone  cream (KENALOG ) 0.5 % Apply 1 Application topically 3 (three) times daily.   [DISCONTINUED] Fluticasone -Umeclidin-Vilant (TRELEGY ELLIPTA ) 200-62.5-25 MCG/ACT AEPB Inhale 1 puff into the lungs daily.   [DISCONTINUED] Fluticasone -Umeclidin-Vilant (TRELEGY ELLIPTA ) 200-62.5-25 MCG/ACT AEPB Inhale 1 Act into the lungs daily.   [DISCONTINUED] HYDROcodone  bit-homatropine (HYCODAN) 5-1.5 MG/5ML syrup Take 5 mLs by mouth every 8 (eight) hours as needed for cough.   [DISCONTINUED] ondansetron  (ZOFRAN -ODT) 4 MG disintegrating tablet Take 1 tablet (4 mg total) by mouth every 8 (eight) hours as needed for nausea or vomiting.   No facility-administered medications prior to visit.    Review of Systems  Constitutional:  Positive for fever (103 Saturday, while in [place], [place]).  HENT:   Positive for sore throat. Negative for sinus pressure and sinus pain.   Respiratory:  Positive for cough, shortness of breath (+DOE) and wheezing.   Cardiovascular:  Positive for chest pain (w/coughing). Negative for palpitations.  Neurological:  Positive for weakness and headaches (right side of head).        Objective    There were no vitals taken for this visit.     Physical Exam Constitutional:      General: She is not in acute distress.    Appearance: Normal appearance.  HENT:     Head: Normocephalic.  Pulmonary:     Effort: Pulmonary effort is normal. No respiratory distress.   Neurological:     Mental Status: She is alert and oriented to person, place, and time. Mental status is at baseline.       Assessment & Plan    Strep throat -     predniSONE ; Take 60mg  PO daily x 2 days, then40mg  PO daily x 2 days, then 20mg  PO daily x 3 days  Dispense: 13 tablet; Refill: 0  Severe persistent asthma with acute exacerbation -     Trelegy Ellipta ; Inhale 1 Act into the lungs daily.  Dispense: 1 each; Refill: 10 -  predniSONE ; Take 60mg  PO daily x 2 days, then40mg  PO daily x 2 days, then 20mg  PO daily x 3 days  Dispense: 13 tablet; Refill: 0  Chronic cough -     HYDROcodone  Bit-Homatrop MBr; Take 5 mLs by mouth every 8 (eight) hours as needed for cough.  Dispense: 120 mL; Refill: 0  Nausea -     Ondansetron ; Take 1 tablet (4 mg total) by mouth every 8 (eight) hours as needed for nausea or vomiting.  Dispense: 16 tablet; Refill: 0  Bilateral post-traumatic osteoarthritis of knee  Osteoarthritis of multiple joints, unspecified osteoarthritis type      Streptococcal Pharyngitis; nausea Acute streptococcal pharyngitis confirmed by positive test. Incorrect amoxicillin  use due to instruction confusion. Gastrointestinal side effects causing non-compliance. Negative for COVID-19 and flu. Likely contracted from family member. Proper treatment prevents complications. - Adjust  amoxicillin  to 500 mg twice daily for 10 days. - Prescribe Zofran  for nausea. - Advise taking amoxicillin  with food, avoid yogurt within two hours of each dose. - Educate on yogurt timing to prevent antibiotic interference and improve probiotic benefit. Ensure yogurt of choice contains active cultures. - Advise mask use to prevent transmission.  Asthma with acute exacerbation Asthma with wheezing and cough. Out of Trelegy, contributing to symptoms. Albuterol  inhaler available. - Refill Trelegy inhaler. - Prescribe steroid taper for wheezing and cough.  Chronic cough - Prescribe Hycodan for cough relief.  Osteoarthritis Chronic osteoarthritis in knees, spine, and hip. Limited relief from Tiger Balm and prescription voltaren . Exercises and physical therapy beneficial. - Provide knee exercises and stretches in after-visit summary. - Recommend physical therapy exercises to strengthen muscles around affected joints (attached to AVS).    Return if symptoms worsen or fail to improve.     I discussed the assessment and treatment plan with the patient. The patient was provided an opportunity to ask questions and all were answered. The patient agreed with the plan and demonstrated an understanding of the instructions.   The patient was advised to call back or seek an in-person evaluation if the symptoms worsen or if the condition fails to improve as anticipated.  I provided 16 minutes of virtual-face-to-face time during this encounter.   Carlean Charter, DO [facility] [phone] (phone) [phone] (fax)  [facility]

## 2023-12-30 ENCOUNTER — Other Ambulatory Visit: Payer: Self-pay | Admitting: Family Medicine

## 2024-01-04 ENCOUNTER — Ambulatory Visit: Payer: Self-pay

## 2024-01-04 ENCOUNTER — Telehealth: Payer: Self-pay | Admitting: Family Medicine

## 2024-01-04 DIAGNOSIS — M25561 Pain in right knee: Secondary | ICD-10-CM

## 2024-01-04 MED ORDER — OXYCODONE-ACETAMINOPHEN 10-325 MG PO TABS: 1.0000 | ORAL_TABLET | Freq: Three times a day (TID) | ORAL | 0 refills | Status: DC | PRN

## 2024-01-04 NOTE — Telephone Encounter (Signed)
 FYI Only or Action Required?: Action required by provider: medication refill request.  Patient was last seen in primary care on 12/19/2023 by Donzella Lauraine SAILOR, DO. Called Nurse Triage reporting Knee Pain. Symptoms began today. Interventions attempted: oxyCODONE -acetaminophen  (PERCOCET) 10-325 MG tablet . Symptoms are: unchanged.  Triage Disposition: Call PCP Within 24 Hours  Patient/caregiver understands and will follow disposition?: No, refuses disposition  **Patient wants oxyCODONE  refiiled**                           Copied from CRM [id number]. Topic: Clinical - Red Word Triage >> Jan 04, 2024  3:57 PM Berwyn MATSU wrote: Red Word that prompted transfer to Nurse Triage: pain in the knees out of pain medication. Reason for Disposition  [1] Caller requests to speak ONLY to PCP AND [2] NON-URGENT question  Answer Assessment - Initial Assessment Questions 1. REASON FOR CALL or QUESTION: What is your reason for calling today? or How can I best help you? or What question do you have that I can help answer?   Patient called in stating she took her last oxyCODONE -acetaminophen  (PERCOCET) 10-325 MG tablet.  She is seeking a refill. She will continue to use her pain patches in the meantime.  Protocols used: PCP Call - No Triage-A-AH

## 2024-01-04 NOTE — Telephone Encounter (Signed)
 Copied from CRM [id number]. Topic: Clinical - Medication Refill >> Jan 04, 2024 11:27 AM Erin Sherman wrote: Medication: oxyCODONE -acetaminophen  (PERCOCET) 10-325 MG tablet [[id number]]  Has the patient contacted their pharmacy? Yes (Agent: If no, request that the patient contact the pharmacy for the refill. If patient does not wish to contact the pharmacy document the reason why and proceed with request.) (Agent: If yes, when and what did the pharmacy advise?)  This is the patient's preferred pharmacy:  [id number] - [place], [place] - [address] [place] [address] Phone: [phone] Fax: [phone]  Is this the correct pharmacy for this prescription? Yes If no, delete pharmacy and type the correct one.   Has the prescription been filled recently? No  Is the patient out of the medication? Yes  Has the patient been seen for an appointment in the last year OR does the patient have an upcoming appointment? Yes  Can we respond through MyChart? No  Agent: Please be advised that Rx refills may take up to 3 business days. We ask that you follow-up with your pharmacy.

## 2024-01-15 ENCOUNTER — Telehealth: Payer: Self-pay

## 2024-01-15 NOTE — Telephone Encounter (Signed)
 This is not currently on patient med list   Copied from CRM [id number]. Topic: Clinical - Medication Question >> Jan 15, 2024  8:22 AM Everette C wrote: Reason for CRM: The patient would like to know if there are any samples of Semaglutide -Weight Management (WEGOVY ) 1 MG/0.5ML SOAJ [[id number]] available at the practice for them to pick up  Please contact the patient further if/when possible

## 2024-01-15 NOTE — Telephone Encounter (Signed)
 I don't think we have any 1mg  Wegovy  samples. Does she want prescription sent in ? She was prescribed 0.5mg  in April

## 2024-01-16 NOTE — Telephone Encounter (Signed)
 I don't know what she is talking about. I've never heard of a Wegovy  Gel Pen. It does not exist in our system so I cannot order such a thing.

## 2024-01-17 ENCOUNTER — Other Ambulatory Visit: Payer: Self-pay

## 2024-01-17 DIAGNOSIS — J4551 Severe persistent asthma with (acute) exacerbation: Secondary | ICD-10-CM

## 2024-01-17 MED ORDER — TRELEGY ELLIPTA 200-62.5-25 MCG/ACT IN AEPB: 1.0000 | INHALATION_SPRAY | Freq: Every day | RESPIRATORY_TRACT | 10 refills | Status: AC

## 2024-01-17 NOTE — Telephone Encounter (Addendum)
 After discussing with the patient, it became clear that she was referring to GLP1, not a 'gel pen.' I explained that Wegovy  is a GLP1 medication and encouraged her to confirm with [employer] about coverage for any GLP1.  Patient requested a refill on Trellegy. This has been sent to pharmcy on file.

## 2024-02-19 ENCOUNTER — Ambulatory Visit: Admitting: Family Medicine

## 2024-02-19 ENCOUNTER — Encounter: Payer: Self-pay | Admitting: Family Medicine

## 2024-02-19 ENCOUNTER — Ambulatory Visit: Payer: Self-pay

## 2024-02-19 VITALS — BP 126/86 | HR 93 | Ht 60.0 in | Wt 278.0 lb

## 2024-02-19 DIAGNOSIS — M25561 Pain in right knee: Secondary | ICD-10-CM | POA: Diagnosis not present

## 2024-02-19 MED ORDER — MELOXICAM 15 MG PO TABS: 15.0000 mg | ORAL_TABLET | Freq: Every day | ORAL | 0 refills | Status: DC

## 2024-02-19 NOTE — Telephone Encounter (Signed)
 FYI Only or Action Required?: FYI only for provider.  Patient was last seen in primary care on 12/19/2023 by Donzella Lauraine SAILOR, DO.  Called Nurse Triage reporting Triage.  Symptoms began several years ago.  Interventions attempted: OTC medications: tylenol  and ibuprofen.  Symptoms are: gradually worsening.  Triage Disposition: No disposition on file.  Patient/caregiver understands and will follow disposition?:  Reason for Disposition  [1] SEVERE pain (e.g., excruciating, unable to walk) AND [2] not improved after 2 hours of pain medicine  Answer Assessment - Initial Assessment Questions LOCATION and RADIATION: Where is the pain located?      Chronic right knee and hip pain secondary to arthritis  SEVERITY: How bad is the pain? What does it keep you from doing?   (Scale 1-10; or mild, moderate, severe)     10/10  RECURRENT: Have you had this pain before? If Yes, ask: When, and what happened then?     Yes, patient reports is a chronic condition  Protocols used: Knee Pain-A-AH

## 2024-02-19 NOTE — Progress Notes (Signed)
 Acute visit   Patient: Erin Sherman   DOB: 1966-06-12   58 y.o. Female  MRN: [id number] PCP: Gasper Nancyann BRAVO, MD   Chief Complaint  Patient presents with   Knee Pain    R knee pain, that's getting worst, she has a pain management provider, she is scared to go back, because when she got her knee injection the last time he didn't do it in the right place after she told him where the pain is. Today she is requesting medication to help    Subjective    Discussed the use of AI scribe software for clinical note transcription with the patient, who gave verbal consent to proceed.  History of Present Illness   Erin Sherman is a 58 year old female with chronic knee pain and degenerative disc disease who presents with right knee swelling and pain.  She experiences persistent swelling and pain in her right knee, which began after multiple falls while wearing Crocs shoes. Previous imaging indicated torn tissues in the knee. She has received two steroid injections with limited relief, as the swelling persists from the knee upwards. She is apprehensive about further treatment due to past experiences.  She manages her pain with Tylenol  as she is out of her prescribed Percocet, which she uses for chronic pain related to degenerative disc disease. Her right side is significantly affected, with sharp pain radiating down her leg. She is not currently taking any anti-inflammatory medications but has previously used meloxicam  and prednisone . She is concerned about the side effects of long-term medication use, particularly on her kidneys, as she works in dialysis.  She has arthritis since age 86, exacerbated by injuries and accidents. She is taking vitamin D  supplements and has been on phentermine  for weight management, having lost ten pounds since June. She wants to lose an additional ten to fifteen pounds to alleviate pressure on her knee.        Review of Systems  Objective    BP  126/86   Pulse 93   Ht 5' (1.524 m)   Wt 278 lb (126.1 kg)   SpO2 99%   BMI 54.29 kg/m  Physical Exam Vitals reviewed.  Constitutional:      General: She is not in acute distress.    Appearance: She is well-developed.  HENT:     Head: Normocephalic and atraumatic.  Eyes:     General: No scleral icterus.    Conjunctiva/sclera: Conjunctivae normal.  Cardiovascular:     Rate and Rhythm: Normal rate and regular rhythm.  Pulmonary:     Effort: Pulmonary effort is normal. No respiratory distress.  Skin:    General: Skin is warm and dry.     Findings: No rash.  Neurological:     Mental Status: She is alert and oriented to person, place, and time.  Psychiatric:        Behavior: Behavior normal.       No results found for any visits on 02/19/24.  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 Previous steroid injections have not provided relief. Swelling is attributed to inflammation within the  knee joint. She is apprehensive about further interventions due to past experiences and concerns about infection. Discussed that steroid injections aim to reduce inflammation and provide relief for up to three months. If steroid injections fail, gel injections may be considered to provide cushioning in the knee joint. Discussed the risk of infection with any injection, but emphasized precautions taken to prevent joint infections. - Prescribe meloxicam  for one month to reduce inflammation and swelling. - Will follow up with orthopedist for further evaluation and discussion of next steps, including potential gel injections. - Advise to avoid NSAIDs like ibuprofen while taking meloxicam , but acetaminophen  is permissible.  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 Pain management is currently suboptimal due to running out of prescribed Percocet. She is cautious about medication use due to potential side effects but acknowledges the need for effective pain management. - Send message to Doctor Gasper to request a refill of Percocet for pain management. - Schedule follow-up appointment with Doctor Gasper to reassess pain management plan.  Obesity, currently managed with phentermine  Obesity management with phentermine  is ongoing. She has lost weight since June, but seeks further weight loss. Previous use of Wegovy  was effective but is no longer covered by insurance. Discussed that weight loss can alleviate knee pain due to reduced stress on the joint. - Refill phentermine  prescription to aid in weight management. - Encourage continued weight loss efforts as it may alleviate knee pain.       Meds ordered this encounter  Medications   meloxicam  (MOBIC ) 15 MG tablet    Sig: Take 1 tablet (15 mg total) by mouth daily.    Dispense:  30 tablet    Refill:  0     Return in about 4 weeks (around 03/18/2024) for chronic disease f/u, With PCP.      Jon Eva, MD  [facility] [phone] (phone) [phone] (fax)  [facility]

## 2024-02-23 ENCOUNTER — Other Ambulatory Visit: Payer: Self-pay | Admitting: Family Medicine

## 2024-02-23 DIAGNOSIS — M25561 Pain in right knee: Secondary | ICD-10-CM

## 2024-02-23 NOTE — Telephone Encounter (Signed)
 Requested medications are due for refill today.  yes  Requested medications are on the active medications list.  yes  Last refill. Percocet 01/04/2024 #30 0 rf. Phentermine  10/06/2023 #30 4 rf  Future visit scheduled.   yes  Notes to clinic.  Refills not delegated.    Requested Prescriptions  Pending Prescriptions Disp Refills   oxyCODONE -acetaminophen  (PERCOCET) 10-325 MG tablet 30 tablet 0    Sig: Take 1 tablet by mouth every 8 (eight) hours as needed for pain.     Not Delegated - Analgesics:  Opioid Agonist Combinations Failed - 02/23/2024  5:39 PM      Failed - This refill cannot be delegated      Failed - Urine Drug Screen completed in last 360 days      Passed - Valid encounter within last 3 months    Recent Outpatient Visits           4 days ago Acute pain of right knee   [facility] [place], Jon HERO, MD   2 months ago Strep throat   [facility] [place], Lauraine SAILOR, DO   4 months ago Chronic cough   [facility] Gasper Nancyann BRAVO, MD               phentermine  37.5 MG capsule 30 capsule 4    Sig: Take 1 capsule (37.5 mg total) by mouth every morning. DO NOT TAKE THIS MEDICATION WHEN TAKING ZYRTEC -D     Not Delegated - Neurology: Anticonvulsants - Controlled - phentermine  hydrochloride Failed - 02/23/2024  5:39 PM      Failed - This refill cannot be delegated      Passed - eGFR in normal range and within 360 days    EGFR (African American)  Date Value Ref Range Status  09/30/2014 >60  Final   GFR calc Af Amer  Date Value Ref Range Status  02/28/2020 119 >59 mL/min/1.73 Final    Comment:    **[facility] currently reports eGFR in compliance with the current**   recommendations of the [employer]. [facility] will   update reporting as new guidelines are published from the NKF-ASN   Task force.    EGFR (Non-African Amer.)  Date Value Ref Range Status  09/30/2014 >60   Final    Comment:    eGFR values <50mL/min/1.73 m2 may be an indication of chronic kidney disease (CKD). Calculated eGFR is useful in patients with stable renal function. The eGFR calculation will not be reliable in acutely ill patients when serum creatinine is changing rapidly. It is not useful in patients on dialysis. The eGFR calculation may not be applicable to patients at the low and high extremes of body sizes, pregnant women, and vegetarians.    GFR, Estimated  Date Value Ref Range Status  09/13/2022 >60 >60 mL/min Final    Comment:    (NOTE) Calculated using the CKD-EPI Creatinine Equation (2021)    eGFR  Date Value Ref Range Status  03/29/2023 106 >59 mL/min/1.73 Final         Passed - Cr in normal range and within 360 days    Creatinine  Date Value Ref Range Status  09/30/2014 0.70 mg/dL Final    Comment:    9.55-8.99 NOTE: New Reference Range  09/09/14    Creatinine, Ser  Date Value Ref Range Status  03/29/2023 0.58 0.57 - 1.00 mg/dL Final         Passed -  Last BP in normal range    BP Readings from Last 1 Encounters:  02/19/24 126/86         Passed - Valid encounter within last 6 months    Recent Outpatient Visits           4 days ago Acute pain of right knee   [facility] [place], Jon HERO, MD   2 months ago Strep throat   [facility] [place] Lauraine SAILOR, DO   4 months ago Chronic cough   [facility] Gasper Nancyann BRAVO, MD              Passed - Weight completed in the last 3 months    Wt Readings from Last 1 Encounters:  02/19/24 278 lb (126.1 kg)

## 2024-02-23 NOTE — Telephone Encounter (Signed)
 Copied from CRM [id number]. Topic: Clinical - Medication Refill >> Feb 23, 2024  8:10 AM Everette C wrote: Medication: oxyCODONE -acetaminophen  (PERCOCET) 10-325 MG tablet [[id number]] - the patient would like to be prescribed the brand name medication if possible   phentermine  37.5 MG capsule [[id number]]  Has the patient contacted their pharmacy? Yes (Agent: If no, request that the patient contact the pharmacy for the refill. If patient does not wish to contact the pharmacy document the reason why and proceed with request.) (Agent: If yes, when and what did the pharmacy advise?)  This is the patient's preferred pharmacy:  [id number] - [place], [place] - [address] [place] [address] Phone: [phone] Fax: [phone]  Is this the correct pharmacy for this prescription? Yes If no, delete pharmacy and type the correct one.   Has the prescription been filled recently? Yes  Is the patient out of the medication? Yes  Has the patient been seen for an appointment in the last year OR does the patient have an upcoming appointment? Yes  Can we respond through MyChart? No  Agent: Please be advised that Rx refills may take up to 3 business days. We ask that you follow-up with your pharmacy.

## 2024-02-26 MED ORDER — PHENTERMINE HCL 37.5 MG PO CAPS: 37.5000 mg | ORAL_CAPSULE | ORAL | 4 refills | Status: AC

## 2024-02-26 MED ORDER — OXYCODONE-ACETAMINOPHEN 10-325 MG PO TABS: 1.0000 | ORAL_TABLET | Freq: Three times a day (TID) | ORAL | 0 refills | Status: DC | PRN

## 2024-02-26 NOTE — Telephone Encounter (Signed)
 Copied from CRM [id number]. Topic: Clinical - Medication Question >> Feb 23, 2024  2:59 PM DeAngela L wrote: Reason for CRM: patient had an appt on 02/19/24 and at the appt the patient was told the provider would submit a refill for meloxicam  (MOBIC ) 15 MG tablet oxyCODONE -acetaminophen  (PERCOCET) 10-325 MG tablet phentermine  37.5 MG capsule  But the pharmacy only filled meloxicam  (MOBIC ) 15 MG tablet and patient picked up  So the patient is calling ask if the provider ever submitted a fill for the other 2 medications at her appt on 02/19/24, cause the pharmacy never called for the other 2 fills   Pt num [phone] (M)

## 2024-03-06 ENCOUNTER — Ambulatory Visit: Payer: Self-pay

## 2024-03-06 NOTE — Telephone Encounter (Signed)
 FYI Only or Action Required?: Action required by provider: clinical question for provider.  Patient was last seen in primary care on 02/19/2024 by Myrla Jon HERO, MD.  Called Nurse Triage reporting Shortness of Breath.  Symptoms began a week ago.  Interventions attempted: Prescription medications: Albuterol  inhaler and Rest, hydration, or home remedies.  Symptoms are: unchanged.  Triage Disposition: See PCP Within 2 Weeks-asking if office has   Patient/caregiver understands and will follow disposition?: No, wishes to speak with PCP  Copied from CRM #[id number]. Topic: Clinical - Red Word Triage >> Mar 06, 2024  8:22 AM Erin Sherman wrote: Erin Sherman that prompted transfer to Nurse Triage: SOB, misplaced  Fluticasone -Umeclidin-Vilant (TRELEGY ELLIPTA ) 200-62.5-25 MCG/ACT AEPB, wont refill until 03/17/2024 Reason for Disposition  [1] MILD longstanding difficulty breathing (e.g., minimal/no SOB at rest, SOB with walking, pulse < 100) AND [2] SAME as normal  Answer Assessment - Initial Assessment Questions 1. RESPIRATORY STATUS: Describe your breathing? (e.g., wheezing, shortness of breath, unable to speak, severe coughing)      Shortness of breath, coughing, wheezing 2. ONSET: When did this breathing problem begin?      Started a week ago 3. PATTERN Does the difficult breathing come and go, or has it been constant since it started?      constant 4. SEVERITY: How bad is your breathing? (e.g., mild, moderate, severe)      Mild shortness of breath 5. RECURRENT SYMPTOM: Have you had difficulty breathing before? If Yes, ask: When was the last time? and What happened that time?      yes 6. CARDIAC HISTORY: Do you have any history of heart disease? (e.g., heart attack, angina, bypass surgery, angioplasty)      no 7. LUNG HISTORY: Do you have any history of lung disease?  (e.g., pulmonary embolus, asthma, emphysema)     asthma 8. CAUSE: What do you think is causing the  breathing problem?      Has been out of maintenance inhaler for a week due to misplacing it.  9. OTHER SYMPTOMS: Do you have any other symptoms? (e.g., chest pain, cough, dizziness, fever, runny nose)     Cough-productive 10. O2 SATURATION MONITOR:  Do you use an oxygen saturation monitor (pulse oximeter) at home? If Yes, ask: What is your reading (oxygen level) today? What is your usual oxygen saturation reading? (e.g., 95%)       N/A 12. TRAVEL: Have you traveled out of the country in the last month? (e.g., travel history, exposures)       No  Patient has been out of Trelegy inhaler for a week. Patient misplaced her inhaler and is unable to get a refill until 03/17/2024. Patient doesn't want to be seen in office. Asking if there are any samples available that patient could pick up to cover her until she can pick up the refill. Patient is asking for a call back today.  Protocols used: Breathing Difficulty-A-AH

## 2024-03-06 NOTE — Telephone Encounter (Signed)
 Called patient to make her aware we have that sample and will leave one for her.

## 2024-03-06 NOTE — Telephone Encounter (Signed)
 She can have a sample if we have any

## 2024-03-18 ENCOUNTER — Telehealth: Payer: Self-pay

## 2024-03-18 NOTE — Telephone Encounter (Unsigned)
 Copied from CRM [id number]. Topic: Clinical - Medication Question >> Mar 18, 2024  9:58 AM Sophia H wrote: Reason for CRM:   Patient states she has been sick since last week, wanting to know if Dr. Gasper can send in steroids/congestion meds to help in the mean time. Has an appt scheduled 09/17.   Please advise if able to # [phone]  [id number] - [place], [place] - [address]

## 2024-03-20 ENCOUNTER — Ambulatory Visit (INDEPENDENT_AMBULATORY_CARE_PROVIDER_SITE_OTHER): Admitting: Family Medicine

## 2024-03-20 ENCOUNTER — Encounter: Payer: Self-pay | Admitting: Family Medicine

## 2024-03-20 VITALS — BP 117/55 | HR 84 | Temp 98.7°F | Ht 60.0 in | Wt 276.3 lb

## 2024-03-20 DIAGNOSIS — J301 Allergic rhinitis due to pollen: Secondary | ICD-10-CM | POA: Diagnosis not present

## 2024-03-20 DIAGNOSIS — J4551 Severe persistent asthma with (acute) exacerbation: Secondary | ICD-10-CM | POA: Diagnosis not present

## 2024-03-20 DIAGNOSIS — R110 Nausea: Secondary | ICD-10-CM

## 2024-03-20 DIAGNOSIS — G4733 Obstructive sleep apnea (adult) (pediatric): Secondary | ICD-10-CM

## 2024-03-20 DIAGNOSIS — R053 Chronic cough: Secondary | ICD-10-CM | POA: Diagnosis not present

## 2024-03-20 MED ORDER — FLUTICASONE PROPIONATE 50 MCG/ACT NA SUSP: 2.0000 | Freq: Every day | NASAL | 1 refills | Status: AC

## 2024-03-20 MED ORDER — ONDANSETRON 4 MG PO TBDP: 4.0000 mg | ORAL_TABLET | Freq: Three times a day (TID) | ORAL | 0 refills | Status: AC | PRN

## 2024-03-20 MED ORDER — HYDROCODONE BIT-HOMATROP MBR 5-1.5 MG/5ML PO SOLN: 5.0000 mL | Freq: Three times a day (TID) | ORAL | 0 refills | Status: DC | PRN

## 2024-03-20 MED ORDER — PREDNISONE 20 MG PO TABS: ORAL_TABLET | ORAL | 0 refills | Status: DC

## 2024-03-20 NOTE — Progress Notes (Addendum)
 Established patient visit   Patient: Erin Sherman   DOB: February 03, 1966   58 y.o. Female  MRN: [id number] Visit Date: 03/20/2024  Today's healthcare provider: Nancyann Perry, MD   Chief Complaint  Patient presents with   Medical Management of Chronic Issues    Patient reports that she is here for a follow up on her asthma.  Been out of work since last week with a cough and having trouble breathing and weak.  States it started last Tuesday got up sneezing and congestion.  Productive cough, yellow phlegm when coughing.    Also has paperwork that needs to be filled out regarding CPAP supplies.  Wanting something for nausea.  I did observe her wheezing while getting her vitals.   Subjective    HPI Patient with long history of asthma reports flare up with uri sx as above over the last couple of weeks. No fevers, chills, or unusual shortness of breath. Continues Trelegy daily and using duoneb an albuterol  several times a day which helps for a few hours.   She also has Designer, industrial/product and reports she has not been able to use it recently due to asthma flares and making it difficult to breath. However she had been using it consistently all night every night prior to asthma exacerbation.   Medications: Outpatient Medications Prior to Visit  Medication Sig Note   albuterol  (VENTOLIN  HFA) 108 (90 Base) MCG/ACT inhaler INHALE 2 PUFFS BY MOUTH EVERY 6 HOURS AS NEEDED    azelastine  (ASTELIN ) 0.1 % nasal spray Place 2 sprays into both nostrils 2 (two) times daily. Use in each nostril as directed    benzonatate  (TESSALON ) 200 MG capsule TAKE 1 CAPSULE (200 MG TOTAL) BY MOUTH 3 (THREE) TIMES DAILY AS NEEDED FOR COUGH.    calcium carbonate (TUMS - DOSED IN MG ELEMENTAL CALCIUM) 500 MG chewable tablet Chew 1 tablet by mouth daily as needed for indigestion.    cholecalciferol (VITAMIN D ) 1000 UNITS tablet Take 1,000 Units by mouth daily.    diclofenac  Sodium (VOLTAREN ) 1 % GEL Apply 4 g topically 4  (four) times daily.    EPINEPHRINE  0.3 mg/0.3 mL IJ SOAJ injection INJECT 0.3 MLS (0.3 MG TOTAL) INTO THE MUSCLE AS NEEDED. WITH ALLERGIC REACTION    Fluticasone -Umeclidin-Vilant (TRELEGY ELLIPTA ) 200-62.5-25 MCG/ACT AEPB Inhale 1 Act into the lungs daily.    ipratropium-albuterol  (DUONEB) 0.5-2.5 (3) MG/3ML SOLN INHALE 3 MLS INTO THE LUNGS EVERY 6 (SIX) HOURS AS NEEDED. REPORTED ON 12/28/2015    meloxicam  (MOBIC ) 15 MG tablet Take 1 tablet (15 mg total) by mouth daily.    montelukast  (SINGULAIR ) 10 MG tablet TAKE 1 TABLET BY MOUTH EVERYDAY AT BEDTIME    oxyCODONE -acetaminophen  (PERCOCET) 10-325 MG tablet Take 1 tablet by mouth every 8 (eight) hours as needed for pain.    phentermine  37.5 MG capsule Take 1 capsule (37.5 mg total) by mouth every morning. DO NOT TAKE THIS MEDICATION WHEN TAKING ZYRTEC -D    sodium chloride  HYPERTONIC 3 % nebulizer solution Use one- 15 mL ampule, up to 4 times daily, as needed, via nebulizer for cough/chest tightness.    Spacer/Aero-Holding Chambers (OPTICHAMBER ADVANTAGE) MISC 1 each by Other route once. Always uses her when you're using a metered-dose inhaler. You've aromatase medicine as much, he won't have his much side effect, but you it twice as much medicine and your lungs.    triamcinolone  cream (KENALOG ) 0.5 % Apply 1 Application topically 3 (three) times daily.    [  DISCONTINUED] fluticasone  (FLONASE ) 50 MCG/ACT nasal spray SPRAY 2 SPRAYS INTO EACH NOSTRIL EVERY DAY    [DISCONTINUED] HYDROcodone  bit-homatropine (HYCODAN) 5-1.5 MG/5ML syrup Take 5 mLs by mouth every 8 (eight) hours as needed for cough.    [DISCONTINUED] levalbuterol  (XOPENEX  HFA) 45 MCG/ACT inhaler Inhale 2 puffs into the lungs every 6 (six) hours as needed for wheezing. May substitute generic 03/20/2024: previously  changed to albuterol    [DISCONTINUED] ondansetron  (ZOFRAN -ODT) 4 MG disintegrating tablet Take 1 tablet (4 mg total) by mouth every 8 (eight) hours as needed for nausea or vomiting.     No facility-administered medications prior to visit.    Review of Systems     Objective    BP (!) 117/55 (BP Location: Left Arm, Patient Position: Sitting, Cuff Size: Large)   Pulse 84   Temp 98.7 F (37.1 C) (Oral)   Ht 5' (1.524 m)   Wt 276 lb 4.8 oz (125.3 kg)   SpO2 97%   BMI 53.96 kg/m    Physical Exam   General: Appearance:    Obese female in no acute distress  Eyes:    PERRL, conjunctiva/corneas clear, EOM's intact       Lungs:     Rare expiratory wheezes, good air movement, respirations unlabored  Heart:    Normal heart rate. Normal rhythm. No murmurs, rubs, or gallops.    MS:   All extremities are intact.    Neurologic:   Awake, alert, oriented x 3. No apparent focal neurological defect.         Assessment & Plan     1. Chronic cough (Primary) Secondary to asthma as below.  refill- HYDROcodone  bit-homatropine (HYCODAN) 5-1.5 MG/5ML syrup; Take 5 mLs by mouth every 8 (eight) hours as needed for cough.  Dispense: 120 mL; Refill: 0  2. Severe persistent asthma with acute exacerbation Slowly improving, but not  enough to return to work yet.  - predniSONE  (DELTASONE ) 20 MG tablet; Take 60mg  PO daily x 2 days, then40mg  PO daily x 2 days, then 20mg  PO daily x 3 days  Dispense: 13 tablet; Refill: 0  Out of work since Wednesday 03/13/24, anticipate RTW within the next day of two.   3. Nausea refill - ondansetron  (ZOFRAN -ODT) 4 MG disintegrating tablet; Take 1 tablet (4 mg total) by mouth every 8 (eight) hours as needed for nausea or vomiting.  Dispense: 16 tablet; Refill: 0  4. Allergic rhinitis due to pollen, unspecified seasonality refill - fluticasone  (FLONASE ) 50 MCG/ACT nasal spray; Place 2 sprays into both nostrils daily.  Dispense: 48 mL; Refill: 1  5 Mild obstructive sleep apnea Continues to require use of CPAP every night which she uses consistently when asthma is not flared up, and medically benefiting from its use.           Nancyann Perry, MD   [facility] [phone] (phone) [phone] (fax)  [facility]

## 2024-04-04 ENCOUNTER — Other Ambulatory Visit: Payer: Self-pay | Admitting: Family Medicine

## 2024-04-04 DIAGNOSIS — J4541 Moderate persistent asthma with (acute) exacerbation: Secondary | ICD-10-CM

## 2024-04-04 DIAGNOSIS — R21 Rash and other nonspecific skin eruption: Secondary | ICD-10-CM

## 2024-04-04 DIAGNOSIS — L282 Other prurigo: Secondary | ICD-10-CM

## 2024-04-19 ENCOUNTER — Encounter: Payer: Self-pay | Admitting: Physician Assistant

## 2024-04-19 ENCOUNTER — Telehealth: Payer: Self-pay

## 2024-04-19 ENCOUNTER — Ambulatory Visit: Admitting: Physician Assistant

## 2024-04-19 ENCOUNTER — Ambulatory Visit: Payer: Self-pay

## 2024-04-19 VITALS — BP 128/67 | HR 91 | Resp 16 | Ht 60.0 in | Wt 288.1 lb

## 2024-04-19 DIAGNOSIS — R110 Nausea: Secondary | ICD-10-CM

## 2024-04-19 DIAGNOSIS — J301 Allergic rhinitis due to pollen: Secondary | ICD-10-CM | POA: Diagnosis not present

## 2024-04-19 DIAGNOSIS — Z91040 Latex allergy status: Secondary | ICD-10-CM | POA: Diagnosis not present

## 2024-04-19 DIAGNOSIS — J45909 Unspecified asthma, uncomplicated: Secondary | ICD-10-CM

## 2024-04-19 DIAGNOSIS — J4540 Moderate persistent asthma, uncomplicated: Secondary | ICD-10-CM | POA: Diagnosis not present

## 2024-04-19 MED ORDER — PROMETHAZINE HCL 25 MG PO TABS: 25.0000 mg | ORAL_TABLET | Freq: Three times a day (TID) | ORAL | 0 refills | Status: AC | PRN

## 2024-04-19 NOTE — Telephone Encounter (Signed)
 FYI Only or Action Required?: FYI only for provider.  Patient was last seen in primary care on 03/20/2024 by Gasper Nancyann BRAVO, MD.  Called Nurse Triage reporting Headache.  Symptoms began several days ago.  Interventions attempted: OTC medications: Tylenol , flonase  and Prescription medications: Zofran .  Symptoms are: gradually worsening.  Triage Disposition: See Physician Within 24 Hours  Patient/caregiver understands and will follow disposition?: Yes  Copied from CRM [id number]. Topic: Clinical - Red Word Triage >> Apr 19, 2024 10:11 AM Ivette P wrote: [facility] that prompted transfer to Nurse Triage: Headache running down right side head to the neck - lights bothering Reason for Disposition  [1] MODERATE headache (e.g., interferes with normal activities) AND [2] present > 24 hours AND [3] unexplained  (Exceptions: Pain medicines not tried, typical migraine, or headache part of viral illness.)  Answer Assessment - Initial Assessment Questions 1. LOCATION: Where does it hurt?      Right side of head, right side of mouth, and right side of neck  2. ONSET: When did the headache start? (e.g., minutes, hours, days)      3 days ago  3. PATTERN: Does the pain come and go, or has it been constant since it started?     Intermittent. Headache lasts for a while per pt.   4. SEVERITY: How bad is the pain? and What does it keep you from doing?  (e.g., Scale 1-10; mild, moderate, or severe)     Sharp 8/10 pain. Taking tylenol  which has been effective.  5. RECURRENT SYMPTOM: Have you ever had headaches before? If Yes, ask: When was the last time? and What happened that time?       Hx of migraines in the past, has gone to the hospital to get a migraine cocktail.  6. CAUSE: What do you think is causing the headache?     Sinus infections on right side of head which is normally accompanied by a headache. Pt reports feeling like she has a sinus infection now with congestion and  sinus pain. Taking flonase . Reports grand children currently have colds.  7. MIGRAINE: Have you been diagnosed with migraine headaches? If Yes, ask: Is this headache similar?      Hx of migraines in the past, has gone to the hospital to get a migraine cocktail.  8. HEAD INJURY: Has there been any recent injury to your head?      Concussion years ago, no recent injuries.  9. OTHER SYMPTOMS: Do you have any other symptoms? (e.g., fever, stiff neck, eye pain, sore throat, cold symptoms)     Nausea, no emesis. Taking zofran , effective. Bright lights exacerbate headache. No changes to vision.  Protocols used: [facility]

## 2024-04-19 NOTE — Telephone Encounter (Signed)
 LOV 04/19/24 acute visit  NOV no new ov LRF oxy. 02/26/24 qty:30 r:0 LRF Phenergan  (not on med list or in dispensary) Pt is requesting change in therapy for zofran  as it does not help want phenergan .

## 2024-04-21 NOTE — Progress Notes (Signed)
 Established patient visit  Patient: Erin Sherman   DOB: 03/30/1966   58 y.o. Female  MRN: [id number] Visit Date: 04/19/2024  Today's healthcare provider: Jolynn Spencer, PA-C   Chief Complaint  Patient presents with   Acute Visit    Headache, nausea x 3 days  Sinus infection- nasal congestion Wants Phenergan  medication to help with nausea and headaches   Subjective     HPI     Acute Visit    Additional comments: Headache, nausea x 3 days  Sinus infection- nasal congestion Wants Phenergan  medication to help with nausea and headaches      Last edited by Wilfred Hargis GORMAN, CMA on 04/19/2024  4:26 PM.       Discussed the use of AI scribe software for clinical note transcription with the patient, who gave verbal consent to proceed.  History of Present Illness Erin Sherman is a 58 year old female with asthma and bronchitis who presents with nausea and wheezing.  She experiences persistent nausea unrelieved by Zofran . Bright lights exacerbate her symptoms, causing ocular discomfort. She has previously used Phenergan  for similar symptoms. Wheezing is present, with a background of asthma and bronchitis. Current medications include Trelegy daily, albuterol  as needed, Singulair  nightly, and Flonase  for allergies. She uses a CPAP machine at night. Post-nasal drainage and congestion are described as 'fluidy'. She sleeps only four hours due to drainage and congestion affecting her rest, raising concerns about her ability to work the next day.       03/20/2024   11:05 AM 10/06/2023    4:16 PM 07/07/2023    4:05 PM  Depression screen PHQ 2/9  Decreased Interest 0 0 0  Down, Depressed, Hopeless 0 0 0  PHQ - 2 Score 0 0 0  Altered sleeping 0 0 2  Tired, decreased energy 3 1 2   Change in appetite 0  0  Feeling bad or failure about yourself  0 0 0  Trouble concentrating 0 0 0  Moving slowly or fidgety/restless 0 0 0  Suicidal thoughts 0 0 0  PHQ-9 Score 3 1 4   Difficult  doing work/chores Not difficult at all Not difficult at all Not difficult at all      03/20/2024   11:06 AM 07/07/2023    4:05 PM  GAD 7 : Generalized Anxiety Score  Nervous, Anxious, on Edge 0 0  Control/stop worrying 0 0  Worry too much - different things 0 0  Trouble relaxing 0 0  Restless 0 0  Easily annoyed or irritable 0 0  Afraid - awful might happen 0 0  Total GAD 7 Score 0 0  Anxiety Difficulty Not difficult at all     Medications: Outpatient Medications Prior to Visit  Medication Sig   albuterol  (VENTOLIN  HFA) 108 (90 Base) MCG/ACT inhaler INHALE 2 PUFFS BY MOUTH EVERY 6 HOURS AS NEEDED   azelastine  (ASTELIN ) 0.1 % nasal spray Place 2 sprays into both nostrils 2 (two) times daily. Use in each nostril as directed   benzonatate  (TESSALON ) 200 MG capsule TAKE 1 CAPSULE (200 MG TOTAL) BY MOUTH 3 (THREE) TIMES DAILY AS NEEDED FOR COUGH.   calcium carbonate (TUMS - DOSED IN MG ELEMENTAL CALCIUM) 500 MG chewable tablet Chew 1 tablet by mouth daily as needed for indigestion.   cholecalciferol (VITAMIN D ) 1000 UNITS tablet Take 1,000 Units by mouth daily.   diclofenac  Sodium (VOLTAREN ) 1 % GEL Apply 4 g topically 4 (four) times daily.   EPINEPHRINE  0.3 mg/0.3  mL IJ SOAJ injection INJECT 0.3 MLS (0.3 MG TOTAL) INTO THE MUSCLE AS NEEDED. WITH ALLERGIC REACTION   fluticasone  (FLONASE ) 50 MCG/ACT nasal spray Place 2 sprays into both nostrils daily.   Fluticasone -Umeclidin-Vilant (TRELEGY ELLIPTA ) 200-62.5-25 MCG/ACT AEPB Inhale 1 Act into the lungs daily.   HYDROcodone  bit-homatropine (HYCODAN) 5-1.5 MG/5ML syrup Take 5 mLs by mouth every 8 (eight) hours as needed for cough.   ipratropium-albuterol  (DUONEB) 0.5-2.5 (3) MG/3ML SOLN INHALE 3 MLS INTO THE LUNGS EVERY 6 (SIX) HOURS AS NEEDED. REPORTED ON 12/28/2015   meloxicam  (MOBIC ) 15 MG tablet Take 1 tablet (15 mg total) by mouth daily.   montelukast  (SINGULAIR ) 10 MG tablet TAKE 1 TABLET BY MOUTH EVERYDAY AT BEDTIME   ondansetron   (ZOFRAN -ODT) 4 MG disintegrating tablet Take 1 tablet (4 mg total) by mouth every 8 (eight) hours as needed for nausea or vomiting.   oxyCODONE -acetaminophen  (PERCOCET) 10-325 MG tablet Take 1 tablet by mouth every 8 (eight) hours as needed for pain.   phentermine  37.5 MG capsule Take 1 capsule (37.5 mg total) by mouth every morning. DO NOT TAKE THIS MEDICATION WHEN TAKING ZYRTEC -D   predniSONE  (DELTASONE ) 20 MG tablet Take 60mg  PO daily x 2 days, then40mg  PO daily x 2 days, then 20mg  PO daily x 3 days   sodium chloride  HYPERTONIC 3 % nebulizer solution Use one- 15 mL ampule, up to 4 times daily, as needed, via nebulizer for cough/chest tightness.   Spacer/Aero-Holding Chambers (OPTICHAMBER ADVANTAGE) MISC 1 each by Other route once. Always uses her when you're using a metered-dose inhaler. You've aromatase medicine as much, he won't have his much side effect, but you it twice as much medicine and your lungs.   triamcinolone  cream (KENALOG ) 0.5 % APPLY TOPICALLY 3 TIMES A DAY   No facility-administered medications prior to visit.    Review of Systems All negative Except see HPI       Objective    BP 128/67   Pulse 91   Resp 16   Ht 5' (1.524 m)   Wt 288 lb 1.6 oz (130.7 kg)   BMI 56.27 kg/m     Physical Exam Vitals reviewed.  Constitutional:      General: She is not in acute distress.    Appearance: Normal appearance. She is well-developed and normal weight. She is not diaphoretic.  HENT:     Head: Normocephalic and atraumatic.     Right Ear: Ear canal and external ear normal.     Left Ear: Ear canal and external ear normal.     Nose: Congestion and rhinorrhea present.     Mouth/Throat:     Pharynx: No posterior oropharyngeal erythema.     Comments: Postnasal drainage noted Eyes:     General: No scleral icterus.       Right eye: No discharge.        Left eye: No discharge.     Extraocular Movements: Extraocular movements intact.     Conjunctiva/sclera: Conjunctivae  normal.     Pupils: Pupils are equal, round, and reactive to light.  Neck:     Thyroid: No thyromegaly.  Cardiovascular:     Rate and Rhythm: Normal rate and regular rhythm.     Pulses: Normal pulses.     Heart sounds: Normal heart sounds. No murmur heard. Pulmonary:     Effort: Pulmonary effort is normal. No respiratory distress.     Breath sounds: Normal breath sounds. No wheezing, rhonchi or rales.  Abdominal:  General: Abdomen is flat. Bowel sounds are normal.     Palpations: Abdomen is soft.  Musculoskeletal:     Cervical back: Neck supple.     Right lower leg: No edema.     Left lower leg: No edema.  Lymphadenopathy:     Cervical: No cervical adenopathy.  Skin:    General: Skin is warm and dry.     Findings: No rash.  Neurological:     Mental Status: She is alert and oriented to person, place, and time. Mental status is at baseline.  Psychiatric:        Mood and Affect: Mood normal.        Behavior: Behavior normal.      No results found for any visits on 04/19/24.      Assessment & Plan Asthma Chronic, moderate  and stable for now Normal PE  - Continue Trelegy once daily. - Use albuterol  as needed for wheezing and cough as needed Will follow-up   Allergic rhinitis with recurrent sinusitis - Incorporate nasal saline spray or gel with aloe vera before using Flonase . - Discuss with pharmacist about preference for nasal spray or gel. - Use neti pot for nasal irrigation if available. - Consider adding an oral antihistamine like Zyrtec  or Allegra if symptoms persist. - Use warm compresses for symptom relief/right side  Obstructive sleep apnea Chronic and stable Obstructive sleep apnea managed with CPAP. Will follow-up  Nausea Chronic Previously used Zofran  with limited relief. - Check with Dr. Gasper regarding the appropriateness of prescribing Phenergan . Will follow-up  Latex allergy  Confirmed latex allergy . No exposure during this visit as nitrile  gloves were used during the physical exam - Ensure use of non-latex gloves in all medical settings.   No orders of the defined types were placed in this encounter.   No follow-ups on file.   The patient was advised to call back or seek an in-person evaluation if the symptoms worsen or if the condition fails to improve as anticipated.  I discussed the assessment and treatment plan with the patient. The patient was provided an opportunity to ask questions and all were answered. The patient agreed with the plan and demonstrated an understanding of the instructions.  I, Marquett Bertoli, PA-C have reviewed all documentation for this visit. The documentation on 04/19/2024  for the exam, diagnosis, procedures, and orders are all accurate and complete.  Jolynn Spencer, [facility], MMS [facility] [phone] (phone) [phone] (fax)  [facility]

## 2024-05-01 ENCOUNTER — Emergency Department
Admission: EM | Admit: 2024-05-01 | Discharge: 2024-05-01 | Disposition: A | Discharge status: Home / Self Care | Attending: Emergency Medicine | Admitting: Emergency Medicine

## 2024-05-01 ENCOUNTER — Other Ambulatory Visit: Payer: Self-pay

## 2024-05-01 DIAGNOSIS — S39012A Strain of muscle, fascia and tendon of lower back, initial encounter: Secondary | ICD-10-CM | POA: Insufficient documentation

## 2024-05-01 DIAGNOSIS — S139XXA Sprain of joints and ligaments of unspecified parts of neck, initial encounter: Secondary | ICD-10-CM | POA: Insufficient documentation

## 2024-05-01 DIAGNOSIS — Y92410 Unspecified street and highway as the place of occurrence of the external cause: Secondary | ICD-10-CM | POA: Insufficient documentation

## 2024-05-01 MED ORDER — METHOCARBAMOL 500 MG PO TABS: 500.0000 mg | ORAL_TABLET | Freq: Every evening | ORAL | 1 refills | Status: AC | PRN

## 2024-05-01 MED ORDER — KETOROLAC TROMETHAMINE 30 MG/ML IJ SOLN
30.0000 mg | Freq: Once | INTRAMUSCULAR | Status: AC
  Administered 2024-05-01: 30 mg via INTRAMUSCULAR
  Filled 2024-05-01: qty 1

## 2024-05-01 MED ORDER — NAPROXEN 500 MG PO TABS
500.0000 mg | ORAL_TABLET | Freq: Two times a day (BID) | ORAL | 2 refills | Status: AC
Start: 2024-05-01 — End: ?
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
## 2024-05-02 ENCOUNTER — Telehealth: Admitting: Physician Assistant

## 2024-05-02 DIAGNOSIS — M25511 Pain in right shoulder: Secondary | ICD-10-CM
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
## 2024-05-02 NOTE — Patient Instructions (Signed)
 Erin Sherman Sherman, thank you for joining Erin Sherman Velma Lunger, PA-C for today's virtual visit.  While this provider is not your primary care provider (PCP), if your PCP is located in our provider database this encounter information will be shared with them immediately following your visit.   A [facility] MyChart account gives you access to today's visit and all your visits, tests, and labs performed at [facility]  click here if you don't have a Erin Sherman Sherman MyChart account or go to [email or web address]  Consent: (Patient) Erin Sherman Sherman provided verbal consent for this virtual visit at the beginning of the encounter.  Current Medications:  Current Outpatient Medications:    albuterol  (VENTOLIN  HFA) 108 (90 Base) MCG/ACT inhaler, INHALE 2 PUFFS BY MOUTH EVERY 6 HOURS AS NEEDED, Disp: 18 each, Rfl: 5   azelastine  (ASTELIN ) 0.1 % nasal spray, Place 2 sprays into both nostrils 2 (two) times daily. Use in each nostril as directed, Disp: 30 mL, Rfl: 12   calcium carbonate (TUMS - DOSED IN MG ELEMENTAL CALCIUM) 500 MG chewable tablet, Chew 1 tablet by mouth daily as needed for indigestion., Disp: , Rfl:    cholecalciferol (VITAMIN D ) 1000 UNITS tablet, Take 1,000 Units by mouth daily., Disp: , Rfl:    EPINEPHRINE  0.3 mg/0.3 mL IJ SOAJ injection, INJECT 0.3 MLS (0.3 MG TOTAL) INTO THE MUSCLE AS NEEDED. WITH ALLERGIC REACTION, Disp: 2 each, Rfl: 1   fluticasone  (FLONASE ) 50 MCG/ACT nasal spray, Place 2 sprays into both nostrils daily., Disp: 48 mL, Rfl: 1   Fluticasone -Umeclidin-Vilant (TRELEGY ELLIPTA ) 200-62.5-25 MCG/ACT AEPB, Inhale 1 Act into the lungs daily., Disp: 1 each, Rfl: 10   ipratropium-albuterol  (DUONEB) 0.5-2.5 (3) MG/3ML SOLN, INHALE 3 MLS INTO THE LUNGS EVERY 6 (SIX) HOURS AS NEEDED. REPORTED ON 12/28/2015, Disp: 360 mL, Rfl: 2   methocarbamol  (ROBAXIN ) 500 MG tablet, Take 1 tablet (500 mg total) by mouth at bedtime as needed for muscle spasms., Disp: 20 tablet,  Rfl: 1   montelukast  (SINGULAIR ) 10 MG tablet, TAKE 1 TABLET BY MOUTH EVERYDAY AT BEDTIME, Disp: 90 tablet, Rfl: 3   naproxen  (NAPROSYN ) 500 MG tablet, Take 1 tablet (500 mg total) by mouth 2 (two) times daily with a meal., Disp: 20 tablet, Rfl: 2   ondansetron  (ZOFRAN -ODT) 4 MG disintegrating tablet, Take 1 tablet (4 mg total) by mouth every 8 (eight) hours as needed for nausea or vomiting., Disp: 16 tablet, Rfl: 0   phentermine  37.5 MG capsule, Take 1 capsule (37.5 mg total) by mouth every morning. DO NOT TAKE THIS MEDICATION WHEN TAKING ZYRTEC -D, Disp: 30 capsule, Rfl: 4   promethazine  (PHENERGAN ) 25 MG tablet, Take 1 tablet (25 mg total) by mouth every 8 (eight) hours as needed for nausea or vomiting., Disp: 20 tablet, Rfl: 0   sodium chloride  HYPERTONIC 3 % nebulizer solution, Use one- 15 mL ampule, up to 4 times daily, as needed, via nebulizer for cough/chest tightness., Disp: 450 mL, Rfl: 0   Spacer/Aero-Holding Chambers (OPTICHAMBER ADVANTAGE) MISC, 1 each by Other route once. Always uses her when you're using a metered-dose inhaler. You've aromatase medicine as much, he won't have his much side effect, but you it twice as much medicine and your lungs., Disp: 1 each, Rfl: 0   triamcinolone  cream (KENALOG ) 0.5 %, APPLY TOPICALLY 3 TIMES A DAY, Disp: 45 g, Rfl: 2   Medications ordered in this encounter:  No orders of the defined types were placed in this encounter.    *If you need refills  on other medications prior to your next appointment, please contact your pharmacy*  Follow-Up: Call back or seek an in-person evaluation if the symptoms worsen or if the condition fails to improve as anticipated.  [facility] [phone]  Other Instructions Please continue medications as directed by the ER. Make sure not to take on an empty stomach. Rest. Consider heating pad for 10-15 minutes at a time of the neck and shoulder. If you note any non-resolving, new, or worsening symptoms  despite treatment, please seek an in-person evaluation ASAP.    If you have been instructed to have an in-person evaluation today at a local Urgent Care facility, please use the link below. It will take you to a list of all of our available [facility], including address, phone number and hours of operation. Please do not delay care.  [facility]  If you or a family member do not have a primary care provider, use the link below to schedule a visit and establish care. When you choose a [facility] primary care physician or advanced practice provider, you gain a long-term partner in health. Find a Primary Care Provider  Learn more about [facility]'s in-office and virtual care options: [facility] - Get Care Now

## 2024-05-20 ENCOUNTER — Ambulatory Visit

## 2024-05-20 ENCOUNTER — Ambulatory Visit: Payer: Self-pay | Admitting: *Deleted

## 2024-05-20 VITALS — BP 127/61 | Resp 16 | Ht 60.0 in | Wt 285.0 lb

## 2024-05-20 DIAGNOSIS — J4551 Severe persistent asthma with (acute) exacerbation: Secondary | ICD-10-CM | POA: Diagnosis not present

## 2024-05-20 MED ORDER — PREDNISONE 20 MG PO TABS: 40.0000 mg | ORAL_TABLET | Freq: Every day | ORAL | 0 refills | Status: AC

## 2024-05-20 NOTE — Telephone Encounter (Signed)
 FYI Only or Action Required?: FYI only for provider: appointment scheduled on 11/17.  Patient was last seen in primary care on 05/02/2024 by Gladis Elsie BROCKS, PA-C.  Called Nurse Triage reporting Asthma.  Symptoms began several days ago.  Interventions attempted: Prescription medications: Albuterol .  Symptoms are: gradually worsening.  Triage Disposition: See HCP Within 4 Hours (Or PCP Triage)  Patient/caregiver understands and will follow disposition?:   Copied from CRM #[id number]. Topic: Clinical - Red Word Triage >> May 20, 2024 11:55 AM Erin Sherman wrote: Red Word that prompted transfer to Nurse Triage: Pt has a asthma flare up, also experiencing  Bodyache, when pt coughs side hurts, wheezing, chest tightness Reason for Disposition  [1] MILD difficulty breathing (e.g., minimal/no SOB at rest, SOB with walking, pulse < 100) AND [2] still present when not coughing  Answer Assessment - Initial Assessment Questions 1. ONSET: When did the cough begin?      Started over the weekend 2. SEVERITY: How bad is the cough today?      Non productive 3. SPUTUM: Describe the color of your sputum (e.g., none, dry cough; clear, white, yellow, green)     none 4. HEMOPTYSIS: Are you coughing up any blood? If Yes, ask: How much? (e.g., flecks, streaks, tablespoons, etc.)     na 5. DIFFICULTY BREATHING: Are you having difficulty breathing? If Yes, ask: How bad is it? (e.g., mild, moderate, severe)      SOB- talking- patient is using her inhaler- using more often 6. FEVER: Do you have a fever? If Yes, ask: What is your temperature, how was it measured, and when did it start?     no 7. CARDIAC HISTORY: Do you have any history of heart disease? (e.g., heart attack, congestive heart failure)      no 8. LUNG HISTORY: Do you have any history of lung disease?  (e.g., pulmonary embolus, asthma, emphysema)     asthma 9. PE RISK FACTORS: Do you have a history of blood clots? (or:  recent major surgery, recent prolonged travel, bedridden)     no 10. OTHER SYMPTOMS: Do you have any other symptoms? (e.g., runny nose, wheezing, chest pain)       Body ache, wheezing, chest tightness  12. TRAVEL: Have you traveled out of the country in the last month? (e.g., travel history, exposures)       Grandchildren have been sick  Protocols used: Cough - Acute Non-Productive-A-AH

## 2024-05-20 NOTE — Progress Notes (Signed)
 Acute visit   Patient: Erin Sherman   DOB: [birth year]   58 y.o. Female  MRN: [id number] PCP: Gasper Nancyann BRAVO, MD   Chief Complaint  Patient presents with   Acute Visit     cough, asthma flare( since sat with worsning over time0   Subjective    Discussed the use of AI scribe software for clinical note transcription with the patient, who gave verbal consent to proceed.  History of Present Illness Erin Sherman is a 58 year old female with asthma who presents with a cough and asthma flare.  She has been experiencing a cough and difficulty breathing since Saturday, which she attributes to an asthma flare. She uses her Trelegy inhaler once daily and her albuterol  inhaler three times a day. She also takes montelukast  at night but has skipped doses recently due to concerns about drowsiness when combined with a muscle relaxer. Her symptoms include feeling out of breath and fatigued, especially when moving around. Cold air and physical exertion, such as walking from the parking deck to her workplace, exacerbate her symptoms.  She denies fever or chills but has sneezing and sinus pain. She also experiences a sore throat and sinus congestion, which she attributes to exposure to cold air and possibly an allergic reaction. She has been using Flonase  nasal spray and keeps her medications, including her inhalers and nasal spray, readily accessible in her car.  Her grandson has been sneezing and coughing, which she relates to her own symptoms. She has a nebulizer at home and uses it, especially in the mornings before work. She has not been able to work due to her symptoms.   Review of systems as noted in HPI.   Objective    BP 127/61 (BP Location: Left Arm, Patient Position: Sitting, Cuff Size: Normal)   Resp 16   Ht 5' (1.524 m)   Wt 285 lb (129.3 kg)   SpO2 100%   BMI 55.66 kg/m  Physical Exam Constitutional:      Appearance: Normal appearance.  HENT:     Head:  Normocephalic and atraumatic.     Mouth/Throat:     Mouth: Mucous membranes are moist.  Eyes:     Pupils: Pupils are equal, round, and reactive to light.  Cardiovascular:     Rate and Rhythm: Normal rate and regular rhythm.     Heart sounds: Normal heart sounds.  Pulmonary:     Effort: Pulmonary effort is normal. No respiratory distress.     Breath sounds: Wheezing present. No rhonchi.  Skin:    General: Skin is warm.  Neurological:     General: No focal deficit present.     Mental Status: She is alert.       No results found for any visits on 05/20/24.  Assessment & Plan     Problem List Items Addressed This Visit   None Visit Diagnoses       Severe persistent asthma with acute exacerbation (HCC)    -  Primary   Relevant Medications   predniSONE  (DELTASONE ) 20 MG tablet      Assessment & Plan Asthma with acute exacerbation Acute exacerbation with increased coughing, wheezing, and dyspnea. Currently on Trelegy, which she takes daily as prescribed. Has been taking albuterol  as needed. - Prescribed prednisone  40 mg daily for 5 days. - Continue albuterol  inhaler as needed, every 2-4 hours if necessary. - Continue Trelegy once daily. - Use DuoNeb at home as needed. - Provided work  note for absence until Thursday - Continue Flonase  nasal spray.    Meds ordered this encounter  Medications   predniSONE  (DELTASONE ) 20 MG tablet    Sig: Take 2 tablets (40 mg total) by mouth daily with breakfast for 5 days.    Dispense:  10 tablet    Refill:  0     No follow-ups on file.      Isaiah DELENA Pepper, MD  [facility] [phone] (phone) [phone] (fax)

## 2024-05-21 ENCOUNTER — Other Ambulatory Visit: Payer: Self-pay | Admitting: Surgery

## 2024-05-21 ENCOUNTER — Telehealth: Payer: Self-pay | Admitting: Family Medicine

## 2024-05-21 DIAGNOSIS — S39012A Strain of muscle, fascia and tendon of lower back, initial encounter: Secondary | ICD-10-CM

## 2024-05-21 DIAGNOSIS — S161XXA Strain of muscle, fascia and tendon at neck level, initial encounter: Secondary | ICD-10-CM

## 2024-05-21 NOTE — Telephone Encounter (Signed)
 Received a fax from [place] for [employer]THROP GRUMMAN.  Placed in Dr. Lyle box.

## 2024-05-22 ENCOUNTER — Telehealth: Payer: Self-pay

## 2024-05-22 ENCOUNTER — Other Ambulatory Visit: Payer: Self-pay

## 2024-05-22 ENCOUNTER — Ambulatory Visit: Payer: Self-pay | Admitting: Family Medicine

## 2024-05-22 DIAGNOSIS — J0110 Acute frontal sinusitis, unspecified: Secondary | ICD-10-CM

## 2024-05-22 MED ORDER — AZITHROMYCIN 250 MG PO TABS: ORAL_TABLET | ORAL | 0 refills | Status: DC

## 2024-05-22 MED ORDER — AMOXICILLIN-POT CLAVULANATE 875-125 MG PO TABS: 1.0000 | ORAL_TABLET | Freq: Two times a day (BID) | ORAL | 0 refills | Status: AC

## 2024-05-22 NOTE — Telephone Encounter (Signed)
 FYI Only or Action Required?: Action required by provider: update on patient condition.  Patient was last seen in primary care on 05/20/2024 by Franchot Isaiah LABOR, MD.  Called Nurse Triage reporting Cough.  Symptoms began several days ago.  Interventions attempted: Prescription medications: predniSONE  (DELTASONE ) 20 MG tablet.  Symptoms are: unchanged.  Triage Disposition: See HCP Within 4 Hours (Or PCP Triage)  Patient/caregiver understands and will follow disposition?: No, wishes to speak with PCP        Copied from CRM #[id number]. Topic: Clinical - Red Word Triage >> May 22, 2024  1:27 PM Erin Sherman wrote: Red Word that prompted transfer to Nurse Triage: Symptoms are getting worse,  Cough, side hurts , head hurts, wheezing, sore throat Reason for Disposition  Wheezing is present  Answer Assessment - Initial Assessment Questions This RN recommends pt is seen within 4 hours. Pt does not want to come in for an appt since she was seen on 11/17. Pt is wanting something else prescribed. Pt call back [phone].   Nonproductive cough Onset: over the weekend Was seen in office on 11/17, prescribed a steroid  Chest hurts from coughing Headache Wheezing Sore throat  Denies fever  Protocols used: Cough - Acute Non-Productive-A-AH

## 2024-05-22 NOTE — Telephone Encounter (Signed)
 [facility]

## 2024-05-22 NOTE — Telephone Encounter (Signed)
 Prescription canceled, Dr. Franchot had already sent in Augmentin  for the patient.

## 2024-05-22 NOTE — Telephone Encounter (Signed)
 Copied from CRM [id number]. Topic: Clinical - Medical Advice >> May 22, 2024  1:20 PM Amy B wrote: Reason for CRM: Patient requests a call back.  She states she is not feeling any better and is requesting a prescription.  She has head congestion and severe cough.

## 2024-05-22 NOTE — Telephone Encounter (Signed)
 Please let patient know that I sent an antibiotic (Augmentin ) to her pharmacy. If she is not feeling better within a few days, then I recommend she be seen in clinic or urgent care,

## 2024-05-22 NOTE — Telephone Encounter (Signed)
 Patient advised.

## 2024-05-24 DIAGNOSIS — Z0279 Encounter for issue of other medical certificate: Secondary | ICD-10-CM

## 2024-05-26 NOTE — Telephone Encounter (Signed)
 Completed 05/24/2024

## 2024-05-29 ENCOUNTER — Ambulatory Visit: Payer: Self-pay

## 2024-05-29 ENCOUNTER — Other Ambulatory Visit: Payer: Self-pay | Admitting: Family Medicine

## 2024-05-29 ENCOUNTER — Telehealth: Payer: Self-pay

## 2024-05-29 DIAGNOSIS — M25561 Pain in right knee: Secondary | ICD-10-CM

## 2024-05-29 MED ORDER — PREDNISONE 10 MG PO TABS: ORAL_TABLET | ORAL | 0 refills | Status: AC

## 2024-05-29 MED ORDER — OXYCODONE-ACETAMINOPHEN 10-325 MG PO TABS: 1.0000 | ORAL_TABLET | Freq: Three times a day (TID) | ORAL | 0 refills | Status: DC | PRN

## 2024-05-29 NOTE — Telephone Encounter (Signed)
 FYI Only or Action Required?: Action required by provider: medication refill request.  Patient was last seen in primary care on 05/20/2024 by Franchot Isaiah LABOR, MD.  Called Nurse Triage reporting Right knee.  Symptoms began several days ago.  Interventions attempted: Prescription medications: oxycodone .  Symptoms are: unchanged.Requests refill on oxycodone , not on medication list. Also a refill on the steroids and He needs to change my FMLA papers from 3 days to 5 days. Pease advise pt.  Triage Disposition: See Physician Within 24 Hours  Patient/caregiver understands and will follow disposition?: No, wishes to speak with PCP     Copied from CRM #[id number]. Topic: Clinical - Red Word Triage >> May 29, 2024  8:45 AM Erin Sherman wrote: Red Word that prompted transfer to Nurse Triage: arthritis pain, requesting oxycodone  Reason for Disposition  [1] Very swollen joint AND [2] no fever  Answer Assessment - Initial Assessment Questions 1. LOCATION and RADIATION: Where is the pain located?      Right knee and hip 2. QUALITY: What does the pain feel like?  (e.g., sharp, dull, aching, burning)     ache 3. SEVERITY: How bad is the pain? What does it keep you from doing?   (Scale 1-10; or mild, moderate, severe)     9 4. ONSET: When did the pain start? Does it come and go, or is it there all the time?     For awhile 5. RECURRENT: Have you had this pain before? If Yes, ask: When, and what happened then?     yes 6. SETTING: Has there been any recent work, exercise or other activity that involved that part of the body?      no 7. AGGRAVATING FACTORS: What makes the knee pain worse? (e.g., walking, climbing stairs, running)     walking 8. ASSOCIATED SYMPTOMS: Is there any swelling or redness of the knee?     swelling 9. OTHER SYMPTOMS: Do you have any other symptoms? (e.g., calf pain, chest pain, difficulty breathing, fever)     no 10. PREGNANCY: Is there any chance  you are pregnant? When was your last menstrual period?       no  Protocols used: Knee Pain-A-AH

## 2024-05-29 NOTE — Telephone Encounter (Signed)
 Please advise

## 2024-05-29 NOTE — Telephone Encounter (Signed)
 Copied from CRM #[id number]. Topic: General - Other >> May 29, 2024  8:39 AM Joesph NOVAK wrote: Reason for CRM: Patient is calling to request that her provider changes her FMLA paper work from 3 to 5 days.

## 2024-06-09 ENCOUNTER — Inpatient Hospital Stay: Admission: RE | Admit: 2024-06-09 | Source: Ambulatory Visit

## 2024-06-09 ENCOUNTER — Other Ambulatory Visit

## 2024-06-12 NOTE — Telephone Encounter (Signed)
 Received fax for addended FMLA.  Dr. Gasper completed this and faxed to [facility] 06/12/24.

## 2024-06-15 ENCOUNTER — Inpatient Hospital Stay: Admission: RE | Admit: 2024-06-15

## 2024-06-15 ENCOUNTER — Other Ambulatory Visit

## 2024-06-25 ENCOUNTER — Other Ambulatory Visit: Payer: Self-pay

## 2024-06-25 DIAGNOSIS — J4540 Moderate persistent asthma, uncomplicated: Secondary | ICD-10-CM

## 2024-06-25 MED ORDER — ALBUTEROL SULFATE HFA 108 (90 BASE) MCG/ACT IN AERS: INHALATION_SPRAY | RESPIRATORY_TRACT | 5 refills | Status: DC

## 2024-07-25 ENCOUNTER — Other Ambulatory Visit: Payer: Self-pay | Admitting: Family Medicine

## 2024-07-25 DIAGNOSIS — M25561 Pain in right knee: Secondary | ICD-10-CM

## 2024-07-25 DIAGNOSIS — J4540 Moderate persistent asthma, uncomplicated: Secondary | ICD-10-CM

## 2024-07-25 NOTE — Telephone Encounter (Signed)
 Copied from CRM #[id number]. Topic: Clinical - Medication Refill >> Jul 25, 2024 10:22 AM Winona R wrote: Medication:   oxyCODONE -acetaminophen  (PERCOCET) 10-325 MG tablet     Has the patient contacted their pharmacy? Yes contact the provider  (Agent: If no, request that the patient contact the pharmacy for the refill. If patient does not wish to contact the pharmacy document the reason why and proceed with request.) (Agent: If yes, when and what did the pharmacy advise?)  This is the patient's preferred pharmacy:  [id number] - [place], [place] - [address] [place] [address] Phone: [phone] Fax: [phone]  Is this the correct pharmacy for this prescription? Yes If no, delete pharmacy and type the correct one.   Has the prescription been filled recently? Yes  Is the patient out of the medication? Yes  Has the patient been seen for an appointment in the last year OR does the patient have an upcoming appointment? Yes  Can we respond through MyChart? No  Agent: Please be advised that Rx refills may take up to 3 business days. We ask that you follow-up with your pharmacy.

## 2024-07-30 MED ORDER — OXYCODONE-ACETAMINOPHEN 10-325 MG PO TABS: 1.0000 | ORAL_TABLET | Freq: Three times a day (TID) | ORAL | 0 refills | Status: AC | PRN

## 2024-07-30 NOTE — Telephone Encounter (Signed)
 Requested medication (s) are due for refill today: Yes  Requested medication (s) are on the active medication list: Yes  Last refill:  05/29/24  Future visit scheduled: No  Notes to clinic:  Unable to refill per protocol, cannot delegate.      Requested Prescriptions  Pending Prescriptions Disp Refills   oxyCODONE -acetaminophen  (PERCOCET) 10-325 MG tablet 30 tablet 0    Sig: Take 1 tablet by mouth every 8 (eight) hours as needed for pain.     Not Delegated - Analgesics:  Opioid Agonist Combinations Failed - 07/30/2024  2:22 PM      Failed - This refill cannot be delegated      Failed - Urine Drug Screen completed in last 360 days      Failed - Valid encounter within last 3 months    Recent Outpatient Visits           2 months ago Severe persistent asthma with acute exacerbation [facility])   [facility] Franchot Isaiah LABOR, MD   3 months ago Allergic rhinitis due to pollen, unspecified seasonality   [place] [facility], [place], PA-C   4 months ago Chronic cough   [place] [facility] Gasper Nancyann BRAVO, MD   5 months ago Acute pain of right knee   [facility] [place], Jon HERO, MD   7 months ago Strep throat   [facility], Lauraine SAILOR, [place]
